# Patient Record
Sex: Female | Born: 1965 | Race: White | Hispanic: No | Marital: Married | Smoking: Never smoker
Health system: Southern US, Community
[De-identification: ages and names within clinical notes are randomized; demographics above are authoritative.]

## PROBLEM LIST (undated history)

## (undated) DIAGNOSIS — J45909 Unspecified asthma, uncomplicated: Secondary | ICD-10-CM

## (undated) DIAGNOSIS — J189 Pneumonia, unspecified organism: Secondary | ICD-10-CM

## (undated) DIAGNOSIS — I82409 Acute embolism and thrombosis of unspecified deep veins of unspecified lower extremity: Secondary | ICD-10-CM

## (undated) DIAGNOSIS — I10 Essential (primary) hypertension: Secondary | ICD-10-CM

## (undated) DIAGNOSIS — E7800 Pure hypercholesterolemia, unspecified: Secondary | ICD-10-CM

## (undated) HISTORY — DX: Essential (primary) hypertension: I10

## (undated) HISTORY — DX: Pneumonia, unspecified organism: J18.9

## (undated) HISTORY — PX: CHOLECYSTECTOMY: SHX55

## (undated) HISTORY — DX: Unspecified asthma, uncomplicated: J45.909

---

## 2019-10-11 ENCOUNTER — Emergency Department (HOSPITAL_COMMUNITY): Payer: No Typology Code available for payment source

## 2019-10-11 ENCOUNTER — Other Ambulatory Visit: Payer: Self-pay

## 2019-10-11 ENCOUNTER — Encounter (HOSPITAL_COMMUNITY): Payer: Self-pay | Admitting: Emergency Medicine

## 2019-10-11 ENCOUNTER — Emergency Department (HOSPITAL_COMMUNITY)
Admission: EM | Admit: 2019-10-11 | Discharge: 2019-10-11 | Disposition: A | Discharge status: Home / Self Care | Payer: No Typology Code available for payment source | Attending: Emergency Medicine | Admitting: Emergency Medicine

## 2019-10-11 DIAGNOSIS — R0602 Shortness of breath: Secondary | ICD-10-CM | POA: Diagnosis not present

## 2019-10-11 HISTORY — DX: Pure hypercholesterolemia, unspecified: E78.00

## 2019-10-11 HISTORY — DX: Acute embolism and thrombosis of unspecified deep veins of unspecified lower extremity: I82.409

## 2019-10-11 LAB — CBC WITH DIFFERENTIAL/PLATELET
Abs Immature Granulocytes: 0.02 10*3/uL (ref 0.00–0.07)
Basophils Absolute: 0.1 10*3/uL (ref 0.0–0.1)
Basophils Relative: 1 %
Eosinophils Absolute: 0.2 10*3/uL (ref 0.0–0.5)
Eosinophils Relative: 2 %
HCT: 42.8 % (ref 36.0–46.0)
Hemoglobin: 13.5 g/dL (ref 12.0–15.0)
Immature Granulocytes: 0 %
Lymphocytes Relative: 40 %
Lymphs Abs: 3.5 10*3/uL (ref 0.7–4.0)
MCH: 28.1 pg (ref 26.0–34.0)
MCHC: 31.5 g/dL (ref 30.0–36.0)
MCV: 89.2 fL (ref 80.0–100.0)
Monocytes Absolute: 0.5 10*3/uL (ref 0.1–1.0)
Monocytes Relative: 6 %
Neutro Abs: 4.5 10*3/uL (ref 1.7–7.7)
Neutrophils Relative %: 51 %
Platelets: 223 10*3/uL (ref 150–400)
RBC: 4.8 MIL/uL (ref 3.87–5.11)
RDW: 15.1 % (ref 11.5–15.5)
WBC: 8.7 10*3/uL (ref 4.0–10.5)
nRBC: 0 % (ref 0.0–0.2)

## 2019-10-11 LAB — BASIC METABOLIC PANEL
Anion gap: 11 (ref 5–15)
BUN: 14 mg/dL (ref 6–20)
CO2: 24 mmol/L (ref 22–32)
Calcium: 9.1 mg/dL (ref 8.9–10.3)
Chloride: 105 mmol/L (ref 98–111)
Creatinine, Ser: 0.73 mg/dL (ref 0.44–1.00)
GFR calc Af Amer: >60 mL/min (ref >60)
GFR calc non Af Amer: >60 mL/min (ref >60)
Glucose, Bld: 102 mg/dL — ABNORMAL HIGH (ref 70–99)
Potassium: 4 mmol/L (ref 3.5–5.1)
Sodium: 140 mmol/L (ref 135–145)

## 2019-10-11 MED ORDER — IOHEXOL 350 MG/ML SOLN
100.0000 mL | Freq: Once | INTRAVENOUS | Status: AC | PRN
  Administered 2019-10-11: 21:00:00 100 mL via INTRAVENOUS

## 2019-10-11 NOTE — ED Triage Notes (Signed)
Pt dx w/ Covid on Dec 22. Dx with DVT x 2 weeks ago being treated with xarelto. Pt sent by pcp today to r/o PE.  Sats in the upper 80's with ambulation for past couple of days.

## 2019-10-11 NOTE — Discharge Instructions (Addendum)
Your CT scan of your chest this evening was negative for evidence of a blood clot.  Please contact your primary provider to arrange a follow-up appointment this week.  Return to the emergency department if you develop any worsening symptoms

## 2019-10-14 NOTE — ED Provider Notes (Signed)
[facility] EMERGENCY DEPARTMENT Provider Note   CSN: [id number] Arrival date & time: 10/11/19  1544     History Chief Complaint  Patient presents with  . Shortness of Breath    Jane Bennett is a 54 y.o. female.  HPI      Jane Bennett is a 54 y.o. female who presents to the Emergency Department at the recommendation of her PCP for evaluation of a possible PE.  Patient states she was diagnosed with Covid on 09/13/2019.  She was diagnosed with a likely DVT 2 weeks ago by her PCP due to pain in her right ankle and lower leg.  She was started on Xarelto which she is taken daily.  She reports that her Covid symptoms are gradually improving, but she continues to have an elevated heart rate and oxygen saturations that dropped into the upper 80s with ambulation.  She has been monitoring her oxygen levels at home with a portable pulse oximeter.  She states that her oxygen saturations decreased briefly with activity, but quickly returned above 90% at rest.  She denies chest pain, shortness of breath at rest, fever, chills, cough, abdominal pain or diarrhea.  Diagnosis of DVT to the right lower extremity was clinical per patient.  She did not have an ultrasound of the extremity.     Past Medical History:  Diagnosis Date  . DVT (deep venous thrombosis) ([place])   . High cholesterol     There are no problems to display for this patient.   Past Surgical History:  Procedure Laterality Date  . CHOLECYSTECTOMY       OB History   No obstetric history on file.     No family history on file.  Social History   Tobacco Use  . Smoking status: Never Smoker  . Smokeless tobacco: Never Used  Substance Use Topics  . Alcohol use: Never  . Drug use: Never    Home Medications Prior to Admission medications   Not on File    Allergies    Bee venom and Sulfa antibiotics  Review of Systems   Review of Systems  Constitutional: Negative for chills, fatigue and fever.  Respiratory:  Positive for shortness of breath (Shortness of breath with exertion only). Negative for cough and wheezing.   Cardiovascular: Negative for chest pain, palpitations and leg swelling.  Gastrointestinal: Negative for abdominal pain, diarrhea, nausea and vomiting.  Genitourinary: Negative for dysuria, flank pain and hematuria.  Musculoskeletal: Negative for arthralgias, back pain, myalgias, neck pain and neck stiffness.  Skin: Negative for rash.  Neurological: Negative for dizziness, weakness, numbness and headaches.  Hematological: Does not bruise/bleed easily.    Physical Exam Updated Vital Signs BP (!) 148/91   Pulse 86   Temp 98.4 F (36.9 C) (Oral)   Resp 18   Ht 5\' 2"  (1.575 m)   Wt 97.5 kg   SpO2 98%   BMI 39.32 kg/m   Physical Exam Vitals and nursing note reviewed.  Constitutional:      Appearance: Normal appearance. She is well-developed. She is not ill-appearing or toxic-appearing.  HENT:     Head: Normocephalic.  Neck:     Meningeal: Kernig's sign absent.  Cardiovascular:     Rate and Rhythm: Normal rate and regular rhythm.     Pulses: Normal pulses.  Pulmonary:     Effort: Pulmonary effort is normal.     Breath sounds: Normal breath sounds. No wheezing.  Chest:     Chest wall: No tenderness.  Abdominal:     Palpations: Abdomen is soft.     Tenderness: There is no abdominal tenderness. There is no guarding or rebound.  Musculoskeletal:        General: Tenderness present. Normal range of motion.     Cervical back: Normal range of motion.     Comments: Patient has focal tenderness to palpation of the lateral right foot.  Right lower leg is nontender to palpation, no erythema, edema, or excessive warmth.  Negative Homans' sign.  Skin:    General: Skin is warm.     Capillary Refill: Capillary refill takes less than 2 seconds.     Findings: No rash.  Neurological:     Mental Status: She is alert and oriented to person, place, and time.     Sensory: No sensory  deficit.     Motor: No weakness.     ED Results / Procedures / Treatments   Labs (all labs ordered are listed, but only abnormal results are displayed) Labs Reviewed  BASIC METABOLIC PANEL - Abnormal; Notable for the following components:      Result Value   Glucose, Bld 102 (*)    All other components within normal limits  CBC WITH DIFFERENTIAL/PLATELET    EKG None  Radiology CT Angio Chest PE W and/or Wo Contrast  Result Date: 10/11/2019 CLINICAL DATA:  Shortness of breath. EXAM: CT ANGIOGRAPHY CHEST WITH CONTRAST TECHNIQUE: Multidetector CT imaging of the chest was performed using the standard protocol during bolus administration of intravenous contrast. Multiplanar CT image reconstructions and MIPs were obtained to evaluate the vascular anatomy. CONTRAST:  OMNIPAQUE IOHEXOL 350 MG/ML SOLN COMPARISON:  None. FINDINGS: Cardiovascular: Satisfactory opacification of the pulmonary arteries to the segmental level. No evidence of pulmonary embolism. Normal heart size. No pericardial effusion. Mediastinum/Nodes: No enlarged mediastinal, hilar, or axillary lymph nodes. Thyroid gland, trachea, and esophagus demonstrate no significant findings. Lungs/Pleura: Lungs are clear. No pleural effusion or pneumothorax. Upper Abdomen: No acute abnormality. Musculoskeletal: No chest wall abnormality. No acute or significant osseous findings. Review of the MIP images confirms the above findings. IMPRESSION: No CT evidence of pulmonary embolism or acute cardiopulmonary disease. Electronically Signed   By: Aram Candela M.D.   On: 10/11/2019 21:28     Procedures Procedures (including critical care time)  Medications Ordered in ED Medications  iohexol (OMNIPAQUE) 350 MG/ML injection 100 mL (100 mLs Intravenous Contrast Given 10/11/19 2116)    ED Course  I have reviewed the triage vital signs and the nursing notes.  Pertinent labs & imaging results that were available during my care of the  patient were reviewed by me and considered in my medical decision making (see chart for details).    MDM Rules/Calculators/A&P                      Patient diagnosed with Covid 19 in December, sent by PCPs office for evaluation of possible PE due to intermittent hypoxia and tachycardia.  Patient has been anticoagulated on Xarelto for 2 weeks.  She is well-appearing.  No hypoxia here.  CT angio of the chest is negative for PE.  Low clinical suspicion for ACS.  Laboratory studies are reassuring.  I feel patient is appropriate for discharge home, she agrees to close follow-up with PCP to discuss need for continued Xarelto use.  Return precautions also discussed.   Final Clinical Impression(s) / ED Diagnoses Final diagnoses:  Shortness of breath    Rx / DC Orders  ED Discharge Orders    None       Pauline Aus, Cordelia Poche 10/14/19 1301    Raeford Razor, MD 10/14/19 1511

## 2019-11-02 ENCOUNTER — Encounter: Payer: Self-pay | Admitting: *Deleted

## 2019-11-02 ENCOUNTER — Ambulatory Visit (INDEPENDENT_AMBULATORY_CARE_PROVIDER_SITE_OTHER): Payer: No Typology Code available for payment source | Admitting: Pulmonary Disease

## 2019-11-02 ENCOUNTER — Encounter: Payer: Self-pay | Admitting: Pulmonary Disease

## 2019-11-02 ENCOUNTER — Other Ambulatory Visit: Payer: Self-pay

## 2019-11-02 VITALS — BP 138/94 | HR 120 | Temp 97.1°F | Ht 61.0 in | Wt 222.0 lb

## 2019-11-02 DIAGNOSIS — Z8616 Personal history of COVID-19: Secondary | ICD-10-CM

## 2019-11-02 DIAGNOSIS — I82409 Acute embolism and thrombosis of unspecified deep veins of unspecified lower extremity: Secondary | ICD-10-CM | POA: Insufficient documentation

## 2019-11-02 DIAGNOSIS — R5381 Other malaise: Secondary | ICD-10-CM | POA: Insufficient documentation

## 2019-11-02 DIAGNOSIS — R062 Wheezing: Secondary | ICD-10-CM

## 2019-11-02 DIAGNOSIS — J410 Simple chronic bronchitis: Secondary | ICD-10-CM | POA: Diagnosis not present

## 2019-11-02 DIAGNOSIS — J4540 Moderate persistent asthma, uncomplicated: Secondary | ICD-10-CM | POA: Insufficient documentation

## 2019-11-02 DIAGNOSIS — I82491 Acute embolism and thrombosis of other specified deep vein of right lower extremity: Secondary | ICD-10-CM | POA: Diagnosis not present

## 2019-11-02 MED ORDER — ADVAIR HFA 230-21 MCG/ACT IN AERO: 2.0000 | INHALATION_SPRAY | Freq: Two times a day (BID) | RESPIRATORY_TRACT | 12 refills | Status: DC

## 2019-11-02 NOTE — Patient Instructions (Addendum)
History of COVID-19 pneumonia Deconditioning --Please send a copy of your COVID-19 test for our office --START Advair 230-21 mcg TWO puffs TWICE a day --Referral to Pulmonary Rehab --Arrange for pulmonary function tests  Presumed right lower extremity DVT on anticoagulation --Order ultrasound   Upper airway wheezing --Referral to ENT  Follow-up after pulmonary function test with NP

## 2019-11-02 NOTE — Addendum Note (Signed)
Addended by: Boone Master E on: 11/02/2019 02:57 PM   Modules accepted: Orders

## 2019-11-02 NOTE — Progress Notes (Signed)
Subjective:   PATIENT ID: Jane Bennett GENDER: female DOB: Sep 14, 1966, MRN: [id number]   HPI  Chief Complaint  Patient presents with  . Consult    wheezy all the time, takes albuterol every 4 hours, no other inhalers    Reason for Visit: New consult for persistent symptoms following covid-19 pneumonia.   Jane Bennett is a 54yo female with PMH pneumonia with bronchitis one year ago presenting for persistent symptoms following covid-19 pneumonia.   She states prior to covid-19 she had recovered completely from her prior pneumonia last year. 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   Since her diagnosis she has constant wheezing, if she walks 100 ft she gets completely short of breath. She has occasional SOB at rest. She has trouble talking as well. Two weeks ago her sats were dropping to the upper 80s on ambulation, but she has not had them checked recently. She wakes up 2 times per night most nights during the week from sputum in her throat and coughing. Cough is mostly dry. She is using her albuterol inhaler every 4 hours and says it helps sometimes. If she is hot her symptoms are much worse. She is unable to lay flat at night, no current LE edema, no chest pain, no palpitations.   Early in January she was clinically diagnosed with DVT. They were unable to do [place] as she was being treated for covid and still symptomatic. She is not sure how long she will be taking it. The swelling in her legs has resolved.   She has not been back to work as she gets so short of breath walking. She was treated with two different antibiotics, azithromycin and then doxycycline, prednisone taper was also tried twice. She thinks the medications helped, her wheezing did go away, but once they were completed she was back to the same symptoms.  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 She was provided with  adequate PPE.   Progress note 12/31 from PCP Alyssa Allwardt PA-C reviewed. She was started on prednisone taper and given one dose of rocephin then azithromycin for suspicion of developing bacterial pneumonia 2/2 to her covid-19.  Progress note from Alyssa Allwardt PA-C reviewed from 10/13/19. She was started on Xarelto 09/28/19 for suspected DVT although no specific note for diagnosis available. Per note she also went to [facility] and had CT chest around this time which was negative for PE. She was started on second prednisone taper at this time and doxycycline. She states she completed both.   ER note from Dr. Raeford Razor 10/11/19 reviewed. She presented to the [facility] ED with concern for PE with increased HR and O2 requirement. CT showed no PE.   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  Never smoker   Environmental exposures: No known exposures   I have personally reviewed patient's past medical/family/social history, allergies, current medications.  Past Medical History:  Diagnosis Date  . DVT (deep venous thrombosis) ([facility])   . High cholesterol   . Pneumonia      Family History  Problem Relation Age of Onset  . Diabetes Mother   . Hypertension Mother   . Hyperlipidemia Mother   . Asthma Mother   . Diabetes Father   . Hypertension Father   . Deep vein thrombosis Father      Social History  Occupational History  . Not on file  Tobacco Use  . Smoking status: Never Smoker  . Smokeless tobacco: Never Used  Substance and Sexual Activity  . Alcohol use: Never  . Drug use: Never  . Sexual activity: Not on file    Allergies  Allergen Reactions  . Bee Venom Hives  . Sulfa Antibiotics Hives     Outpatient Medications Prior to Visit  Medication Sig Dispense Refill  . rivaroxaban (XARELTO) 20 MG TABS tablet Take 20 mg by mouth daily with supper.    Marland Kitchen albuterol (VENTOLIN HFA) 108 (90 Base) MCG/ACT inhaler INHALE 1 TO 2 PUFFS BY MOUTH EVERY  4 TO 6 HOURS    . ondansetron (ZOFRAN-ODT) 8 MG disintegrating tablet Take 8 mg by mouth every 6 (six) hours as needed.     No facility-administered medications prior to visit.    Review of Systems  Constitutional: Positive for malaise/fatigue. Negative for chills, fever and weight loss.  HENT: Positive for sore throat. Negative for congestion and sinus pain.   Respiratory: Positive for cough, shortness of breath and wheezing. Negative for hemoptysis and sputum production.   Cardiovascular: Negative for chest pain, palpitations and leg swelling.  Gastrointestinal: Negative for abdominal pain and nausea.  Neurological: Negative for dizziness, weakness and headaches.   Objective:   Vitals:   11/02/19 0942  BP: (!) 138/94  Pulse: (!) 120  Temp: (!) 97.1 F (36.2 C)  TempSrc: Temporal  SpO2: 97%  Weight: 222 lb (100.7 kg)  Height: 5\' 1"  (1.549 m)   SpO2: 97 % O2 Device: None (Room air)  Physical Exam: General: no acute distress, obese HENT: , AT, OP clear, MMM Eyes: EOMI, no scleral icterus Respiratory: Audible wheezing, bilateral wheezing, no crackles or rales Cardiovascular: RRR, -M/R/G, no JVD Extremities:-Edema,-tenderness Neuro: AAO x4, CNII-XII grossly intact Skin: Intact, no rashes or bruising Psych: Normal mood, normal affect  Data Reviewed:  Imaging: CTA 1/19 2021: Negative for pulmonary embolism. Faint bilateral patchy groundglass opacities without evidence of interstitial thickening, effusion or edema.  PFT: None on file    Ambulatory O2 11/02/19  11/02/19    1036  Resting   Supplemental oxygen during test? No  Resting Heart Rate 105  Resting Sp02 97  Lap 1 (250 feet)   HR 121  02 Sat 95  Lap 2 (250 feet)   HR --  02 Sat --  Lap 3 (250 feet)   HR --  02 Sat --  Tech Comments: Patient walked at an average pace only able to complete one lap. Patient stopped three times while doing the lap to cough and to catch her breath. Patient had  complaints of severe SOB and chest tightness and also wheezed the entire time while walking.    Assessment & Plan:  Discussion: Jane Bennett is a 54yo previously healthy female. She is a never smoker with hx of pneumonia/bronchitis last year status post complete resolution. She presents to Pulmonary clinic for evaluation of persistent dyspnea on exertion and wheezing following diagnosis of COVID-19 pneumonia in 08/2019. She has completed two steroid tapers and been treated with antibiotics twice without resolution of symptoms. Her symptoms limit her activities of daily living. CT chest reviewed as above. She was also diagnosed with DVT clinically, thought to be secondary to covid-19, and is currently on Xarelto.   Of note, patient arrived with case manager involved in worker's comp case. Patient declined having case manager during interview however allowed representative to be present during assessment  and plan.  Chronic Bronchitis  Covid-19 Persistent symptoms Ambulate with O2 sats PFTs end of March Follow-up after PFTs Refer to pulmonary rehab  Refer to ENT with upper airway wheezing Start ICS/LABA  DVT  Perform LE doppler    Health Maintenance Immunization History  Administered Date(s) Administered  . Influenza Whole 07/04/2019   CT Lung Screen - na  Orders Placed This Encounter  Procedures  . Ambulatory referral to ENT    Referral Priority:   Routine    Referral Type:   Consultation    Referral Reason:   Specialty Services Required    Requested Specialty:   Otolaryngology    Number of Visits Requested:   1  . AMB referral to pulmonary rehabilitation    Referral Priority:   Routine    Referral Type:   Consultation    Number of Visits Requested:   1  . Pulmonary function test    Standing Status:   Future    Standing Expiration Date:   11/01/2020    Order Specific Question:   Where should this test be performed?    Answer:   [facility]    Order Specific Question:    Full PFT: includes the following: basic spirometry, spirometry pre & post bronchodilator, diffusion capacity (DLCO), lung volumes    Answer:   Full PFT   Meds ordered this encounter  Medications  . fluticasone-salmeterol (ADVAIR HFA) 230-21 MCG/ACT inhaler    Sig: Inhale 2 puffs into the lungs 2 (two) times daily.    Dispense:  1 Inhaler    Refill:  12    Return for after PFTs with NP.  Marty Heck, MD [facility] 11/02/2019 10:32 AM  Office Number [phone]  Attending Attestation I have taken an interval history, reviewed the chart and examined the patient myself. I have discussed patient's case and agree with the resident's note, impression, and recommendations as outlined.  I have independently spent a total time of 60-minutes related to patient care.  54 year female never smoker with hx of bronchitis/pneumonia last year but usually in general healthy. No hx of childhood asthma/respiratory illness. 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 She wears gloves, gowns, faceshields and X52W per work policy since the pandemic started. 09/12/20 She reports +COVID at work. She was asymptomatic. Two days following she developed multitude of GI and respiratory symptoms and still has persistent DOE and wheezing which limit her ADLs.  She was clinically diagnosed with LLE DVT and started on anticoagulation January 6th. Denies bleeding or wheezing. On my exam, wheezing in upper airways, diminished airway entry with no rhonchi in lung fields. Cardiac exam RRR, no murmur or pedal edema/tenderness.  Assessment/Plan  History of COVID-19 pneumonia Deconditioning --Please send a copy of your COVID-19 test for our office --START Advair 230-21 mcg TWO puffs TWICE a day --Referral to Pulmonary Rehab --Arrange for pulmonary function tests  Presumed right lower extremity DVT on anticoagulation --Order ultrasound   Upper airway wheezing --Referral  to ENT  Rodman Pickle, M.D. [facility] Pulmonary/Critical Care Medicine 11/02/2019 10:56 AM

## 2019-11-04 ENCOUNTER — Other Ambulatory Visit: Payer: Self-pay

## 2019-11-04 ENCOUNTER — Ambulatory Visit (HOSPITAL_COMMUNITY)
Admission: RE | Admit: 2019-11-04 | Discharge: 2019-11-04 | Disposition: A | Discharge status: Home / Self Care | Payer: No Typology Code available for payment source | Source: Ambulatory Visit | Attending: Pulmonary Disease | Admitting: Pulmonary Disease

## 2019-11-04 DIAGNOSIS — I82491 Acute embolism and thrombosis of other specified deep vein of right lower extremity: Secondary | ICD-10-CM | POA: Diagnosis present

## 2019-11-04 NOTE — Progress Notes (Signed)
I called patient regarding negative right lower extremity doppler. This study along with the CTA on 10/11/19 is negative for PE/DVT. I advised patient to discontinue xarelto due to increased risk of bleeding. Patient expressed understanding and agreed to plan.  Please fax this note to patient's PCP office and doppler results.  Mechele Collin, M.D. [facility] Pulmonary/Critical Care Medicine 11/04/2019 4:20 PM

## 2019-11-16 ENCOUNTER — Other Ambulatory Visit: Payer: Self-pay

## 2019-11-16 ENCOUNTER — Encounter (HOSPITAL_COMMUNITY): Payer: Self-pay

## 2019-11-16 ENCOUNTER — Encounter (HOSPITAL_COMMUNITY)
Admission: RE | Admit: 2019-11-16 | Discharge: 2019-11-16 | Disposition: A | Discharge status: Home / Self Care | Payer: No Typology Code available for payment source | Source: Ambulatory Visit | Attending: Pulmonary Disease | Admitting: Pulmonary Disease

## 2019-11-16 VITALS — BP 170/98 | HR 83 | Temp 96.6°F | Ht 62.0 in | Wt 224.5 lb

## 2019-11-16 DIAGNOSIS — R0602 Shortness of breath: Secondary | ICD-10-CM | POA: Diagnosis present

## 2019-11-16 DIAGNOSIS — J410 Simple chronic bronchitis: Secondary | ICD-10-CM | POA: Insufficient documentation

## 2019-11-16 NOTE — Progress Notes (Signed)
Daily Session Note  Patient Details  Name: Jane Bennett MRN: [id number] Date of Birth: Jan 22, 1966 Referring Provider:     PULMONARY REHAB OTHER RESP ORIENTATION from 11/16/2019 in [place]  Referring Provider  Dr. Loanne Drilling      Encounter Date: 11/16/2019  Check In: Session Check In - 11/16/19 0800      Check-In   Supervising physician immediately available to respond to emergencies  See telemetry face sheet for immediately available MD    Location  [facility]    Staff Present  [name], MS, EP,  [facility], Exercise Physiologist;Debra [name], RN, BSN    Virtual Visit  No    Medication changes reported      No    Fall or balance concerns reported     No    Tobacco Cessation  No Change    Warm-up and Cool-down  Performed as group-led instruction    Resistance Training Performed  Yes    VAD Patient?  No    PAD/SET Patient?  No      Pain Assessment   Currently in Pain?  No/denies    Pain Score  0-No pain    Multiple Pain Sites  No       Capillary Blood Glucose: No results found for this or any previous visit (from the past 24 hour(s)).    Social History   Tobacco Use  Smoking Status Never Smoker  Smokeless Tobacco Never Used    Goals Met:  Proper associated with RPD/PD & O2 Sat Independence with exercise equipment Exercise tolerated well Personal goals reviewed Queuing for purse lip breathing No report of cardiac concerns or symptoms Strength training completed today  Goals Unmet:  Not Applicable  Comments: Check out 10:15   Dr. Kate Sable is Medical Director for [place] and Pulmonary Rehab.

## 2019-11-16 NOTE — Progress Notes (Signed)
Pulmonary Individual Treatment Plan  Patient Details  Name: Jane Bennett MRN: [id number] Date of Birth: 1966/07/19 Referring Provider:     PULMONARY REHAB OTHER RESP ORIENTATION from 11/16/2019 in [facility] CARDIAC REHABILITATION  Referring Provider  Dr. Everardo All      Initial Encounter Date:    PULMONARY REHAB OTHER RESP ORIENTATION from 11/16/2019 in [facility] CARDIAC REHABILITATION  Date  11/16/19      Visit Diagnosis: SOB (shortness of breath)  Simple chronic bronchitis (HCC)  Patient's Home Medications on Admission:   Current Outpatient Medications:  .  albuterol (VENTOLIN HFA) 108 (90 Base) MCG/ACT inhaler, INHALE 1 TO 2 PUFFS BY MOUTH EVERY 4 TO 6 HOURS, Disp: , Rfl:  .  fluticasone-salmeterol (ADVAIR HFA) 230-21 MCG/ACT inhaler, Inhale 2 puffs into the lungs 2 (two) times daily., Disp: 1 Inhaler, Rfl: 12 .  ondansetron (ZOFRAN-ODT) 8 MG disintegrating tablet, Take 8 mg by mouth every 6 (six) hours as needed., Disp: , Rfl:  .  rivaroxaban (XARELTO) 20 MG TABS tablet, Take 20 mg by mouth daily with supper., Disp: , Rfl:   Past Medical History: Past Medical History:  Diagnosis Date  . DVT (deep venous thrombosis) (HCC)   . High cholesterol   . Pneumonia     Tobacco Use: Social History   Tobacco Use  Smoking Status Never Smoker  Smokeless Tobacco Never Used    Labs: Recent Review Flowsheet Data    There is no flowsheet data to display.      Capillary Blood Glucose: No results found for: GLUCAP   Pulmonary Assessment Scores: Pulmonary Assessment Scores    Row Name 11/16/19 1117         ADL UCSD   ADL Phase  Entry     SOB Score total  90     Rest  0     Walk  11     Stairs  5     Bath  4     Dress  5     Shop  5       CAT Score   CAT Score  33       mMRC Score   mMRC Score  4       UCSD: Self-administered rating of dyspnea associated with activities of daily living (ADLs) 6-point scale (0 = "not at all" to 5 = "maximal or unable to do  because of breathlessness")  Scoring Scores range from 0 to 120.  Minimally important difference is 5 units  CAT: CAT can identify the health impairment of COPD patients and is better correlated with disease progression.  CAT has a scoring range of zero to 40. The CAT score is classified into four groups of low (less than 10), medium (10 - 20), high (21-30) and very high (31-40) based on the impact level of disease on health status. A CAT score over 10 suggests significant symptoms.  A worsening CAT score could be explained by an exacerbation, poor medication adherence, poor inhaler technique, or progression of COPD or comorbid conditions.  CAT MCID is 2 points  mMRC: mMRC (Modified Medical Research Council) Dyspnea Scale is used to assess the degree of baseline functional disability in patients of respiratory disease due to dyspnea. No minimal important difference is established. A decrease in score of 1 point or greater is considered a positive change.   Pulmonary Function Assessment:   Exercise Target Goals: Exercise Program Goal: Individual exercise prescription set using results from initial 6 min walk  test and THRR while considering  patient's activity barriers and safety.   Exercise Prescription Goal: Initial exercise prescription builds to 30-45 minutes a day of aerobic activity, 2-3 days per week.  Home exercise guidelines will be given to patient during program as part of exercise prescription that the participant will acknowledge.  Activity Barriers & Risk Stratification: Activity Barriers & Cardiac Risk Stratification - 11/16/19 1103      Activity Barriers & Cardiac Risk Stratification   Activity Barriers  Shortness of Breath    Cardiac Risk Stratification  Moderate       6 Minute Walk: 6 Minute Walk    Row Name 11/16/19 1058         6 Minute Walk   Phase  Initial     Distance  700 feet     Walk Time  6 minutes     # of Rest Breaks  1     MPH  1.32     METS   2.01     RPE  14     Perceived Dyspnea   16     VO2 Peak  0.56     Symptoms  Yes (comment)     Comments  Extreme SOB. Had to stop to rest for 2 minutes then was able to finish the test.     Resting HR  83 bpm     Resting BP  170/98     Resting Oxygen Saturation   97 %     Exercise Oxygen Saturation  during 6 min walk  96 %     Max Ex. HR  107 bpm     Max Ex. BP  198/120     2 Minute Post BP  158/98        Oxygen Initial Assessment: Oxygen Initial Assessment - 11/16/19 1116      Home Oxygen   Home Oxygen Device  None    Sleep Oxygen Prescription  None    Home Exercise Oxygen Prescription  None    Home at Rest Exercise Oxygen Prescription  None      Initial 6 min Walk   Oxygen Used  None      Program Oxygen Prescription   Program Oxygen Prescription  None       Oxygen Re-Evaluation:   Oxygen Discharge (Final Oxygen Re-Evaluation):   Initial Exercise Prescription: Initial Exercise Prescription - 11/16/19 1100      Date of Initial Exercise RX and Referring Provider   Date  11/16/19    Referring Provider  Dr. Everardo All    Expected Discharge Date  02/13/20      Arm Ergometer   Level  1    Watts  7    RPM  30    Minutes  17    METs  1.5      T5 Nustep   Level  1    SPM  49    Minutes  22    METs  1.8      Prescription Details   Frequency (times per week)  2    Duration  Progress to 30 minutes of continuous aerobic without signs/symptoms of physical distress      Intensity   THRR 40-80% of Max Heartrate  97-133-150    Ratings of Perceived Exertion  11-13    Perceived Dyspnea  0-4      Progression   Progression  Continue to progress workloads to maintain intensity without signs/symptoms of physical distress.  Resistance Training   Training Prescription  Yes    Weight  1    Reps  10-15       Perform Capillary Blood Glucose checks as needed.  Exercise Prescription Changes:   Exercise Comments:  Exercise Comments    Row Name 11/16/19 1109            Exercise Comments  Today was patients inital assessment/orientation. She did well considering her residual effects of SOB and Wheezing post COVID-19. She is eager and wants to attend so that she can improve and decrease her SOB.          Exercise Goals and Review:  Exercise Goals    Row Name 11/16/19 1108             Exercise Goals   Increase Physical Activity  Yes       Intervention  Provide advice, education, support and counseling about physical activity/exercise needs.;Develop an individualized exercise prescription for aerobic and resistive training based on initial evaluation findings, risk stratification, comorbidities and participant's personal goals.       Expected Outcomes  Short Term: Attend rehab on a regular basis to increase amount of physical activity.;Long Term: Add in home exercise to make exercise part of routine and to increase amount of physical activity.       Increase Strength and Stamina  Yes       Intervention  Provide advice, education, support and counseling about physical activity/exercise needs.;Develop an individualized exercise prescription for aerobic and resistive training based on initial evaluation findings, risk stratification, comorbidities and participant's personal goals.       Expected Outcomes  Short Term: Perform resistance training exercises routinely during rehab and add in resistance training at home;Long Term: Improve cardiorespiratory fitness, muscular endurance and strength as measured by increased METs and functional capacity ( )       Able to understand and use rate of perceived exertion (RPE) scale  Yes       Intervention  Provide education and explanation on how to use RPE scale       Expected Outcomes  Short Term: Able to use RPE daily in rehab to express subjective intensity level;Long Term:  Able to use RPE to guide intensity level when exercising independently       Able to understand and use Dyspnea scale  Yes        Intervention  Provide education and explanation on how to use Dyspnea scale       Expected Outcomes  Short Term: Able to use Dyspnea scale daily in rehab to express subjective sense of shortness of breath during exertion;Long Term: Able to use Dyspnea scale to guide intensity level when exercising independently       Knowledge and understanding of Target Heart Rate Range (THRR)  Yes       Intervention  Provide education and explanation of THRR including how the numbers were predicted and where they are located for reference       Expected Outcomes  Short Term: Able to use daily as guideline for intensity in rehab;Long Term: Able to use THRR to govern intensity when exercising independently       Able to check pulse independently  Yes       Intervention  Provide education and demonstration on how to check pulse in carotid and radial arteries.;Review the importance of being able to check your own pulse for safety during independent exercise       Expected Outcomes  Short Term: Able to explain why pulse checking is important during independent exercise;Long Term: Able to check pulse independently and accurately       Understanding of Exercise Prescription  Yes       Intervention  Provide education, explanation, and written materials on patient's individual exercise prescription       Expected Outcomes  Short Term: Able to explain program exercise prescription;Long Term: Able to explain home exercise prescription to exercise independently          Exercise Goals Re-Evaluation :   Discharge Exercise Prescription (Final Exercise Prescription Changes):   Nutrition:  Target Goals: Understanding of nutrition guidelines, daily intake of sodium 1500mg , cholesterol 200mg , calories 30% from fat and 7% or less from saturated fats, daily to have 5 or more servings of fruits and vegetables.  Biometrics: Pre Biometrics - 11/16/19 1111      Pre Biometrics   Height  5\' 2"  (1.575 m)    Weight  224 lb 8 oz  (101.8 kg)    Waist Circumference  44 inches    Hip Circumference  45.5 inches    Waist to Hip Ratio  0.97 %    BMI (Calculated)  41.05    Triceps Skinfold  26 mm    % Body Fat  48.4 %    Grip Strength  20.8 kg    Flexibility  7.6 in    Single Leg Stand  25 seconds        Nutrition Therapy Plan and Nutrition Goals: Nutrition Therapy & Goals - 11/16/19 1123      Nutrition Therapy   RD appointment deferred  Yes      Personal Nutrition Goals   Personal Goal #2  Patient is eating heart healthy. She eats very little meat. She eats more fruits and vegetables.    Additional Goals?  No      Intervention Plan   Intervention  Nutrition handout(s) given to patient.       Nutrition Assessments: Nutrition Assessments - 11/16/19 1124      MEDFICTS Scores   Pre Score  33       Nutrition Goals Re-Evaluation:   Nutrition Goals Discharge (Final Nutrition Goals Re-Evaluation):   Psychosocial: Target Goals: Acknowledge presence or absence of significant depression and/or stress, maximize coping skills, provide positive support system. Participant is able to verbalize types and ability to use techniques and skills needed for reducing stress and depression.  Initial Review & Psychosocial Screening: Initial Psych Review & Screening - 11/16/19 1120      Initial Review   Current issues with  Current Anxiety/Panic   Due to not being able to breath post [place]?  Yes      Barriers   Psychosocial barriers to participate in program  There are no identifiable barriers or psychosocial needs.      Screening Interventions   Interventions  Encouraged to exercise       Quality of Life Scores: Quality of Life - 11/16/19 1121      Quality of Life   Select  Quality of Life      Quality of Life Scores   Health/Function Pre  20.88 %    Socioeconomic Pre  20.25 %    Psych/Spiritual Pre  20.86 %    Family Pre  20.8 %    GLOBAL Pre  20.72 %       Scores of 19 and below usually  indicate a poorer quality of life in these areas.  A difference of  2-3 points is a clinically meaningful difference.  A difference of 2-3 points in the total score of the Quality of Life Index has been associated with significant improvement in overall quality of life, self-image, physical symptoms, and general health in studies assessing change in quality of life.   PHQ-9: Recent Review Flowsheet Data    Depression screen [facility] 2/9 11/16/2019   Decreased Interest 0   Down, Depressed, Hopeless 0   PHQ - 2 Score 0   Altered sleeping 1   Tired, decreased energy 2   Change in appetite 1   Feeling bad or failure about yourself  0   Trouble concentrating 0   Moving slowly or fidgety/restless 0   Suicidal thoughts 0   PHQ-9 Score 4   Difficult doing work/chores Somewhat difficult     Interpretation of Total Score  Total Score Depression Severity:  1-4 = Minimal depression, 5-9 = Mild depression, 10-14 = Moderate depression, 15-19 = Moderately severe depression, 20-27 = Severe depression   Psychosocial Evaluation and Intervention: Psychosocial Evaluation - 11/16/19 1121      Psychosocial Evaluation & Interventions   Interventions  Encouraged to exercise with the program and follow exercise prescription    Continue Psychosocial Services   No Follow up required       Psychosocial Re-Evaluation:   Psychosocial Discharge (Final Psychosocial Re-Evaluation):    Education: Education Goals: Education classes will be provided on a weekly basis, covering required topics. Participant will state understanding/return demonstration of topics presented.  Learning Barriers/Preferences: Learning Barriers/Preferences - 11/16/19 1004      Learning Barriers/Preferences   Learning Barriers  None    Learning Preferences  Pictoral;Group Instruction;Video;Individual Instruction;Skilled Demonstration       Education Topics: How Lungs Work and Diseases: -  Discuss the anatomy of the lungs and diseases that can affect the lungs, such as COPD.   Exercise: -Discuss the importance of exercise, FITT principles of exercise, normal and abnormal responses to exercise, and how to exercise safely.   Environmental Irritants: -Discuss types of environmental irritants and how to limit exposure to environmental irritants.   Meds/Inhalers and oxygen: - Discuss respiratory medications, definition of an inhaler and oxygen, and the proper way to use an inhaler and oxygen.   Energy Saving Techniques: - Discuss methods to conserve energy and decrease shortness of breath when performing activities of daily living.    Bronchial Hygiene / Breathing Techniques: - Discuss breathing mechanics, pursed-lip breathing technique,  proper posture, effective ways to clear airways, and other functional breathing techniques   Cleaning Equipment: - Provides group verbal and written instruction about the health risks of elevated stress, cause of high stress, and healthy ways to reduce stress.   Nutrition I: Fats: - Discuss the types of cholesterol, what cholesterol does to the body, and how cholesterol levels can be controlled.   Nutrition II: Labels: -Discuss the different components of food labels and how to read food labels.   Respiratory Infections: - Discuss the signs and symptoms of respiratory infections, ways to prevent respiratory infections, and the importance of seeking medical treatment when having a respiratory infection.   Stress I: Signs and Symptoms: - Discuss the causes of stress, how stress may lead to anxiety and depression, and ways to limit stress.   Stress II: Relaxation: -Discuss relaxation techniques to limit stress.   Oxygen for Home/Travel: - Discuss how to prepare for travel when  on oxygen and proper ways to transport and store oxygen to ensure safety.   Knowledge Questionnaire Score: Knowledge Questionnaire Score - 11/16/19  1005      Knowledge Questionnaire Score   Pre Score  15/18       Core Components/Risk Factors/Patient Goals at Admission: Personal Goals and Risk Factors at Admission - 11/16/19 1125      Core Components/Risk Factors/Patient Goals on Admission    Weight Management  Weight Maintenance    Personal Goal Other  Yes    Personal Goal  Breath better, Be able to get back to normal ADL's, Get back to her daily life and to be able to return to work.    Intervention  Attend program 2 x week and to supplement with 3 x week home exercise plan.    Expected Outcomes  Reach expected goals.       Core Components/Risk Factors/Patient Goals Review:    Core Components/Risk Factors/Patient Goals at Discharge (Final Review):    ITP Comments: ITP Comments    Row Name 11/16/19 1027           ITP Comments  Patient is coming to use post COVID-19 with pneumonia which has left her extremely SOB on exertion. Patient is eager to get started.          Comments: Patient arrived for 1st visit/orientation/education at 0800.  Patient was referred to PR by Dr. Everardo AllEllison due to SOB/Wheezing (R06.2) and Chronic Bronchitis (J41.0). During orientation advised patient on arrival and appointment times what to wear, what to do before, during and after exercise. Reviewed attendance and class policy. Talked about inclement weather and class consultation policy. Pt is scheduled to return Pulmonary Rehab on 11/22/2019 at 1:30. Pt was advised to come to class 15 minutes before class starts. Patient was also given instructions on meeting with the dietician and attending the Family Structure classes. Discussed RPE/Dpysnea scales. Discussed initial THR and how to find their radial and/or carotid pulse. Discussed the initial exercise prescription and how this effects their progress. Pt is eager to get started. Patient participated in warm up stretches followed by light weights and resistance bands. Patient was able to complete 6 minute  walk test. Patient had to stop and rest one time during the walk test for 2 minutes. Patient did not c/o pain at all.  Patient was measured for the equipment. Discussed equipment safety with patient. Took patient pre-anthropometric measurements. Patient finished visit at 10:15.

## 2019-11-16 NOTE — Progress Notes (Signed)
Cardiac/Pulmonary Rehab Medication Review by a Pharmacist  Does the patient  feel that his/her medications are working for him/her?  yes  Has the patient been experiencing any side effects to the medications prescribed?  no  Does the patient measure his/her own blood pressure or blood glucose at home?  no   Does the patient have any problems obtaining medications due to transportation or finances?   no  Understanding of regimen: excellent Understanding of indications: excellent Potential of compliance: excellent  Questions asked to Determine Patient Understanding of Medication Regimen:  1. What is the name of the medication?  2. What is the medication used for?  3. When should it be taken?  4. How much should be taken?  5. How will you take it?  6. What side effects should you report?  Understanding Defined as: Excellent: All questions above are correct Good: Questions 1-4 are correct Fair: Questions 1-2 are correct  Poor: 1 or none of the above questions are correct   Pharmacist comments: Jane Bennett presented today for pulmonary rehab.She is recovered  COVID patient from Sep 12, 2020. She had a clinically diagnosed DVT.  The DVT is resolved and treatment is complete with he was treated for and  resolved post repeat US. We reviewed her medications. She is tolerating the [facility] without any problems. Just ready to get Bennett to normal, but she is worried due to the scarring on the xrays.  Thanks for the opportunity to participate in the care of this patient,  Jane Bennett, Jane Bennett, [place] Clinical Pharmacist Pager [phone] 11/16/2019 8:45 AM

## 2019-11-22 ENCOUNTER — Other Ambulatory Visit: Payer: Self-pay

## 2019-11-22 ENCOUNTER — Encounter (HOSPITAL_COMMUNITY)
Admission: RE | Admit: 2019-11-22 | Discharge: 2019-11-22 | Disposition: A | Discharge status: Home / Self Care | Payer: No Typology Code available for payment source | Source: Ambulatory Visit | Attending: Pulmonary Disease | Admitting: Pulmonary Disease

## 2019-11-22 DIAGNOSIS — J410 Simple chronic bronchitis: Secondary | ICD-10-CM | POA: Insufficient documentation

## 2019-11-22 DIAGNOSIS — R0602 Shortness of breath: Secondary | ICD-10-CM | POA: Insufficient documentation

## 2019-11-22 NOTE — Progress Notes (Signed)
Daily Session Note  Patient Details  Name: Jane Bennett MRN: [id number] Date of Birth: Dec 16, 1965 Referring Provider:     PULMONARY REHAB OTHER RESP ORIENTATION from 11/16/2019 in [employer]  Referring Provider  Dr. Loanne Drilling      Encounter Date: 11/22/2019  Check In: Session Check In - 11/22/19 1330      Check-In   Supervising physician immediately available to respond to emergencies  See telemetry face sheet for immediately available MD    Location  [facility]    Staff Present  [name], MS, EP, [facility], Exercise Physiologist;Other    Virtual Visit  No    Medication changes reported      No    Fall or balance concerns reported     No    Tobacco Cessation  No Change    Warm-up and Cool-down  Performed as group-led instruction    Resistance Training Performed  Yes    VAD Patient?  No    PAD/SET Patient?  No      Pain Assessment   Currently in Pain?  No/denies    Pain Score  0-No pain    Multiple Pain Sites  No       Capillary Blood Glucose: No results found for this or any previous visit (from the past 24 hour(s)).    Social History   Tobacco Use  Smoking Status Never Smoker  Smokeless Tobacco Never Used    Goals Met:  Proper associated with RPD/PD & O2 Sat Independence with exercise equipment Improved SOB with ADL's Using PLB without cueing & demonstrates good technique Exercise tolerated well Personal goals reviewed No report of cardiac concerns or symptoms Strength training completed today  Goals Unmet:  Not Applicable  Comments: check out 14:30   Dr. Kate Sable is Medical Director for [facility] and Pulmonary Rehab.

## 2019-11-24 ENCOUNTER — Other Ambulatory Visit: Payer: Self-pay

## 2019-11-24 ENCOUNTER — Encounter (HOSPITAL_COMMUNITY)
Admission: RE | Admit: 2019-11-24 | Discharge: 2019-11-24 | Disposition: A | Discharge status: Home / Self Care | Payer: No Typology Code available for payment source | Source: Ambulatory Visit | Attending: Pulmonary Disease | Admitting: Pulmonary Disease

## 2019-11-24 DIAGNOSIS — R0602 Shortness of breath: Secondary | ICD-10-CM

## 2019-11-24 DIAGNOSIS — J410 Simple chronic bronchitis: Secondary | ICD-10-CM

## 2019-11-24 NOTE — Progress Notes (Signed)
Daily Session Note  Patient Details  Name: Jane Bennett MRN: [id number] Date of Birth: 1966/04/27 Referring Provider:     PULMONARY REHAB OTHER RESP ORIENTATION from 11/16/2019 in [place]  Referring Provider  Dr. Loanne Drilling      Encounter Date: 11/24/2019  Check In: Session Check In - 11/24/19 1330      Check-In   Supervising physician immediately available to respond to emergencies  See telemetry face sheet for immediately available MD    Location  [facility]    Staff Present  [name], MS, EP, [facility], Exercise Physiologist;Amerika Nourse [name], RN, BSN    Virtual Visit  No    Medication changes reported      No    Fall or balance concerns reported     No    Tobacco Cessation  No Change    Warm-up and Cool-down  Performed as group-led instruction    Resistance Training Performed  Yes    VAD Patient?  No    PAD/SET Patient?  No      Pain Assessment   Currently in Pain?  No/denies    Pain Score  0-No pain    Multiple Pain Sites  No       Capillary Blood Glucose: No results found for this or any previous visit (from the past 24 hour(s)).    Social History   Tobacco Use  Smoking Status Never Smoker  Smokeless Tobacco Never Used    Goals Met:  Proper associated with RPD/PD & O2 Sat Independence with exercise equipment Improved SOB with ADL's Using PLB without cueing & demonstrates good technique Exercise tolerated well No report of cardiac concerns or symptoms Strength training completed today  Goals Unmet:  Not Applicable  Comments: Check out 1430.   Dr. Kate Sable is Medical Director for [facility] Cardiac and Pulmonary Rehab.

## 2019-11-24 NOTE — Progress Notes (Signed)
Pulmonary Individual Treatment Plan  Patient Details  Name: Jane Bennett MRN: [id number] Date of Birth: 1966/07/19 Referring Provider:     PULMONARY REHAB OTHER RESP ORIENTATION from 11/16/2019 in [facility] CARDIAC REHABILITATION  Referring Provider  Dr. Everardo All      Initial Encounter Date:    PULMONARY REHAB OTHER RESP ORIENTATION from 11/16/2019 in [facility] CARDIAC REHABILITATION  Date  11/16/19      Visit Diagnosis: SOB (shortness of breath)  Simple chronic bronchitis (HCC)  Patient's Home Medications on Admission:   Current Outpatient Medications:  .  albuterol (VENTOLIN HFA) 108 (90 Base) MCG/ACT inhaler, INHALE 1 TO 2 PUFFS BY MOUTH EVERY 4 TO 6 HOURS, Disp: , Rfl:  .  fluticasone-salmeterol (ADVAIR HFA) 230-21 MCG/ACT inhaler, Inhale 2 puffs into the lungs 2 (two) times daily., Disp: 1 Inhaler, Rfl: 12 .  ondansetron (ZOFRAN-ODT) 8 MG disintegrating tablet, Take 8 mg by mouth every 6 (six) hours as needed., Disp: , Rfl:  .  rivaroxaban (XARELTO) 20 MG TABS tablet, Take 20 mg by mouth daily with supper., Disp: , Rfl:   Past Medical History: Past Medical History:  Diagnosis Date  . DVT (deep venous thrombosis) (HCC)   . High cholesterol   . Pneumonia     Tobacco Use: Social History   Tobacco Use  Smoking Status Never Smoker  Smokeless Tobacco Never Used    Labs: Recent Review Flowsheet Data    There is no flowsheet data to display.      Capillary Blood Glucose: No results found for: GLUCAP   Pulmonary Assessment Scores: Pulmonary Assessment Scores    Row Name 11/16/19 1117         ADL UCSD   ADL Phase  Entry     SOB Score total  90     Rest  0     Walk  11     Stairs  5     Bath  4     Dress  5     Shop  5       CAT Score   CAT Score  33       mMRC Score   mMRC Score  4       UCSD: Self-administered rating of dyspnea associated with activities of daily living (ADLs) 6-point scale (0 = "not at all" to 5 = "maximal or unable to do  because of breathlessness")  Scoring Scores range from 0 to 120.  Minimally important difference is 5 units  CAT: CAT can identify the health impairment of COPD patients and is better correlated with disease progression.  CAT has a scoring range of zero to 40. The CAT score is classified into four groups of low (less than 10), medium (10 - 20), high (21-30) and very high (31-40) based on the impact level of disease on health status. A CAT score over 10 suggests significant symptoms.  A worsening CAT score could be explained by an exacerbation, poor medication adherence, poor inhaler technique, or progression of COPD or comorbid conditions.  CAT MCID is 2 points  mMRC: mMRC (Modified Medical Research Council) Dyspnea Scale is used to assess the degree of baseline functional disability in patients of respiratory disease due to dyspnea. No minimal important difference is established. A decrease in score of 1 point or greater is considered a positive change.   Pulmonary Function Assessment:   Exercise Target Goals: Exercise Program Goal: Individual exercise prescription set using results from initial 6 min walk  test and THRR while considering  patient's activity barriers and safety.   Exercise Prescription Goal: Initial exercise prescription builds to 30-45 minutes a day of aerobic activity, 2-3 days per week.  Home exercise guidelines will be given to patient during program as part of exercise prescription that the participant will acknowledge.  Activity Barriers & Risk Stratification: Activity Barriers & Cardiac Risk Stratification - 11/16/19 1103      Activity Barriers & Cardiac Risk Stratification   Activity Barriers  Shortness of Breath    Cardiac Risk Stratification  Moderate       6 Minute Walk: 6 Minute Walk    Row Name 11/16/19 1058         6 Minute Walk   Phase  Initial     Distance  700 feet     Walk Time  6 minutes     # of Rest Breaks  1     MPH  1.32     METS   2.01     RPE  14     Perceived Dyspnea   16     VO2 Peak  0.56     Symptoms  Yes (comment)     Comments  Extreme SOB. Had to stop to rest for 2 minutes then was able to finish the test.     Resting HR  83 bpm     Resting BP  170/98     Resting Oxygen Saturation   97 %     Exercise Oxygen Saturation  during 6 min walk  96 %     Max Ex. HR  107 bpm     Max Ex. BP  198/120     2 Minute Post BP  158/98        Oxygen Initial Assessment: Oxygen Initial Assessment - 11/16/19 1116      Home Oxygen   Home Oxygen Device  None    Sleep Oxygen Prescription  None    Home Exercise Oxygen Prescription  None    Home at Rest Exercise Oxygen Prescription  None      Initial 6 min Walk   Oxygen Used  None      Program Oxygen Prescription   Program Oxygen Prescription  None       Oxygen Re-Evaluation: Oxygen Re-Evaluation    Row Name 11/24/19 1537             Program Oxygen Prescription   Program Oxygen Prescription  None         Home Oxygen   Home Oxygen Device  None       Sleep Oxygen Prescription  None       Home Exercise Oxygen Prescription  None       Home at Rest Exercise Oxygen Prescription  None       Compliance with Home Oxygen Use  Yes         Goals/Expected Outcomes   Short Term Goals  To learn and exhibit compliance with exercise, home and travel O2 prescription;To learn and understand importance of monitoring SPO2 with pulse oximeter and demonstrate accurate use of the pulse oximeter.;To learn and understand importance of maintaining oxygen saturations>88%;To learn and demonstrate proper pursed lip breathing techniques or other breathing techniques.       Long  Term Goals  Exhibits compliance with exercise, home and travel O2 prescription;Verbalizes importance of monitoring SPO2 with pulse oximeter and return demonstration;Maintenance of O2 saturations>88%;Exhibits proper breathing techniques, such as pursed lip breathing  or other method taught during program  session;Compliance with respiratory medication       Comments  Patient is able to verbalize the importance of monitoring her SPO2 and maintaining her O2 saturation >88%. She is also demonstrate proper pursed lip breathing technique in class and is compliant with her exercise prescription and medications.       Goals/Expected Outcomes  Patient is meeting her expected goals and outcomes. Will continue to monitor.          Oxygen Discharge (Final Oxygen Re-Evaluation): Oxygen Re-Evaluation - 11/24/19 1537      Program Oxygen Prescription   Program Oxygen Prescription  None      Home Oxygen   Home Oxygen Device  None    Sleep Oxygen Prescription  None    Home Exercise Oxygen Prescription  None    Home at Rest Exercise Oxygen Prescription  None    Compliance with Home Oxygen Use  Yes      Goals/Expected Outcomes   Short Term Goals  To learn and exhibit compliance with exercise, home and travel O2 prescription;To learn and understand importance of monitoring SPO2 with pulse oximeter and demonstrate accurate use of the pulse oximeter.;To learn and understand importance of maintaining oxygen saturations>88%;To learn and demonstrate proper pursed lip breathing techniques or other breathing techniques.    Long  Term Goals  Exhibits compliance with exercise, home and travel O2 prescription;Verbalizes importance of monitoring SPO2 with pulse oximeter and return demonstration;Maintenance of O2 saturations>88%;Exhibits proper breathing techniques, such as pursed lip breathing or other method taught during program session;Compliance with respiratory medication    Comments  Patient is able to verbalize the importance of monitoring her SPO2 and maintaining her O2 saturation >88%. She is also demonstrate proper pursed lip breathing technique in class and is compliant with her exercise prescription and medications.    Goals/Expected Outcomes  Patient is meeting her expected goals and outcomes. Will continue to  monitor.       Initial Exercise Prescription: Initial Exercise Prescription - 11/16/19 1100      Date of Initial Exercise RX and Referring Provider   Date  11/16/19    Referring Provider  Dr. Everardo All    Expected Discharge Date  02/13/20      Arm Ergometer   Level  1    Watts  7    RPM  30    Minutes  17    METs  1.5      T5 Nustep   Level  1    SPM  49    Minutes  22    METs  1.8      Prescription Details   Frequency (times per week)  2    Duration  Progress to 30 minutes of continuous aerobic without signs/symptoms of physical distress      Intensity   THRR 40-80% of Max Heartrate  97-133-150    Ratings of Perceived Exertion  11-13    Perceived Dyspnea  0-4      Progression   Progression  Continue to progress workloads to maintain intensity without signs/symptoms of physical distress.      Resistance Training   Training Prescription  Yes    Weight  1    Reps  10-15       Perform Capillary Blood Glucose checks as needed.  Exercise Prescription Changes:   Exercise Comments:  Exercise Comments    Row Name 11/16/19 1109 11/24/19 1503  Exercise Comments  Today was patients inital assessment/orientation. She did well considering her residual effects of SOB and Wheezing post COVID-19. She is eager and wants to attend so that she can improve and decrease her SOB.  Will continue to progress as tolerated on exercise machines and weights.         Exercise Goals and Review:  Exercise Goals    Row Name 11/16/19 1108             Exercise Goals   Increase Physical Activity  Yes       Intervention  Provide advice, education, support and counseling about physical activity/exercise needs.;Develop an individualized exercise prescription for aerobic and resistive training based on initial evaluation findings, risk stratification, comorbidities and participant's personal goals.       Expected Outcomes  Short Term: Attend rehab on a regular basis to increase  amount of physical activity.;Long Term: Add in home exercise to make exercise part of routine and to increase amount of physical activity.       Increase Strength and Stamina  Yes       Intervention  Provide advice, education, support and counseling about physical activity/exercise needs.;Develop an individualized exercise prescription for aerobic and resistive training based on initial evaluation findings, risk stratification, comorbidities and participant's personal goals.       Expected Outcomes  Short Term: Perform resistance training exercises routinely during rehab and add in resistance training at home;Long Term: Improve cardiorespiratory fitness, muscular endurance and strength as measured by increased METs and functional capacity (6MWT)       Able to understand and use rate of perceived exertion (RPE) scale  Yes       Intervention  Provide education and explanation on how to use RPE scale       Expected Outcomes  Short Term: Able to use RPE daily in rehab to express subjective intensity level;Long Term:  Able to use RPE to guide intensity level when exercising independently       Able to understand and use Dyspnea scale  Yes       Intervention  Provide education and explanation on how to use Dyspnea scale       Expected Outcomes  Short Term: Able to use Dyspnea scale daily in rehab to express subjective sense of shortness of breath during exertion;Long Term: Able to use Dyspnea scale to guide intensity level when exercising independently       Knowledge and understanding of Target Heart Rate Range (THRR)  Yes       Intervention  Provide education and explanation of THRR including how the numbers were predicted and where they are located for reference       Expected Outcomes  Short Term: Able to use daily as guideline for intensity in rehab;Long Term: Able to use THRR to govern intensity when exercising independently       Able to check pulse independently  Yes       Intervention  Provide  education and demonstration on how to check pulse in carotid and radial arteries.;Review the importance of being able to check your own pulse for safety during independent exercise       Expected Outcomes  Short Term: Able to explain why pulse checking is important during independent exercise;Long Term: Able to check pulse independently and accurately       Understanding of Exercise Prescription  Yes       Intervention  Provide education, explanation, and written materials on patient's individual  exercise prescription       Expected Outcomes  Short Term: Able to explain program exercise prescription;Long Term: Able to explain home exercise prescription to exercise independently          Exercise Goals Re-Evaluation : Exercise Goals Re-Evaluation    Row Name 11/24/19 1501             Exercise Goal Re-Evaluation   Exercise Goals Review  Increase Physical Activity;Increase Strength and Stamina       Comments  Patient has just started the program. This is her 3rd visit. We will continue to monitor her progress.       Expected Outcomes  Reach her expected goals of breathing better and getting back to normal ADL's. she also wants to get back to work.          Discharge Exercise Prescription (Final Exercise Prescription Changes):   Nutrition:  Target Goals: Understanding of nutrition guidelines, daily intake of sodium 1500mg , cholesterol 200mg , calories 30% from fat and 7% or less from saturated fats, daily to have 5 or more servings of fruits and vegetables.  Biometrics: Pre Biometrics - 11/16/19 1111      Pre Biometrics   Height  5\' 2"  (1.575 m)    Weight  101.8 kg    Waist Circumference  44 inches    Hip Circumference  45.5 inches    Waist to Hip Ratio  0.97 %    BMI (Calculated)  41.05    Triceps Skinfold  26 mm    % Body Fat  48.4 %    Grip Strength  20.8 kg    Flexibility  7.6 in    Single Leg Stand  25 seconds        Nutrition Therapy Plan and Nutrition  Goals: Nutrition Therapy & Goals - 11/24/19 1543      Personal Nutrition Goals   Comments  We continue to work with patient and RD to schedule RD classes. Her medificts diet assessment score was 33. She continues to say she is trying to eat healthy. Will continue to monitor for progress.      Intervention Plan   Intervention  Nutrition handout(s) given to patient.       Nutrition Assessments: Nutrition Assessments - 11/16/19 1124      MEDFICTS Scores   Pre Score  33       Nutrition Goals Re-Evaluation:   Nutrition Goals Discharge (Final Nutrition Goals Re-Evaluation):   Psychosocial: Target Goals: Acknowledge presence or absence of significant depression and/or stress, maximize coping skills, provide positive support system. Participant is able to verbalize types and ability to use techniques and skills needed for reducing stress and depression.  Initial Review & Psychosocial Screening: Initial Psych Review & Screening - 11/16/19 1120      Initial Review   Current issues with  Current Anxiety/Panic   Due to not being able to breath post COVID-19     Family Dynamics   Good Support System?  Yes      Barriers   Psychosocial barriers to participate in program  There are no identifiable barriers or psychosocial needs.      Screening Interventions   Interventions  Encouraged to exercise       Quality of Life Scores: Quality of Life - 11/16/19 1121      Quality of Life   Select  Quality of Life      Quality of Life Scores   Health/Function Pre  20.88 %  Socioeconomic Pre  20.25 %    Psych/Spiritual Pre  20.86 %    Family Pre  20.8 %    GLOBAL Pre  20.72 %      Scores of 19 and below usually indicate a poorer quality of life in these areas.  A difference of  2-3 points is a clinically meaningful difference.  A difference of 2-3 points in the total score of the Quality of Life Index has been associated with significant improvement in overall quality of life,  self-image, physical symptoms, and general health in studies assessing change in quality of life.   PHQ-9: Recent Review Flowsheet Data    Depression screen [facility] 2/9 11/16/2019   Decreased Interest 0   Down, Depressed, Hopeless 0   PHQ - 2 Score 0   Altered sleeping 1   Tired, decreased energy 2   Change in appetite 1   Feeling bad or failure about yourself  0   Trouble concentrating 0   Moving slowly or fidgety/restless 0   Suicidal thoughts 0   PHQ-9 Score 4   Difficult doing work/chores Somewhat difficult     Interpretation of Total Score  Total Score Depression Severity:  1-4 = Minimal depression, 5-9 = Mild depression, 10-14 = Moderate depression, 15-19 = Moderately severe depression, 20-27 = Severe depression   Psychosocial Evaluation and Intervention: Psychosocial Evaluation - 11/16/19 1121      Psychosocial Evaluation & Interventions   Interventions  Encouraged to exercise with the program and follow exercise prescription    Continue Psychosocial Services   No Follow up required       Psychosocial Re-Evaluation: Psychosocial Re-Evaluation    Row Name 11/24/19 1541             Psychosocial Re-Evaluation   Current issues with  Current Anxiety/Panic       Comments  Patient's initial QOL score was 20.72 and her PHQ-9 score was 4. She does report anxiety related to her SOB. She is currently not being treated for her anxiety. Will continue to monitor.       Expected Outcomes  Patient will have no additional psychosocial issues identified at discharge.       Interventions  Relaxation education;Encouraged to attend Pulmonary Rehabilitation for the exercise;Stress management education       Continue Psychosocial Services   Follow up required by staff          Psychosocial Discharge (Final Psychosocial Re-Evaluation): Psychosocial Re-Evaluation - 11/24/19 1541      Psychosocial Re-Evaluation   Current issues with  Current Anxiety/Panic    Comments  Patient's initial  QOL score was 20.72 and her PHQ-9 score was 4. She does report anxiety related to her SOB. She is currently not being treated for her anxiety. Will continue to monitor.    Expected Outcomes  Patient will have no additional psychosocial issues identified at discharge.    Interventions  Relaxation education;Encouraged to attend Pulmonary Rehabilitation for the exercise;Stress management education    Continue Psychosocial Services   Follow up required by staff        Education: Education Goals: Education classes will be provided on a weekly basis, covering required topics. Participant will state understanding/return demonstration of topics presented.  Learning Barriers/Preferences: Learning Barriers/Preferences - 11/16/19 1004      Learning Barriers/Preferences   Learning Barriers  None    Learning Preferences  Pictoral;Group Instruction;Video;Individual Instruction;Skilled Demonstration       Education Topics: How Lungs Work and Diseases: -  Discuss the anatomy of the lungs and diseases that can affect the lungs, such as COPD.   Exercise: -Discuss the importance of exercise, FITT principles of exercise, normal and abnormal responses to exercise, and how to exercise safely.   Environmental Irritants: -Discuss types of environmental irritants and how to limit exposure to environmental irritants.   Meds/Inhalers and oxygen: - Discuss respiratory medications, definition of an inhaler and oxygen, and the proper way to use an inhaler and oxygen.   Energy Saving Techniques: - Discuss methods to conserve energy and decrease shortness of breath when performing activities of daily living.    Bronchial Hygiene / Breathing Techniques: - Discuss breathing mechanics, pursed-lip breathing technique,  proper posture, effective ways to clear airways, and other functional breathing techniques   Cleaning Equipment: - Provides group verbal and written instruction about the health risks of  elevated stress, cause of high stress, and healthy ways to reduce stress.   Nutrition I: Fats: - Discuss the types of cholesterol, what cholesterol does to the body, and how cholesterol levels can be controlled.   Nutrition II: Labels: -Discuss the different components of food labels and how to read food labels.   Respiratory Infections: - Discuss the signs and symptoms of respiratory infections, ways to prevent respiratory infections, and the importance of seeking medical treatment when having a respiratory infection.   PULMONARY REHAB OTHER RESPIRATORY from 11/24/2019 in [facility]  Date  11/24/19  Educator  Laural Benes. Khyan Oats  Instruction Review Code  2- Demonstrated Understanding      Stress I: Signs and Symptoms: - Discuss the causes of stress, how stress may lead to anxiety and depression, and ways to limit stress.   Stress II: Relaxation: -Discuss relaxation techniques to limit stress.   Oxygen for Home/Travel: - Discuss how to prepare for travel when on oxygen and proper ways to transport and store oxygen to ensure safety.   Knowledge Questionnaire Score: Knowledge Questionnaire Score - 11/16/19 1005      Knowledge Questionnaire Score   Pre Score  15/18       Core Components/Risk Factors/Patient Goals at Admission: Personal Goals and Risk Factors at Admission - 11/16/19 1125      Core Components/Risk Factors/Patient Goals on Admission    Weight Management  Weight Maintenance    Personal Goal Other  Yes    Personal Goal  Breath better, Be able to get back to normal ADL's, Get back to her daily life and to be able to return to work.    Intervention  Attend program 2 x week and to supplement with 3 x week home exercise plan.    Expected Outcomes  Reach expected goals.       Core Components/Risk Factors/Patient Goals Review:  Goals and Risk Factor Review    Row Name 11/24/19 1539             Core Components/Risk Factors/Patient Goals Review    Personal Goals Review  Weight Management/Obesity Get back to ADL's; and get back to her daily life. Breathe better.       Review  Patient is new to the program. She has completed 3 sessions. She does say however that she is doing more at home since she has been attending the program because she is not as afraid to do more. Will continue to monitor for progress.       Expected Outcomes  Patient will continue to attend sessions and complete the program meeting her personal goals.  Core Components/Risk Factors/Patient Goals at Discharge (Final Review):  Goals and Risk Factor Review - 11/24/19 1539      Core Components/Risk Factors/Patient Goals Review   Personal Goals Review  Weight Management/Obesity   Get back to ADL's; and get back to her daily life. Breathe better.   Review  Patient is new to the program. She has completed 3 sessions. She does say however that she is doing more at home since she has been attending the program because she is not as afraid to do more. Will continue to monitor for progress.    Expected Outcomes  Patient will continue to attend sessions and complete the program meeting her personal goals.       ITP Comments: ITP Comments    Row Name 11/16/19 1027           ITP Comments  Patient is coming to use post COVID-19 with pneumonia which has left her extremely SOB on exertion. Patient is eager to get started.          Comments: ITP REVIEW Pt is making expected progress toward pulmonary rehab goals after completing 3 sessions. Recommend continued exercise, life style modification, education, and utilization of breathing techniques to increase stamina and strength and decrease shortness of breath with exertion.

## 2019-11-29 ENCOUNTER — Encounter (HOSPITAL_COMMUNITY)
Admission: RE | Admit: 2019-11-29 | Discharge: 2019-11-29 | Disposition: A | Discharge status: Home / Self Care | Payer: No Typology Code available for payment source | Source: Ambulatory Visit | Attending: Pulmonary Disease | Admitting: Pulmonary Disease

## 2019-11-29 ENCOUNTER — Other Ambulatory Visit: Payer: Self-pay

## 2019-11-29 DIAGNOSIS — R0602 Shortness of breath: Secondary | ICD-10-CM | POA: Diagnosis not present

## 2019-11-29 NOTE — Progress Notes (Signed)
Daily Session Note  Patient Details  Name: MARKAN CAZAREZ MRN: [id number] Date of Birth: Apr 07, 1966 Referring Provider:     PULMONARY REHAB OTHER RESP ORIENTATION from 11/16/2019 in [place]  Referring Provider  Dr. Loanne Drilling      Encounter Date: 11/29/2019  Check In: Session Check In - 11/29/19 1330      Check-In   Supervising physician immediately available to respond to emergencies  See telemetry face sheet for immediately available MD    Location  [facility]    Staff Present  [name], MS, EP, [facility], Exercise Physiologist;Other    Virtual Visit  No    Medication changes reported      No    Fall or balance concerns reported     No    Tobacco Cessation  No Change    Warm-up and Cool-down  Performed as group-led instruction    Resistance Training Performed  Yes    VAD Patient?  No    PAD/SET Patient?  No      Pain Assessment   Currently in Pain?  No/denies    Pain Score  0-No pain    Multiple Pain Sites  No       Capillary Blood Glucose: No results found for this or any previous visit (from the past 24 hour(s)).    Social History   Tobacco Use  Smoking Status Never Smoker  Smokeless Tobacco Never Used    Goals Met:  Proper associated with RPD/PD & O2 Sat Independence with exercise equipment Improved SOB with ADL's Using PLB without cueing & demonstrates good technique Exercise tolerated well Personal goals reviewed No report of cardiac concerns or symptoms Strength training completed today  Goals Unmet:  Not Applicable  Comments: check out 14:30   Dr. Kate Sable is Medical Director for [place] and Pulmonary Rehab.

## 2019-12-01 ENCOUNTER — Telehealth: Payer: Self-pay | Admitting: Pulmonary Disease

## 2019-12-01 ENCOUNTER — Encounter (HOSPITAL_COMMUNITY)
Admission: RE | Admit: 2019-12-01 | Discharge: 2019-12-01 | Disposition: A | Discharge status: Home / Self Care | Payer: No Typology Code available for payment source | Source: Ambulatory Visit | Attending: Pulmonary Disease | Admitting: Pulmonary Disease

## 2019-12-01 ENCOUNTER — Other Ambulatory Visit: Payer: Self-pay

## 2019-12-01 DIAGNOSIS — R0602 Shortness of breath: Secondary | ICD-10-CM | POA: Diagnosis not present

## 2019-12-01 DIAGNOSIS — J410 Simple chronic bronchitis: Secondary | ICD-10-CM

## 2019-12-01 NOTE — Progress Notes (Signed)
Daily Session Note  Patient Details  Name: Jane Bennett MRN: [id number] Date of Birth: Nov 12, 1965 Referring Provider:     PULMONARY REHAB OTHER RESP ORIENTATION from 11/16/2019 in [place]  Referring Provider  Dr. Loanne Drilling      Encounter Date: 12/01/2019  Check In: Session Check In - 12/01/19 1402      Check-In   Supervising physician immediately available to respond to emergencies  See telemetry face sheet for immediately available MD    Location  [facility]    Staff Present  [name], MS, EP, [facility], Exercise Physiologist;Debra [name], RN, BSN    Virtual Visit  No    Medication changes reported      No    Fall or balance concerns reported     No    Tobacco Cessation  No Change    Warm-up and Cool-down  Performed as group-led instruction    Resistance Training Performed  Yes    VAD Patient?  No    PAD/SET Patient?  No      Pain Assessment   Currently in Pain?  No/denies    Pain Score  0-No pain    Multiple Pain Sites  No       Capillary Blood Glucose: No results found for this or any previous visit (from the past 24 hour(s)).    Social History   Tobacco Use  Smoking Status Never Smoker  Smokeless Tobacco Never Used    Goals Met:  Proper associated with RPD/PD & O2 Sat Independence with exercise equipment Using PLB without cueing & demonstrates good technique Exercise tolerated well Personal goals reviewed No report of cardiac concerns or symptoms Strength training completed today  Goals Unmet:  Not Applicable  Comments: Check out: 1430    Dr. Kate Sable is Medical Director for [place] and Pulmonary Rehab.

## 2019-12-02 NOTE — Telephone Encounter (Signed)
Will route to Lauren.  

## 2019-12-02 NOTE — Telephone Encounter (Signed)
Dr. Everardo All out of office until 3/29, will let her know they are in office for signature

## 2019-12-06 ENCOUNTER — Encounter (HOSPITAL_COMMUNITY)
Admission: RE | Admit: 2019-12-06 | Discharge: 2019-12-06 | Disposition: A | Discharge status: Home / Self Care | Payer: No Typology Code available for payment source | Source: Ambulatory Visit | Attending: Pulmonary Disease | Admitting: Pulmonary Disease

## 2019-12-06 ENCOUNTER — Other Ambulatory Visit: Payer: Self-pay

## 2019-12-06 DIAGNOSIS — R0602 Shortness of breath: Secondary | ICD-10-CM | POA: Diagnosis not present

## 2019-12-06 NOTE — Telephone Encounter (Signed)
Forms signed and sent back to [facility] 

## 2019-12-06 NOTE — Progress Notes (Signed)
Daily Session Note  Patient Details  Name: Jane Bennett MRN: [id number] Date of Birth: 1966/06/23 Referring Provider:     PULMONARY REHAB OTHER RESP ORIENTATION from 11/16/2019 in [place]  Referring Provider  Dr. Loanne Drilling      Encounter Date: 12/06/2019  Check In: Session Check In - 12/06/19 1329      Check-In   Supervising physician immediately available to respond to emergencies  See telemetry face sheet for immediately available MD    Location  [facility]    Staff Present  [name], MS, EP, [facility], Exercise Physiologist;Other    Virtual Visit  No    Medication changes reported      No    Fall or balance concerns reported     No    Warm-up and Cool-down  Performed as group-led instruction    Resistance Training Performed  Yes    VAD Patient?  No    PAD/SET Patient?  No      Pain Assessment   Currently in Pain?  No/denies    Pain Score  0-No pain    Multiple Pain Sites  No       Capillary Blood Glucose: No results found for this or any previous visit (from the past 24 hour(s)).    Social History   Tobacco Use  Smoking Status Never Smoker  Smokeless Tobacco Never Used    Goals Met:  Proper associated with RPD/PD & O2 Sat Independence with exercise equipment Improved SOB with ADL's Using PLB without cueing & demonstrates good technique Exercise tolerated well Personal goals reviewed No report of cardiac concerns or symptoms Strength training completed today  Goals Unmet:  Not Applicable  Comments: check out 14:30   Dr. Kate Sable is Medical Director for [place] and Pulmonary Rehab.

## 2019-12-08 ENCOUNTER — Other Ambulatory Visit: Payer: Self-pay

## 2019-12-08 ENCOUNTER — Encounter (HOSPITAL_COMMUNITY)
Admission: RE | Admit: 2019-12-08 | Discharge: 2019-12-08 | Disposition: A | Discharge status: Home / Self Care | Payer: No Typology Code available for payment source | Source: Ambulatory Visit | Attending: Pulmonary Disease | Admitting: Pulmonary Disease

## 2019-12-08 DIAGNOSIS — R0602 Shortness of breath: Secondary | ICD-10-CM | POA: Diagnosis not present

## 2019-12-08 DIAGNOSIS — J410 Simple chronic bronchitis: Secondary | ICD-10-CM

## 2019-12-08 NOTE — Progress Notes (Signed)
Daily Session Note  Patient Details  Name: Jane Bennett MRN: [id number] Date of Birth: 11-10-1965 Referring Provider:     PULMONARY REHAB OTHER RESP ORIENTATION from 11/16/2019 in [place]  Referring Provider  Dr. Loanne Drilling      Encounter Date: 12/08/2019  Check In: Session Check In - 12/08/19 1330      Check-In   Supervising physician immediately available to respond to emergencies  See telemetry face sheet for immediately available MD    Location  [facility]    Staff Present  [name], MS, EP, [facility], Exercise Physiologist;Fern Asmar [name], RN, BSN    Virtual Visit  No    Medication changes reported      No    Fall or balance concerns reported     No    Tobacco Cessation  No Change    Warm-up and Cool-down  Performed as group-led instruction    Resistance Training Performed  Yes    VAD Patient?  No    PAD/SET Patient?  No      Pain Assessment   Currently in Pain?  No/denies    Pain Score  0-No pain    Multiple Pain Sites  No       Capillary Blood Glucose: No results found for this or any previous visit (from the past 24 hour(s)).    Social History   Tobacco Use  Smoking Status Never Smoker  Smokeless Tobacco Never Used    Goals Met:  Proper associated with RPD/PD & O2 Sat Independence with exercise equipment Improved SOB with ADL's Using PLB without cueing & demonstrates good technique Exercise tolerated well No report of cardiac concerns or symptoms Strength training completed today  Goals Unmet:  Not Applicable  Comments: Check out 1430.   Dr. Kate Sable is Medical Director for [facility] and Pulmonary Rehab.

## 2019-12-13 ENCOUNTER — Other Ambulatory Visit: Payer: Self-pay

## 2019-12-13 ENCOUNTER — Encounter (HOSPITAL_COMMUNITY)
Admission: RE | Admit: 2019-12-13 | Discharge: 2019-12-13 | Disposition: A | Discharge status: Home / Self Care | Payer: No Typology Code available for payment source | Source: Ambulatory Visit | Attending: Pulmonary Disease | Admitting: Pulmonary Disease

## 2019-12-13 DIAGNOSIS — R0602 Shortness of breath: Secondary | ICD-10-CM | POA: Diagnosis not present

## 2019-12-13 NOTE — Progress Notes (Signed)
Daily Session Note  Patient Details  Name: Jane Bennett MRN: [id number] Date of Birth: [birth year] Referring Provider:     PULMONARY REHAB OTHER RESP ORIENTATION from 11/16/2019 in [place]  Referring Provider  Dr. Loanne Drilling      Encounter Date: 12/13/2019  Check In: Session Check In - 12/13/19 1045      Check-In   Supervising physician immediately available to respond to emergencies  See telemetry face sheet for immediately available MD    Location  [facility]    Staff Present  [name], MS, EP, [facility], Exercise Physiologist;Other    Virtual Visit  No    Medication changes reported      No    Fall or balance concerns reported     No    Warm-up and Cool-down  Performed as group-led instruction    Resistance Training Performed  Yes    VAD Patient?  No    PAD/SET Patient?  No      Pain Assessment   Currently in Pain?  No/denies    Pain Score  0-No pain    Multiple Pain Sites  No       Capillary Blood Glucose: No results found for this or any previous visit (from the past 24 hour(s)).    Social History   Tobacco Use  Smoking Status Never Smoker  Smokeless Tobacco Never Used    Goals Met:  Proper associated with RPD/PD & O2 Sat Independence with exercise equipment Improved SOB with ADL's Using PLB without cueing & demonstrates good technique Exercise tolerated well Personal goals reviewed No report of cardiac concerns or symptoms Strength training completed today  Goals Unmet:  Not Applicable  Comments: check out 11:45   Dr. Kate Sable is Medical Director for [place] and Pulmonary Rehab.

## 2019-12-15 ENCOUNTER — Other Ambulatory Visit: Payer: Self-pay

## 2019-12-15 ENCOUNTER — Encounter (HOSPITAL_COMMUNITY)
Admission: RE | Admit: 2019-12-15 | Discharge: 2019-12-15 | Disposition: A | Discharge status: Home / Self Care | Payer: No Typology Code available for payment source | Source: Ambulatory Visit | Attending: Pulmonary Disease | Admitting: Pulmonary Disease

## 2019-12-15 DIAGNOSIS — R0602 Shortness of breath: Secondary | ICD-10-CM | POA: Diagnosis not present

## 2019-12-15 DIAGNOSIS — J410 Simple chronic bronchitis: Secondary | ICD-10-CM

## 2019-12-15 NOTE — Progress Notes (Addendum)
Pulmonary Individual Treatment Plan  Patient Details  Name: Jane Bennett MRN: [id number] Date of Birth: 1966/07/19 Referring Provider:     PULMONARY REHAB OTHER RESP ORIENTATION from 11/16/2019 in [facility] CARDIAC REHABILITATION  Referring Provider  Dr. Everardo All      Initial Encounter Date:    PULMONARY REHAB OTHER RESP ORIENTATION from 11/16/2019 in [facility] CARDIAC REHABILITATION  Date  11/16/19      Visit Diagnosis: SOB (shortness of breath)  Simple chronic bronchitis (HCC)  Patient's Home Medications on Admission:   Current Outpatient Medications:  .  albuterol (VENTOLIN HFA) 108 (90 Base) MCG/ACT inhaler, INHALE 1 TO 2 PUFFS BY MOUTH EVERY 4 TO 6 HOURS, Disp: , Rfl:  .  fluticasone-salmeterol (ADVAIR HFA) 230-21 MCG/ACT inhaler, Inhale 2 puffs into the lungs 2 (two) times daily., Disp: 1 Inhaler, Rfl: 12 .  ondansetron (ZOFRAN-ODT) 8 MG disintegrating tablet, Take 8 mg by mouth every 6 (six) hours as needed., Disp: , Rfl:  .  rivaroxaban (XARELTO) 20 MG TABS tablet, Take 20 mg by mouth daily with supper., Disp: , Rfl:   Past Medical History: Past Medical History:  Diagnosis Date  . DVT (deep venous thrombosis) (HCC)   . High cholesterol   . Pneumonia     Tobacco Use: Social History   Tobacco Use  Smoking Status Never Smoker  Smokeless Tobacco Never Used    Labs: Recent Review Flowsheet Data    There is no flowsheet data to display.      Capillary Blood Glucose: No results found for: GLUCAP   Pulmonary Assessment Scores: Pulmonary Assessment Scores    Row Name 11/16/19 1117         ADL UCSD   ADL Phase  Entry     SOB Score total  90     Rest  0     Walk  11     Stairs  5     Bath  4     Dress  5     Shop  5       CAT Score   CAT Score  33       mMRC Score   mMRC Score  4       UCSD: Self-administered rating of dyspnea associated with activities of daily living (ADLs) 6-point scale (0 = "not at all" to 5 = "maximal or unable to do  because of breathlessness")  Scoring Scores range from 0 to 120.  Minimally important difference is 5 units  CAT: CAT can identify the health impairment of COPD patients and is better correlated with disease progression.  CAT has a scoring range of zero to 40. The CAT score is classified into four groups of low (less than 10), medium (10 - 20), high (21-30) and very high (31-40) based on the impact level of disease on health status. A CAT score over 10 suggests significant symptoms.  A worsening CAT score could be explained by an exacerbation, poor medication adherence, poor inhaler technique, or progression of COPD or comorbid conditions.  CAT MCID is 2 points  mMRC: mMRC (Modified Medical Research Council) Dyspnea Scale is used to assess the degree of baseline functional disability in patients of respiratory disease due to dyspnea. No minimal important difference is established. A decrease in score of 1 point or greater is considered a positive change.   Pulmonary Function Assessment:   Exercise Target Goals: Exercise Program Goal: Individual exercise prescription set using results from initial 6 min walk  test and THRR while considering  patient's activity barriers and safety.   Exercise Prescription Goal: Initial exercise prescription builds to 30-45 minutes a day of aerobic activity, 2-3 days per week.  Home exercise guidelines will be given to patient during program as part of exercise prescription that the participant will acknowledge.  Activity Barriers & Risk Stratification: Activity Barriers & Cardiac Risk Stratification - 11/16/19 1103      Activity Barriers & Cardiac Risk Stratification   Activity Barriers  Shortness of Breath    Cardiac Risk Stratification  Moderate       6 Minute Walk: 6 Minute Walk    Row Name 11/16/19 1058         6 Minute Walk   Phase  Initial     Distance  700 feet     Walk Time  6 minutes     # of Rest Breaks  1     MPH  1.32     METS   2.01     RPE  14     Perceived Dyspnea   16     VO2 Peak  0.56     Symptoms  Yes (comment)     Comments  Extreme SOB. Had to stop to rest for 2 minutes then was able to finish the test.     Resting HR  83 bpm     Resting BP  170/98     Resting Oxygen Saturation   97 %     Exercise Oxygen Saturation  during 6 min walk  96 %     Max Ex. HR  107 bpm     Max Ex. BP  198/120     2 Minute Post BP  158/98        Oxygen Initial Assessment: Oxygen Initial Assessment - 11/16/19 1116      Home Oxygen   Home Oxygen Device  None    Sleep Oxygen Prescription  None    Home Exercise Oxygen Prescription  None    Home at Rest Exercise Oxygen Prescription  None      Initial 6 min Walk   Oxygen Used  None      Program Oxygen Prescription   Program Oxygen Prescription  None       Oxygen Re-Evaluation: Oxygen Re-Evaluation    Row Name 11/24/19 1537 12/15/19 1525           Program Oxygen Prescription   Program Oxygen Prescription  None  None        Home Oxygen   Home Oxygen Device  None  None      Sleep Oxygen Prescription  None  None      Home Exercise Oxygen Prescription  None  None      Home at Rest Exercise Oxygen Prescription  None  None      Compliance with Home Oxygen Use  Yes  Yes        Goals/Expected Outcomes   Short Term Goals  To learn and exhibit compliance with exercise, home and travel O2 prescription;To learn and understand importance of monitoring SPO2 with pulse oximeter and demonstrate accurate use of the pulse oximeter.;To learn and understand importance of maintaining oxygen saturations>88%;To learn and demonstrate proper pursed lip breathing techniques or other breathing techniques.  To learn and exhibit compliance with exercise, home and travel O2 prescription;To learn and understand importance of monitoring SPO2 with pulse oximeter and demonstrate accurate use of the pulse oximeter.;To learn and understand importance  of maintaining oxygen saturations>88%;To learn  and demonstrate proper pursed lip breathing techniques or other breathing techniques.      Long  Term Goals  Exhibits compliance with exercise, home and travel O2 prescription;Verbalizes importance of monitoring SPO2 with pulse oximeter and return demonstration;Maintenance of O2 saturations>88%;Exhibits proper breathing techniques, such as pursed lip breathing or other method taught during program session;Compliance with respiratory medication  Exhibits compliance with exercise, home and travel O2 prescription;Verbalizes importance of monitoring SPO2 with pulse oximeter and return demonstration;Maintenance of O2 saturations>88%;Exhibits proper breathing techniques, such as pursed lip breathing or other method taught during program session;Compliance with respiratory medication      Comments  Patient is able to verbalize the importance of monitoring her SPO2 and maintaining her O2 saturation >88%. She is also demonstrate proper pursed lip breathing technique in class and is compliant with her exercise prescription and medications.  Patient is able to verbalize the importance of monitoring her SPO2 and maintaining her O2 saturation >88% and to demonstrate proper usage of pulse oximeter. She is also able to demonstrate proper pursed lip breathing technique in class and is compliant with her exercise prescription and medications.      Goals/Expected Outcomes  Patient is meeting her expected goals and outcomes. Will continue to monitor.  Patient is meeting her expected goals and outcomes. Will continue to monitor.         Oxygen Discharge (Final Oxygen Re-Evaluation): Oxygen Re-Evaluation - 12/15/19 1525      Program Oxygen Prescription   Program Oxygen Prescription  None      Home Oxygen   Home Oxygen Device  None    Sleep Oxygen Prescription  None    Home Exercise Oxygen Prescription  None    Home at Rest Exercise Oxygen Prescription  None    Compliance with Home Oxygen Use  Yes       Goals/Expected Outcomes   Short Term Goals  To learn and exhibit compliance with exercise, home and travel O2 prescription;To learn and understand importance of monitoring SPO2 with pulse oximeter and demonstrate accurate use of the pulse oximeter.;To learn and understand importance of maintaining oxygen saturations>88%;To learn and demonstrate proper pursed lip breathing techniques or other breathing techniques.    Long  Term Goals  Exhibits compliance with exercise, home and travel O2 prescription;Verbalizes importance of monitoring SPO2 with pulse oximeter and return demonstration;Maintenance of O2 saturations>88%;Exhibits proper breathing techniques, such as pursed lip breathing or other method taught during program session;Compliance with respiratory medication    Comments  Patient is able to verbalize the importance of monitoring her SPO2 and maintaining her O2 saturation >88% and to demonstrate proper usage of pulse oximeter. She is also able to demonstrate proper pursed lip breathing technique in class and is compliant with her exercise prescription and medications.    Goals/Expected Outcomes  Patient is meeting her expected goals and outcomes. Will continue to monitor.       Initial Exercise Prescription: Initial Exercise Prescription - 11/16/19 1100      Date of Initial Exercise RX and Referring Provider   Date  11/16/19    Referring Provider  Dr. Everardo All    Expected Discharge Date  02/13/20      Arm Ergometer   Level  1    Watts  7    RPM  30    Minutes  17    METs  1.5      T5 Nustep   Level  1  SPM  49    Minutes  22    METs  1.8      Prescription Details   Frequency (times per week)  2    Duration  Progress to 30 minutes of continuous aerobic without signs/symptoms of physical distress      Intensity   THRR 40-80% of Max Heartrate  97-133-150    Ratings of Perceived Exertion  11-13    Perceived Dyspnea  0-4      Progression   Progression  Continue to progress  workloads to maintain intensity without signs/symptoms of physical distress.      Resistance Training   Training Prescription  Yes    Weight  1    Reps  10-15       Perform Capillary Blood Glucose checks as needed.  Exercise Prescription Changes:  Exercise Prescription Changes    Row Name 12/16/19 1400             Response to Exercise   Blood Pressure (Admit)  168/98       Blood Pressure (Exercise)  160/100       Blood Pressure (Exit)  150/92       Heart Rate (Admit)  104 bpm       Heart Rate (Exercise)  102 bpm       Heart Rate (Exit)  96 bpm       Oxygen Saturation (Admit)  96 %       Oxygen Saturation (Exercise)  96 %       Oxygen Saturation (Exit)  96 %       Rating of Perceived Exertion (Exercise)  12       Perceived Dyspnea (Exercise)  12       Symptoms  extreme SOB       Duration  Continue with 30 min of aerobic exercise without signs/symptoms of physical distress.       Intensity  THRR unchanged         Progression   Progression  Continue to progress workloads to maintain intensity without signs/symptoms of physical distress.       Average METs  1.8         Resistance Training   Training Prescription  Yes       Weight  2       Reps  10-15       Time  10 Minutes         Arm Ergometer   Level  1.2       Watts  13       RPM  51       Minutes  17       METs  1.8         T5 Nustep   Level  1       SPM  88       Minutes  22       METs  1.8         Home Exercise Plan   Plans to continue exercise at  Home (comment)       Frequency  Add 3 additional days to program exercise sessions.       Initial Home Exercises Provided  11/16/19          Exercise Comments:  Exercise Comments    Row Name 11/16/19 1109 11/24/19 1503 12/16/19 1437       Exercise Comments  Today was patients inital assessment/orientation. She did well considering her residual  effects of SOB and Wheezing post COVID-19. She is eager and wants to attend so that she can improve and  decrease her SOB.  Will continue to progress as tolerated on exercise machines and weights.  Patient has completed 9 sessions. She is still dealing with the residuals of having COVID-19. She has a very difficult time breathing. Inspite of this she still comes consistently and tries very hard on each machine. Will continue to progress her workloads and weights as tolerated.        Exercise Goals and Review:  Exercise Goals    Row Name 11/16/19 1108             Exercise Goals   Increase Physical Activity  Yes       Intervention  Provide advice, education, support and counseling about physical activity/exercise needs.;Develop an individualized exercise prescription for aerobic and resistive training based on initial evaluation findings, risk stratification, comorbidities and participant's personal goals.       Expected Outcomes  Short Term: Attend rehab on a regular basis to increase amount of physical activity.;Long Term: Add in home exercise to make exercise part of routine and to increase amount of physical activity.       Increase Strength and Stamina  Yes       Intervention  Provide advice, education, support and counseling about physical activity/exercise needs.;Develop an individualized exercise prescription for aerobic and resistive training based on initial evaluation findings, risk stratification, comorbidities and participant's personal goals.       Expected Outcomes  Short Term: Perform resistance training exercises routinely during rehab and add in resistance training at home;Long Term: Improve cardiorespiratory fitness, muscular endurance and strength as measured by increased METs and functional capacity (6MWT)       Able to understand and use rate of perceived exertion (RPE) scale  Yes       Intervention  Provide education and explanation on how to use RPE scale       Expected Outcomes  Short Term: Able to use RPE daily in rehab to express subjective intensity level;Long Term:  Able  to use RPE to guide intensity level when exercising independently       Able to understand and use Dyspnea scale  Yes       Intervention  Provide education and explanation on how to use Dyspnea scale       Expected Outcomes  Short Term: Able to use Dyspnea scale daily in rehab to express subjective sense of shortness of breath during exertion;Long Term: Able to use Dyspnea scale to guide intensity level when exercising independently       Knowledge and understanding of Target Heart Rate Range (THRR)  Yes       Intervention  Provide education and explanation of THRR including how the numbers were predicted and where they are located for reference       Expected Outcomes  Short Term: Able to use daily as guideline for intensity in rehab;Long Term: Able to use THRR to govern intensity when exercising independently       Able to check pulse independently  Yes       Intervention  Provide education and demonstration on how to check pulse in carotid and radial arteries.;Review the importance of being able to check your own pulse for safety during independent exercise       Expected Outcomes  Short Term: Able to explain why pulse checking is important during independent exercise;Long Term: Able to check pulse  independently and accurately       Understanding of Exercise Prescription  Yes       Intervention  Provide education, explanation, and written materials on patient's individual exercise prescription       Expected Outcomes  Short Term: Able to explain program exercise prescription;Long Term: Able to explain home exercise prescription to exercise independently          Exercise Goals Re-Evaluation : Exercise Goals Re-Evaluation    Row Name 11/24/19 1501 12/16/19 1436           Exercise Goal Re-Evaluation   Exercise Goals Review  Increase Physical Activity;Increase Strength and Stamina  Increase Physical Activity;Increase Strength and Stamina      Comments  Patient has just started the program.  This is her 3rd visit. We will continue to monitor her progress.  Patient goals are to breathe better and to get back to normal ADL's.      Expected Outcomes  Reach her expected goals of breathing better and getting back to normal ADL's. she also wants to get back to work.  Reach her personal goals         Discharge Exercise Prescription (Final Exercise Prescription Changes): Exercise Prescription Changes - 12/16/19 1400      Response to Exercise   Blood Pressure (Admit)  168/98    Blood Pressure (Exercise)  160/100    Blood Pressure (Exit)  150/92    Heart Rate (Admit)  104 bpm    Heart Rate (Exercise)  102 bpm    Heart Rate (Exit)  96 bpm    Oxygen Saturation (Admit)  96 %    Oxygen Saturation (Exercise)  96 %    Oxygen Saturation (Exit)  96 %    Rating of Perceived Exertion (Exercise)  12    Perceived Dyspnea (Exercise)  12    Symptoms  extreme SOB    Duration  Continue with 30 min of aerobic exercise without signs/symptoms of physical distress.    Intensity  THRR unchanged      Progression   Progression  Continue to progress workloads to maintain intensity without signs/symptoms of physical distress.    Average METs  1.8      Resistance Training   Training Prescription  Yes    Weight  2    Reps  10-15    Time  10 Minutes      Arm Ergometer   Level  1.2    Watts  13    RPM  51    Minutes  17    METs  1.8      T5 Nustep   Level  1    SPM  88    Minutes  22    METs  1.8      Home Exercise Plan   Plans to continue exercise at  Home (comment)    Frequency  Add 3 additional days to program exercise sessions.    Initial Home Exercises Provided  11/16/19       Nutrition:  Target Goals: Understanding of nutrition guidelines, daily intake of sodium 1500mg , cholesterol 200mg , calories 30% from fat and 7% or less from saturated fats, daily to have 5 or more servings of fruits and vegetables.  Biometrics: Pre Biometrics - 11/16/19 1111      Pre Biometrics    Height  5\' 2"  (1.575 m)    Weight  101.8 kg    Waist Circumference  44 inches    Hip Circumference  45.5 inches    Waist to Hip Ratio  0.97 %    BMI (Calculated)  41.05    Triceps Skinfold  26 mm    % Body Fat  48.4 %    Grip Strength  20.8 kg    Flexibility  7.6 in    Single Leg Stand  25 seconds        Nutrition Therapy Plan and Nutrition Goals: Nutrition Therapy & Goals - 12/15/19 1525      Personal Nutrition Goals   Comments  We continue to work with patient and RD to schedule RD classes. Her medificts diet assessment score was 33. She continues to say she is trying to eat healthy. Will continue to monitor for progress.      Intervention Plan   Intervention  Nutrition handout(s) given to patient.       Nutrition Assessments: Nutrition Assessments - 11/16/19 1124      MEDFICTS Scores   Pre Score  33       Nutrition Goals Re-Evaluation:   Nutrition Goals Discharge (Final Nutrition Goals Re-Evaluation):   Psychosocial: Target Goals: Acknowledge presence or absence of significant depression and/or stress, maximize coping skills, provide positive support system. Participant is able to verbalize types and ability to use techniques and skills needed for reducing stress and depression.  Initial Review & Psychosocial Screening: Initial Psych Review & Screening - 11/16/19 1120      Initial Review   Current issues with  Current Anxiety/Panic   Due to not being able to breath post COVID-19     Family Dynamics   Good Support System?  Yes      Barriers   Psychosocial barriers to participate in program  There are no identifiable barriers or psychosocial needs.      Screening Interventions   Interventions  Encouraged to exercise       Quality of Life Scores: Quality of Life - 11/16/19 1121      Quality of Life   Select  Quality of Life      Quality of Life Scores   Health/Function Pre  20.88 %    Socioeconomic Pre  20.25 %    Psych/Spiritual Pre  20.86 %     Family Pre  20.8 %    GLOBAL Pre  20.72 %      Scores of 19 and below usually indicate a poorer quality of life in these areas.  A difference of  2-3 points is a clinically meaningful difference.  A difference of 2-3 points in the total score of the Quality of Life Index has been associated with significant improvement in overall quality of life, self-image, physical symptoms, and general health in studies assessing change in quality of life.   PHQ-9: Recent Review Flowsheet Data    Depression screen [facility] 2/9 11/16/2019   Decreased Interest 0   Down, Depressed, Hopeless 0   PHQ - 2 Score 0   Altered sleeping 1   Tired, decreased energy 2   Change in appetite 1   Feeling bad or failure about yourself  0   Trouble concentrating 0   Moving slowly or fidgety/restless 0   Suicidal thoughts 0   PHQ-9 Score 4   Difficult doing work/chores Somewhat difficult     Interpretation of Total Score  Total Score Depression Severity:  1-4 = Minimal depression, 5-9 = Mild depression, 10-14 = Moderate depression, 15-19 = Moderately severe depression, 20-27 = Severe depression   Psychosocial Evaluation and Intervention:  Psychosocial Evaluation - 11/16/19 1121      Psychosocial Evaluation & Interventions   Interventions  Encouraged to exercise with the program and follow exercise prescription    Continue Psychosocial Services   No Follow up required       Psychosocial Re-Evaluation: Psychosocial Re-Evaluation    Row Name 11/24/19 1541 12/15/19 1529           Psychosocial Re-Evaluation   Current issues with  Current Anxiety/Panic  Current Anxiety/Panic      Comments  Patient's initial QOL score was 20.72 and her PHQ-9 score was 4. She does report anxiety related to her SOB. She is currently not being treated for her anxiety. Will continue to monitor.  Patient's initial QOL score was 20.72 and her PHQ-9 score was 4. She does report anxiety related to her SOB. She is currently not being treated  for her anxiety. Will continue to monitor.      Expected Outcomes  Patient will have no additional psychosocial issues identified at discharge.  Patient will have no additional psychosocial issues identified at discharge.      Interventions  Relaxation education;Encouraged to attend Pulmonary Rehabilitation for the exercise;Stress management education  Relaxation education;Encouraged to attend Pulmonary Rehabilitation for the exercise;Stress management education      Continue Psychosocial Services   Follow up required by staff  Follow up required by staff         Psychosocial Discharge (Final Psychosocial Re-Evaluation): Psychosocial Re-Evaluation - 12/15/19 1529      Psychosocial Re-Evaluation   Current issues with  Current Anxiety/Panic    Comments  Patient's initial QOL score was 20.72 and her PHQ-9 score was 4. She does report anxiety related to her SOB. She is currently not being treated for her anxiety. Will continue to monitor.    Expected Outcomes  Patient will have no additional psychosocial issues identified at discharge.    Interventions  Relaxation education;Encouraged to attend Pulmonary Rehabilitation for the exercise;Stress management education    Continue Psychosocial Services   Follow up required by staff        Education: Education Goals: Education classes will be provided on a weekly basis, covering required topics. Participant will state understanding/return demonstration of topics presented.  Learning Barriers/Preferences: Learning Barriers/Preferences - 11/16/19 1004      Learning Barriers/Preferences   Learning Barriers  None    Learning Preferences  Pictoral;Group Instruction;Video;Individual Instruction;Skilled Demonstration       Education Topics: How Lungs Work and Diseases: - Discuss the anatomy of the lungs and diseases that can affect the lungs, such as COPD.   Exercise: -Discuss the importance of exercise, FITT principles of exercise, normal and  abnormal responses to exercise, and how to exercise safely.   Environmental Irritants: -Discuss types of environmental irritants and how to limit exposure to environmental irritants.   Meds/Inhalers and oxygen: - Discuss respiratory medications, definition of an inhaler and oxygen, and the proper way to use an inhaler and oxygen.   Energy Saving Techniques: - Discuss methods to conserve energy and decrease shortness of breath when performing activities of daily living.    Bronchial Hygiene / Breathing Techniques: - Discuss breathing mechanics, pursed-lip breathing technique,  proper posture, effective ways to clear airways, and other functional breathing techniques   Cleaning Equipment: - Provides group verbal and written instruction about the health risks of elevated stress, cause of high stress, and healthy ways to reduce stress.   Nutrition I: Fats: - Discuss the types of cholesterol, what  cholesterol does to the body, and how cholesterol levels can be controlled.   Nutrition II: Labels: -Discuss the different components of food labels and how to read food labels.   Respiratory Infections: - Discuss the signs and symptoms of respiratory infections, ways to prevent respiratory infections, and the importance of seeking medical treatment when having a respiratory infection.   PULMONARY REHAB OTHER RESPIRATORY from 12/15/2019 in [place] [facility]  Date  11/24/19  Educator  Timoteo Expose  Instruction Review Code  2- Demonstrated Understanding      Stress I: Signs and Symptoms: - Discuss the causes of stress, how stress may lead to anxiety and depression, and ways to limit stress.   PULMONARY REHAB OTHER RESPIRATORY from 12/15/2019 in [place] [facility]  Date  12/01/19  Educator  DC  Instruction Review Code  2- Demonstrated Understanding      Stress II: Relaxation: -Discuss relaxation techniques to limit stress.   PULMONARY REHAB OTHER  RESPIRATORY from 12/15/2019 in [place] [facility]  Date  12/08/19  Educator  Timoteo Expose  Instruction Review Code  2- Demonstrated Understanding      Oxygen for Home/Travel: - Discuss how to prepare for travel when on oxygen and proper ways to transport and store oxygen to ensure safety.   PULMONARY REHAB OTHER RESPIRATORY from 12/15/2019 in [place] [facility]  Date  12/15/19  Educator  Timoteo Expose  Instruction Review Code  2- Demonstrated Understanding      Knowledge Questionnaire Score: Knowledge Questionnaire Score - 11/16/19 1005      Knowledge Questionnaire Score   Pre Score  15/18       Core Components/Risk Factors/Patient Goals at Admission: Personal Goals and Risk Factors at Admission - 11/16/19 1125      Core Components/Risk Factors/Patient Goals on Admission    Weight Management  Weight Maintenance    Personal Goal Other  Yes    Personal Goal  Breath better, Be able to get back to normal ADL's, Get back to her daily life and to be able to return to work.    Intervention  Attend program 2 x week and to supplement with 3 x week home exercise plan.    Expected Outcomes  Reach expected goals.       Core Components/Risk Factors/Patient Goals Review:  Goals and Risk Factor Review    Row Name 11/24/19 1539 12/15/19 1526           Core Components/Risk Factors/Patient Goals Review   Personal Goals Review  Weight Management/Obesity Get back to ADL's; and get back to her daily life. Breathe better.  Weight Management/Obesity Get bacl to ADL's; Get back to her daily life; breathe better.      Review  Patient is new to the program. She has completed 3 sessions. She does say however that she is doing more at home since she has been attending the program because she is not as afraid to do more. Will continue to monitor for progress.  Patient has completed 9 sessions gaining 3 llbs since she started the program. She is doing well in the program with  some progression. She says she has good days and bad days with her breathing. She reports recently being exposed to yard mowing which has exacerbated her SOB and wheezing. She had to use her MDI during the session today. She still however feels like she is making progress in the program having more "good" days and attempting to do more at  home. Will continue to monitor for progress.      Expected Outcomes  Patient will continue to attend sessions and complete the program meeting her personal goals.  Patient will continue to attend sessions and complete the program meeting her personal goals.         Core Components/Risk Factors/Patient Goals at Discharge (Final Review):  Goals and Risk Factor Review - 12/15/19 1526      Core Components/Risk Factors/Patient Goals Review   Personal Goals Review  Weight Management/Obesity   Get bacl to ADL's; Get back to her daily life; breathe better.   Review  Patient has completed 9 sessions gaining 3 llbs since she started the program. She is doing well in the program with some progression. She says she has good days and bad days with her breathing. She reports recently being exposed to yard mowing which has exacerbated her SOB and wheezing. She had to use her MDI during the session today. She still however feels like she is making progress in the program having more "good" days and attempting to do more at home. Will continue to monitor for progress.    Expected Outcomes  Patient will continue to attend sessions and complete the program meeting her personal goals.       ITP Comments: ITP Comments    Row Name 11/16/19 1027           ITP Comments  Patient is coming to use post COVID-19 with pneumonia which has left her extremely SOB on exertion. Patient is eager to get started.          Comments: ITP REVIEW Pt is making expected progress toward pulmonary rehab goals after completing 9 sessions. Recommend continued exercise, life style modification,  education, and utilization of breathing techniques to increase stamina and strength and decrease shortness of breath with exertion.

## 2019-12-15 NOTE — Progress Notes (Signed)
Daily Session Note  Patient Details  Name: Jane Bennett MRN: [id number] Date of Birth: 08-12-1966 Referring Provider:     PULMONARY REHAB OTHER RESP ORIENTATION from 11/16/2019 in [place]  Referring Provider  Dr. Loanne Drilling      Encounter Date: 12/15/2019  Check In: Session Check In - 12/15/19 1045      Check-In   Supervising physician immediately available to respond to emergencies  See telemetry face sheet for immediately available MD    Location  [facility]    Staff Present  [name], MS, EP, [facility], Exercise Physiologist;Cherryl Babin [name], RN, BSN    Virtual Visit  No    Medication changes reported      No    Fall or balance concerns reported     No    Tobacco Cessation  No Change    Warm-up and Cool-down  Performed as group-led instruction    Resistance Training Performed  Yes    VAD Patient?  No    PAD/SET Patient?  No      Pain Assessment   Currently in Pain?  No/denies    Pain Score  0-No pain    Multiple Pain Sites  No       Capillary Blood Glucose: No results found for this or any previous visit (from the past 24 hour(s)).    Social History   Tobacco Use  Smoking Status Never Smoker  Smokeless Tobacco Never Used    Goals Met:  Proper associated with RPD/PD & O2 Sat Independence with exercise equipment Improved SOB with ADL's Using PLB without cueing & demonstrates good technique Exercise tolerated well No report of cardiac concerns or symptoms Strength training completed today  Goals Unmet:  Not Applicable  Comments: Check out 1430.   Dr. Kate Sable is Medical Director for [facility] Cardiac and Pulmonary Rehab.

## 2019-12-20 ENCOUNTER — Other Ambulatory Visit: Payer: Self-pay

## 2019-12-20 ENCOUNTER — Encounter (HOSPITAL_COMMUNITY)
Admission: RE | Admit: 2019-12-20 | Discharge: 2019-12-20 | Disposition: A | Discharge status: Home / Self Care | Payer: No Typology Code available for payment source | Source: Ambulatory Visit | Attending: Pulmonary Disease | Admitting: Pulmonary Disease

## 2019-12-20 DIAGNOSIS — J410 Simple chronic bronchitis: Secondary | ICD-10-CM

## 2019-12-20 DIAGNOSIS — R0602 Shortness of breath: Secondary | ICD-10-CM | POA: Diagnosis not present

## 2019-12-20 NOTE — Progress Notes (Signed)
Daily Session Note  Patient Details  Name: Jane Bennett MRN: [id number] Date of Birth: 1965/11/19 Referring Provider:     PULMONARY REHAB OTHER RESP ORIENTATION from 11/16/2019 in [place]  Referring Provider  Dr. Loanne Drilling      Encounter Date: 12/20/2019  Check In: Session Check In - 12/20/19 1045      Check-In   Supervising physician immediately available to respond to emergencies  See telemetry face sheet for immediately available MD    Location  [facility]    Staff Present  [name], MS, EP, [facility], Exercise Physiologist;Other    Virtual Visit  No    Medication changes reported      No    Fall or balance concerns reported     No    Tobacco Cessation  No Change    Warm-up and Cool-down  Performed as group-led instruction    Resistance Training Performed  Yes    VAD Patient?  No    PAD/SET Patient?  No      Pain Assessment   Currently in Pain?  No/denies    Pain Score  0-No pain    Multiple Pain Sites  No       Capillary Blood Glucose: No results found for this or any previous visit (from the past 24 hour(s)).    Social History   Tobacco Use  Smoking Status Never Smoker  Smokeless Tobacco Never Used    Goals Met:  Proper associated with RPD/PD & O2 Sat Independence with exercise equipment Improved SOB with ADL's Using PLB without cueing & demonstrates good technique Exercise tolerated well Personal goals reviewed No report of cardiac concerns or symptoms Strength training completed today  Goals Unmet:  Not Applicable  Comments: check out 11:45   Dr. Kate Sable is Medical Director for [place] and Pulmonary Rehab.

## 2019-12-22 ENCOUNTER — Encounter (HOSPITAL_COMMUNITY)
Admission: RE | Admit: 2019-12-22 | Discharge: 2019-12-22 | Disposition: A | Discharge status: Home / Self Care | Payer: No Typology Code available for payment source | Source: Ambulatory Visit | Attending: Pulmonary Disease | Admitting: Pulmonary Disease

## 2019-12-22 ENCOUNTER — Other Ambulatory Visit: Payer: Self-pay

## 2019-12-22 DIAGNOSIS — J410 Simple chronic bronchitis: Secondary | ICD-10-CM | POA: Insufficient documentation

## 2019-12-22 DIAGNOSIS — R0602 Shortness of breath: Secondary | ICD-10-CM | POA: Diagnosis not present

## 2019-12-22 NOTE — Progress Notes (Signed)
Daily Session Note  Patient Details  Name: Jane Bennett MRN: [id number] Date of Birth: 06-19-1966 Referring Provider:     PULMONARY REHAB OTHER RESP ORIENTATION from 11/16/2019 in [place]  Referring Provider  Dr. Loanne Drilling      Encounter Date: 12/22/2019  Check In: Session Check In - 12/22/19 1045      Check-In   Supervising physician immediately available to respond to emergencies  See telemetry face sheet for immediately available MD    Location  [facility]    Staff Present  [name], MS, EP, [facility], Exercise Physiologist;Chieko Neises [name], RN, BSN    Virtual Visit  No    Medication changes reported      No    Fall or balance concerns reported     No    Tobacco Cessation  No Change    Warm-up and Cool-down  Performed as group-led instruction    Resistance Training Performed  Yes    VAD Patient?  No    PAD/SET Patient?  No      Pain Assessment   Currently in Pain?  No/denies    Pain Score  0-No pain    Multiple Pain Sites  No       Capillary Blood Glucose: No results found for this or any previous visit (from the past 24 hour(s)).    Social History   Tobacco Use  Smoking Status Never Smoker  Smokeless Tobacco Never Used    Goals Met:  Proper associated with RPD/PD & O2 Sat Independence with exercise equipment Improved SOB with ADL's Using PLB without cueing & demonstrates good technique Exercise tolerated well No report of cardiac concerns or symptoms Strength training completed today  Goals Unmet:  Not Applicable  Comments: Check out 1145.   Dr. Kate Sable is Medical Director for [facility] Cardiac and Pulmonary Rehab.

## 2019-12-27 ENCOUNTER — Encounter (HOSPITAL_COMMUNITY)
Admission: RE | Admit: 2019-12-27 | Discharge: 2019-12-27 | Disposition: A | Discharge status: Home / Self Care | Payer: No Typology Code available for payment source | Source: Ambulatory Visit | Attending: Pulmonary Disease | Admitting: Pulmonary Disease

## 2019-12-27 ENCOUNTER — Other Ambulatory Visit: Payer: Self-pay

## 2019-12-27 DIAGNOSIS — R0602 Shortness of breath: Secondary | ICD-10-CM | POA: Diagnosis not present

## 2019-12-27 NOTE — Progress Notes (Signed)
Daily Session Note  Patient Details  Name: Jane Bennett MRN: [id number] Date of Birth: 08-04-1966 Referring Provider:     PULMONARY REHAB OTHER RESP ORIENTATION from 11/16/2019 in [place]  Referring Provider  Dr. Loanne Drilling      Encounter Date: 12/27/2019  Check In: Session Check In - 12/27/19 1045      Check-In   Supervising physician immediately available to respond to emergencies  See telemetry face sheet for immediately available MD    Location  [facility]    Staff Present  [name], MS, EP, [facility], Exercise Physiologist;Other    Virtual Visit  No    Medication changes reported      No    Fall or balance concerns reported     No    Tobacco Cessation  No Change    Warm-up and Cool-down  Performed as group-led instruction    Resistance Training Performed  Yes    VAD Patient?  No    PAD/SET Patient?  No      Pain Assessment   Currently in Pain?  No/denies    Pain Score  0-No pain    Multiple Pain Sites  No       Capillary Blood Glucose: No results found for this or any previous visit (from the past 24 hour(s)).    Social History   Tobacco Use  Smoking Status Never Smoker  Smokeless Tobacco Never Used    Goals Met:  Proper associated with RPD/PD & O2 Sat Independence with exercise equipment Improved SOB with ADL's Using PLB without cueing & demonstrates good technique Exercise tolerated well Personal goals reviewed No report of cardiac concerns or symptoms Strength training completed today  Goals Unmet:  Not Applicable  Comments: check out 11:45   Dr. Kate Sable is Medical Director for [place] and Pulmonary Rehab.

## 2019-12-29 ENCOUNTER — Other Ambulatory Visit: Payer: Self-pay

## 2019-12-29 ENCOUNTER — Encounter (HOSPITAL_COMMUNITY)
Admission: RE | Admit: 2019-12-29 | Discharge: 2019-12-29 | Disposition: A | Discharge status: Home / Self Care | Payer: No Typology Code available for payment source | Source: Ambulatory Visit | Attending: Pulmonary Disease | Admitting: Pulmonary Disease

## 2019-12-29 DIAGNOSIS — R0602 Shortness of breath: Secondary | ICD-10-CM

## 2019-12-29 DIAGNOSIS — J410 Simple chronic bronchitis: Secondary | ICD-10-CM

## 2019-12-29 NOTE — Progress Notes (Signed)
Daily Session Note  Patient Details  Name: Jane Bennett MRN: [id number] Date of Birth: 1966/01/10 Referring Provider:     PULMONARY REHAB OTHER RESP ORIENTATION from 11/16/2019 in [place]  Referring Provider  Dr. Loanne Drilling      Encounter Date: 12/29/2019  Check In: Session Check In - 12/29/19 1045      Check-In   Supervising physician immediately available to respond to emergencies  See telemetry face sheet for immediately available MD    Location  [facility]    Staff Present  [name], MS, EP, [facility], Exercise Physiologist;Other;[name], RN, BSN    Virtual Visit  No    Medication changes reported      No    Fall or balance concerns reported     No    Tobacco Cessation  No Change    Warm-up and Cool-down  Performed as group-led instruction    Resistance Training Performed  Yes    VAD Patient?  No    PAD/SET Patient?  No      Pain Assessment   Currently in Pain?  No/denies    Pain Score  0-No pain    Multiple Pain Sites  No       Capillary Blood Glucose: No results found for this or any previous visit (from the past 24 hour(s)).    Social History   Tobacco Use  Smoking Status Never Smoker  Smokeless Tobacco Never Used    Goals Met:  Proper associated with RPD/PD & O2 Sat Independence with exercise equipment Improved SOB with ADL's Using PLB without cueing & demonstrates good technique Exercise tolerated well No report of cardiac concerns or symptoms Strength training completed today  Goals Unmet:  Not Applicable  Comments: Check out 1145.   Dr. Kate Sable is Medical Director for [facility] Cardiac and Pulmonary Rehab.

## 2020-01-03 ENCOUNTER — Other Ambulatory Visit (HOSPITAL_COMMUNITY): Payer: No Typology Code available for payment source

## 2020-01-03 ENCOUNTER — Encounter (HOSPITAL_COMMUNITY)
Admission: RE | Admit: 2020-01-03 | Discharge: 2020-01-03 | Disposition: A | Discharge status: Home / Self Care | Payer: No Typology Code available for payment source | Source: Ambulatory Visit | Attending: Pulmonary Disease | Admitting: Pulmonary Disease

## 2020-01-03 ENCOUNTER — Other Ambulatory Visit (HOSPITAL_COMMUNITY)
Admission: RE | Admit: 2020-01-03 | Discharge: 2020-01-03 | Disposition: A | Discharge status: Home / Self Care | Payer: HRSA Program | Source: Ambulatory Visit | Attending: Primary Care | Admitting: Primary Care

## 2020-01-03 ENCOUNTER — Other Ambulatory Visit: Payer: Self-pay

## 2020-01-03 DIAGNOSIS — Z20822 Contact with and (suspected) exposure to covid-19: Secondary | ICD-10-CM | POA: Insufficient documentation

## 2020-01-03 DIAGNOSIS — Z01812 Encounter for preprocedural laboratory examination: Secondary | ICD-10-CM | POA: Diagnosis present

## 2020-01-03 DIAGNOSIS — R0602 Shortness of breath: Secondary | ICD-10-CM | POA: Diagnosis not present

## 2020-01-03 NOTE — Progress Notes (Signed)
Daily Session Note  Patient Details  Name: Jane Bennett MRN: [id number] Date of Birth: 1966/04/20 Referring Provider:     PULMONARY REHAB OTHER RESP ORIENTATION from 11/16/2019 in [place]  Referring Provider  Dr. Loanne Drilling      Encounter Date: 01/03/2020  Check In: Session Check In - 01/03/20 1045      Check-In   Supervising physician immediately available to respond to emergencies  See telemetry face sheet for immediately available MD    Location  [facility]    Staff Present  [name], MS, EP, [facility], Exercise Physiologist;Other    Virtual Visit  No    Medication changes reported      No    Fall or balance concerns reported     No    Tobacco Cessation  No Change    Warm-up and Cool-down  Performed as group-led instruction    Resistance Training Performed  Yes    VAD Patient?  No    PAD/SET Patient?  No      Pain Assessment   Currently in Pain?  No/denies    Pain Score  0-No pain    Multiple Pain Sites  No       Capillary Blood Glucose: No results found for this or any previous visit (from the past 24 hour(s)).    Social History   Tobacco Use  Smoking Status Never Smoker  Smokeless Tobacco Never Used    Goals Met:  Proper associated with RPD/PD & O2 Sat Independence with exercise equipment Improved SOB with ADL's Using PLB without cueing & demonstrates good technique Exercise tolerated well Personal goals reviewed No report of cardiac concerns or symptoms Strength training completed today  Goals Unmet:  Not Applicable  Comments: check out 11:45   Dr. Kate Sable is Medical Director for [place] and Pulmonary Rehab.

## 2020-01-04 LAB — SARS CORONAVIRUS 2 (TAT 6-24 HRS): SARS Coronavirus 2: NEGATIVE

## 2020-01-05 ENCOUNTER — Encounter (HOSPITAL_COMMUNITY): Payer: No Typology Code available for payment source

## 2020-01-06 ENCOUNTER — Other Ambulatory Visit: Payer: Self-pay

## 2020-01-06 ENCOUNTER — Ambulatory Visit (INDEPENDENT_AMBULATORY_CARE_PROVIDER_SITE_OTHER): Payer: No Typology Code available for payment source | Admitting: Primary Care

## 2020-01-06 ENCOUNTER — Ambulatory Visit (INDEPENDENT_AMBULATORY_CARE_PROVIDER_SITE_OTHER): Payer: No Typology Code available for payment source | Admitting: Pulmonary Disease

## 2020-01-06 ENCOUNTER — Encounter: Payer: Self-pay | Admitting: Primary Care

## 2020-01-06 VITALS — BP 130/80 | HR 96 | Temp 98.1°F | Ht 62.0 in | Wt 228.0 lb

## 2020-01-06 DIAGNOSIS — R062 Wheezing: Secondary | ICD-10-CM | POA: Diagnosis not present

## 2020-01-06 DIAGNOSIS — J302 Other seasonal allergic rhinitis: Secondary | ICD-10-CM

## 2020-01-06 DIAGNOSIS — J383 Other diseases of vocal cords: Secondary | ICD-10-CM

## 2020-01-06 DIAGNOSIS — J410 Simple chronic bronchitis: Secondary | ICD-10-CM

## 2020-01-06 DIAGNOSIS — J4541 Moderate persistent asthma with (acute) exacerbation: Secondary | ICD-10-CM

## 2020-01-06 LAB — PULMONARY FUNCTION TEST
DL/VA % pred: 133 %
DL/VA: 5.79 ml/min/mmHg/L
DLCO cor % pred: 104 %
DLCO cor: 20.2 ml/min/mmHg
DLCO unc % pred: 104 %
DLCO unc: 20.2 ml/min/mmHg
FEF 25-75 Post: 1.33 L/sec
FEF 25-75 Pre: 0.43 L/sec
FEF2575-%Change-Post: 207 %
FEF2575-%Pred-Post: 52 %
FEF2575-%Pred-Pre: 17 %
FEV1-%Change-Post: 25 %
FEV1-%Pred-Post: 41 %
FEV1-%Pred-Pre: 33 %
FEV1-Post: 1.06 L
FEV1-Pre: 0.84 L
FEV1FVC-%Change-Post: 19 %
FEV1FVC-%Pred-Pre: 88 %
FEV6-%Change-Post: 6 %
FEV6-%Pred-Post: 40 %
FEV6-%Pred-Pre: 37 %
FEV6-Post: 1.27 L
FEV6-Pre: 1.19 L
FEV6FVC-%Change-Post: 1 %
FEV6FVC-%Pred-Post: 103 %
FEV6FVC-%Pred-Pre: 101 %
FVC-%Change-Post: 5 %
FVC-%Pred-Post: 39 %
FVC-%Pred-Pre: 37 %
FVC-Post: 1.27 L
FVC-Pre: 1.2 L
Post FEV1/FVC ratio: 84 %
Post FEV6/FVC ratio: 100 %
Pre FEV1/FVC ratio: 70 %
Pre FEV6/FVC Ratio: 99 %
RV % pred: 91 %
RV: 1.6 L
TLC % pred: 83 %
TLC: 3.97 L

## 2020-01-06 LAB — BASIC METABOLIC PANEL
BUN: 13 mg/dL (ref 6–23)
CO2: 30 mEq/L (ref 19–32)
Calcium: 9.6 mg/dL (ref 8.4–10.5)
Chloride: 101 mEq/L (ref 96–112)
Creatinine, Ser: 0.85 mg/dL (ref 0.40–1.20)
GFR: 69.76 mL/min (ref >60.00)
Glucose, Bld: 96 mg/dL (ref 70–99)
Potassium: 4 mEq/L (ref 3.5–5.1)
Sodium: 139 mEq/L (ref 135–145)

## 2020-01-06 MED ORDER — SPIRIVA RESPIMAT 1.25 MCG/ACT IN AERS: 2.0000 | INHALATION_SPRAY | Freq: Every day | RESPIRATORY_TRACT | 0 refills | Status: DC

## 2020-01-06 MED ORDER — CETIRIZINE HCL 10 MG PO CAPS: 1.0000 | ORAL_CAPSULE | Freq: Every day | ORAL | 1 refills | Status: DC

## 2020-01-06 MED ORDER — PREDNISONE 5 MG (21) PO TBPK: ORAL_TABLET | ORAL | 0 refills | Status: AC

## 2020-01-06 MED ORDER — FLUTICASONE PROPIONATE 50 MCG/ACT NA SUSP: 1.0000 | Freq: Every day | NASAL | 2 refills | Status: DC

## 2020-01-06 NOTE — Progress Notes (Signed)
PFT done today. 

## 2020-01-06 NOTE — Addendum Note (Signed)
Addended by: Demetrio Lapping E on: 01/06/2020 11:21 AM   Modules accepted: Orders

## 2020-01-06 NOTE — Progress Notes (Signed)
@Patient  ID: Jane Bennett, female    DOB: [birth year], 54 y.o.   MRN: [id number]  Chief Complaint  Patient presents with  . Follow-up    f/u PFT/ Wheezing     Referring provider: [facility], [facility]  HPI: Patient is a 54 yo female, never smoker. PMH significant for hx of covid 19 pneumonia. Patient of Dr. Loanne Drilling and last seen on 11/02/2019 for wheezing. These symptoms were persistent after the patient had covid-19 pneumonia from 09/13/2019.  Patient reports associated SOB with exertion. Possible DVT in January of 2021, started her on Xarelto prophylactically. Patient was started on Advair 230 mcg 2 puffs BID. Referred to pulmonary rehab and ENT for upper airway wheezing. Ordered for PFTs and DVT ultrasound.  01/06/2020 Patient here for follow up office visit and PFT. She has seen some minor improvement but is still having persistent audible upper airway wheezing and shortness of breath with any exertion. Heat exacerbates her dyspnea. She will wake up at least once a night with shortness of breath having to use rescue inhaler. She sleeps with 3 pillows. Patient was using albuterol 6 times a day and now only uses albuterol 2-3 times a day since starting advair.   She does complain of some allergy symptoms today such as watery eyes, post-nasal drip. No sneezing or congestion. Interested in possibly trying an allergy medication.  Denies any GERD symptoms. No heart burn, belching, acid indigestion.   She saw ENT on 11/30/2019 at [facility] with Dr. Redmond Baseman. 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 He referred her to speech pathology. Patient states case worker has not set up appointment with speech pathology and it's been over a month.   Pulmonary rehab started in November 16, 2019. Doing exercises to strenthen chest muscles to help with deep breathing. Still gets sob with exertion and hasn't been able  to do treadmill exercises yet.   At the end of the visit, workers comp Tourist information centre manager brought in at end of visit which was approved by patient to be filled in on HPI.  PFT results 01/06/2020:  Results indicate moderate obstruction with reversibility indicative of moderate asthma. Pre-FVC 1.20 (37%) Pre-FEV1 0.84 (33%); post-FEV1 1.06 (41%) +25 Pre-Fev1/FVC 70 (88%); post-Fev1/FVC 84 (105%) +19 TLC 3.97 (83%) DLCO 20.20 (104%)  Imaging: 11/04/2019 (DVT ultrasound right lower extremity): No evidence of acute or chronic DVT within the right lower extremity. 10/20/2019: CXR lungs are normal; no pleural effusion.  Allergies  Allergen Reactions  . Other Hives  . Sulfa Antibiotics Hives  . Bee Venom Hives    Immunization History  Administered Date(s) Administered  . Influenza Whole 07/04/2019    Past Medical History:  Diagnosis Date  . DVT (deep venous thrombosis) ([place])   . High cholesterol   . Pneumonia     Tobacco History: Social History   Tobacco Use  Smoking Status Never Smoker  Smokeless Tobacco Never Used   Counseling given: Not Answered   Outpatient Medications Prior to Visit  Medication Sig Dispense Refill  . albuterol (VENTOLIN HFA) 108 (90 Base) MCG/ACT inhaler INHALE 1 TO 2 PUFFS BY MOUTH EVERY 4 TO 6 HOURS    . fluticasone-salmeterol (ADVAIR HFA) 230-21 MCG/ACT inhaler Inhale 2 puffs into the lungs 2 (two) times daily. 1 Inhaler 12  . losartan-hydrochlorothiazide (HYZAAR) 50-12.5 MG tablet Take 1 tablet by mouth daily.    . ondansetron (ZOFRAN-ODT) 8 MG disintegrating tablet Take  8 mg by mouth every 6 (six) hours as needed.    . rivaroxaban (XARELTO) 20 MG TABS tablet Take 20 mg by mouth daily with supper.     No facility-administered medications prior to visit.      Review of Systems  Review of Systems  Constitutional: Negative for chills, fatigue, fever and unexpected weight change.  HENT: Positive for postnasal drip. Negative for congestion and sneezing.    Eyes: Positive for itching.  Respiratory: Positive for cough, chest tightness, shortness of breath and wheezing. Negative for apnea.        Audible wheezing.  Cardiovascular: Negative for chest pain, palpitations and leg swelling.  Allergic/Immunologic: Negative for environmental allergies.     Physical Exam  BP 130/80 (BP Location: Right Arm, Patient Position: Sitting, Cuff Size: Large)   Pulse 96   Temp 98.1 F (36.7 C) (Temporal)   Ht 5\' 2"  (1.575 m)   Wt 228 lb (103.4 kg)   SpO2 97% Comment: room air  BMI 41.70 kg/m  Physical Exam Constitutional:      Appearance: Normal appearance.  HENT:     Head: Normocephalic.     Mouth/Throat:     Mouth: Mucous membranes are moist.     Comments: mallampatie score of 3 Cardiovascular:     Rate and Rhythm: Normal rate and regular rhythm.  Pulmonary:     Effort: Pulmonary effort is normal.     Breath sounds: Wheezing present. No rhonchi or rales.     Comments: Dimished BS bilaterally Upper airway wheeze Skin:    General: Skin is warm.  Neurological:     Mental Status: She is alert.  Psychiatric:        Mood and Affect: Mood normal.        Behavior: Behavior normal.      Lab Results:  CBC    Component Value Date/Time   WBC 8.7 10/11/2019 2010   RBC 4.80 10/11/2019 2010   HGB 13.5 10/11/2019 2010   HCT 42.8 10/11/2019 2010   PLT 223 10/11/2019 2010   MCV 89.2 10/11/2019 2010   MCH 28.1 10/11/2019 2010   MCHC 31.5 10/11/2019 2010   RDW 15.1 10/11/2019 2010   LYMPHSABS 3.5 10/11/2019 2010   MONOABS 0.5 10/11/2019 2010   EOSABS 0.2 10/11/2019 2010   BASOSABS 0.1 10/11/2019 2010    BMET    Component Value Date/Time   NA 139 01/06/2020 1122   K 4.0 01/06/2020 1122   CL 101 01/06/2020 1122   CO2 30 01/06/2020 1122   GLUCOSE 96 01/06/2020 1122   BUN 13 01/06/2020 1122   CREATININE 0.85 01/06/2020 1122   CALCIUM 9.6 01/06/2020 1122   GFRNONAA >60 10/11/2019 2010   GFRAA >60 10/11/2019 2010    BNP No  results found for: BNP  ProBNP No results found for: PROBNP  Imaging: No results found.   Assessment & Plan:   Moderate persistent asthma - PFTs today showed severe obstructive asthma\ FEV1 1.06 (41%), ratio 84 - Continue Advair  230 mcg 2 puffs BID. Will give patient a Spacer to use with hfa (use with inhalers to increase aerosol deposition) - Started Spiriva Respimat 2 puffs daily. Samples given - RX Prednisone 20 mg x 5 day, 10 mg x 5 day, then 5 mg x 5 days - Follow up with speech pathology per ENT - Continue albuterol 2 puffs every 4 hours as needed for wheezing - Continue pulmonary rehab - Follow up in 2 weeks with [facility]  [facility] [place] (Televisit is okay)  Seasonal allergies - Cetirizine 10 mg once daily - Flonase 1 puff each nare daily - Checking CBC with diff and IgE  Vocal cord dysfunction - Seen by Dr. 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 Jenne Pane assessment and feel strongly that this will help with her rehabilitation. She has moderate to severe restrictive lung disease d/t previous covid-19 pneumonia. She had no know breathing difficulties prior to this.      Glenford Bayley, NP 01/06/2020

## 2020-01-06 NOTE — Patient Instructions (Addendum)
Orders: Spacer (use with inhalers) Spiriva Respimat 2 puffs daily. Ceitirizine once daily for allergies Flonase 1 puff each nare daily Prednisone 20 mg 5 day, 10 mg 5 day, then 5 mg days  Follow up with vocal cord therapy according to ENT Continue albuterol 2 puffs every 4 hours as needed Continue pulmonary rehab  Follow up in 2 weeks with Buelah Manis (Televisit is okay)   Asthma, Adult  Asthma is a long-term (chronic) condition in which the airways get tight and narrow. The airways are the breathing passages that lead from the nose and mouth down into the lungs. A person with asthma will have times when symptoms get worse. These are called asthma attacks. They can cause coughing, whistling sounds when you breathe (wheezing), shortness of breath, and chest pain. They can make it hard to breathe. There is no cure for asthma, but medicines and lifestyle changes can help control it. There are many things that can bring on an asthma attack or make asthma symptoms worse (triggers). Common triggers include:  Mold.  Dust.  Cigarette smoke.  Cockroaches.  Things that can cause allergy symptoms (allergens). These include animal skin flakes (dander) and pollen from trees or grass.  Things that pollute the air. These may include household cleaners, wood smoke, smog, or chemical odors.  Cold air, weather changes, and wind.  Crying or laughing hard.  Stress.  Certain medicines or drugs.  Certain foods such as dried fruit, potato chips, and grape juice.  Infections, such as a cold or the flu.  Certain medical conditions or diseases.  Exercise or tiring activities. Asthma may be treated with medicines and by staying away from the things that cause asthma attacks. Types of medicines may include:  Controller medicines. These help prevent asthma symptoms. They are usually taken every day.  Fast-acting reliever or rescue medicines. These quickly relieve asthma symptoms. They are used as  needed and provide short-term relief.  Allergy medicines if your attacks are brought on by allergens.  Medicines to help control the body's defense (immune) system. Follow these instructions at home: Avoiding triggers in your home  Change your heating and air conditioning filter often.  Limit your use of fireplaces and wood stoves.  Get rid of pests (such as roaches and mice) and their droppings.  Throw away plants if you see mold on them.  Clean your floors. Dust regularly. Use cleaning products that do not smell.  Have someone vacuum when you are not home. Use a vacuum cleaner with a HEPA filter if possible.  Replace carpet with wood, tile, or vinyl flooring. Carpet can trap animal skin flakes and dust.  Use allergy-proof pillows, mattress covers, and box spring covers.  Wash bed sheets and blankets every week in hot water. Dry them in a dryer.  Keep your bedroom free of any triggers.  Avoid pets and keep windows closed when things that cause allergy symptoms are in the air.  Use blankets that are made of polyester or cotton.  Clean bathrooms and kitchens with bleach. If possible, have someone repaint the walls in these rooms with mold-resistant paint. Keep out of the rooms that are being cleaned and painted.  Wash your hands often with soap and water. If soap and water are not available, use hand sanitizer.  Do not allow anyone to smoke in your home. General instructions  Take over-the-counter and prescription medicines only as told by your doctor. ? Talk with your doctor if you have questions about how or  when to take your medicines. ? Make note if you need to use your medicines more often than usual.  Do not use any products that contain nicotine or tobacco, such as cigarettes and e-cigarettes. If you need help quitting, ask your doctor.  Stay away from secondhand smoke.  Avoid doing things outdoors when allergen counts are high and when air quality is low.  Wear  a ski mask when doing outdoor activities in the winter. The mask should cover your nose and mouth. Exercise indoors on cold days if you can.  Warm up before you exercise. Take time to cool down after exercise.  Use a peak flow meter as told by your doctor. A peak flow meter is a tool that measures how well the lungs are working.  Keep track of the peak flow meter's readings. Write them down.  Follow your asthma action plan. This is a written plan for taking care of your asthma and treating your attacks.  Make sure you get all the shots (vaccines) that your doctor recommends. Ask your doctor about a flu shot and a pneumonia shot.  Keep all follow-up visits as told by your doctor. This is important. Contact a doctor if:  You have wheezing, shortness of breath, or a cough even while taking medicine to prevent attacks.  The mucus you cough up (sputum) is thicker than usual.  The mucus you cough up changes from clear or white to yellow, green, gray, or bloody.  You have problems from the medicine you are taking, such as: ? A rash. ? Itching. ? Swelling. ? Trouble breathing.  You need reliever medicines more than 2-3 times a week.  Your peak flow reading is still at 50-79% of your personal best after following the action plan for 1 hour.  You have a fever. Get help right away if:  You seem to be worse and are not responding to medicine during an asthma attack.  You are short of breath even at rest.  You get short of breath when doing very little activity.  You have trouble eating, drinking, or talking.  You have chest pain or tightness.  You have a fast heartbeat.  Your lips or fingernails start to turn blue.  You are light-headed or dizzy, or you faint.  Your peak flow is less than 50% of your personal best.  You feel too tired to breathe normally. Summary  Asthma is a long-term (chronic) condition in which the airways get tight and narrow. An asthma attack can make  it hard to breathe.  Asthma cannot be cured, but medicines and lifestyle changes can help control it.  Make sure you understand how to avoid triggers and how and when to use your medicines. This information is not intended to replace advice given to you by your health care provider. Make sure you discuss any questions you have with your health care provider. Document Revised: 11/11/2018 Document Reviewed: 10/13/2016 Elsevier Patient Education  2020 [employer].

## 2020-01-06 NOTE — Assessment & Plan Note (Addendum)
-   Cetirizine 10 mg once daily - Flonase 1 puff each nare daily - Checking CBC with diff and IgE

## 2020-01-06 NOTE — Assessment & Plan Note (Signed)
-   Seen by Dr. 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 Jenne Pane assessment and feel strongly that this will help with her rehabilitation. She has moderate to severe restrictive lung disease d/t previous covid-19 pneumonia. She had no know breathing difficulties prior to this.

## 2020-01-06 NOTE — Assessment & Plan Note (Addendum)
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 Will give patient a Spacer to use with hfa (use with inhalers to increase aerosol deposition) - Started Spiriva Respimat 2 puffs daily. Samples given - RX Prednisone 20 mg x 5 day, 10 mg x 5 day, then 5 mg x 5 days - Follow up with speech pathology per ENT - Continue albuterol 2 puffs every 4 hours as needed for wheezing - Continue pulmonary rehab - Follow up in 2 weeks with Jane Bennett (Televisit is okay)

## 2020-01-09 LAB — IGE: IgE (Immunoglobulin E), Serum: 18 kU/L (ref <=114)

## 2020-01-09 MED ORDER — ALBUTEROL SULFATE HFA 108 (90 BASE) MCG/ACT IN AERS: INHALATION_SPRAY | RESPIRATORY_TRACT | 3 refills | Status: DC

## 2020-01-10 ENCOUNTER — Encounter (HOSPITAL_COMMUNITY)
Admission: RE | Admit: 2020-01-10 | Discharge: 2020-01-10 | Disposition: A | Discharge status: Home / Self Care | Payer: No Typology Code available for payment source | Source: Ambulatory Visit | Attending: Pulmonary Disease | Admitting: Pulmonary Disease

## 2020-01-10 ENCOUNTER — Other Ambulatory Visit: Payer: Self-pay

## 2020-01-10 DIAGNOSIS — R0602 Shortness of breath: Secondary | ICD-10-CM | POA: Diagnosis not present

## 2020-01-10 NOTE — Progress Notes (Signed)
Daily Session Note  Patient Details  Name: Jane Bennett MRN: [id number] Date of Birth: Nov 24, 1965 Referring Provider:     PULMONARY REHAB OTHER RESP ORIENTATION from 11/16/2019 in [place]  Referring Provider  Dr. Loanne Drilling      Encounter Date: 01/10/2020  Check In: Session Check In - 01/10/20 1045      Check-In   Supervising physician immediately available to respond to emergencies  See telemetry face sheet for immediately available MD    Location  [facility]    Staff Present  [name], MS, EP, [facility], Exercise Physiologist;Other    Virtual Visit  No    Medication changes reported      Yes    Comments  Medications added Spiriva, Zyrtec HCZ Flonase Nasel Spray, Prednisone    Fall or balance concerns reported     No    Tobacco Cessation  No Change    Warm-up and Cool-down  Performed as group-led instruction    Resistance Training Performed  Yes    VAD Patient?  No    PAD/SET Patient?  No      Pain Assessment   Currently in Pain?  No/denies    Pain Score  0-No pain    Multiple Pain Sites  No       Capillary Blood Glucose: No results found for this or any previous visit (from the past 24 hour(s)).    Social History   Tobacco Use  Smoking Status Never Smoker  Smokeless Tobacco Never Used    Goals Met:  Proper associated with RPD/PD & O2 Sat Independence with exercise equipment Improved SOB with ADL's Using PLB without cueing & demonstrates good technique Exercise tolerated well Personal goals reviewed No report of cardiac concerns or symptoms Strength training completed today  Goals Unmet:  Not Applicable  Comments: check out 11:45   Dr. Kate Sable is Medical Director for [place] and Pulmonary Rehab.

## 2020-01-12 ENCOUNTER — Encounter (HOSPITAL_COMMUNITY)
Admission: RE | Admit: 2020-01-12 | Discharge: 2020-01-12 | Disposition: A | Discharge status: Home / Self Care | Payer: No Typology Code available for payment source | Source: Ambulatory Visit | Attending: Pulmonary Disease | Admitting: Pulmonary Disease

## 2020-01-12 ENCOUNTER — Other Ambulatory Visit: Payer: Self-pay

## 2020-01-12 DIAGNOSIS — R0602 Shortness of breath: Secondary | ICD-10-CM

## 2020-01-12 DIAGNOSIS — J410 Simple chronic bronchitis: Secondary | ICD-10-CM

## 2020-01-13 NOTE — Progress Notes (Signed)
Pulmonary Individual Treatment Plan  Patient Details  Name: Jane Bennett MRN: [id number] Date of Birth: [birth year] Referring Provider:     PULMONARY REHAB OTHER RESP ORIENTATION from 11/16/2019 in [facility]  Referring Provider  Dr. Loanne Drilling      Initial Encounter Date:    [facility] from 11/16/2019 in [facility]  Date  11/16/19      Visit Diagnosis: SOB (shortness of breath)  Simple chronic bronchitis ([place])  Patient's Home Medications on Admission:   Current Outpatient Medications:  .  albuterol (VENTOLIN HFA) 108 (90 Base) MCG/ACT inhaler, INHALE 1 TO 2 PUFFS BY MOUTH EVERY 4 TO 6 HOURS, Disp: 18 g, Rfl: 3 .  Cetirizine HCl 10 MG CAPS, Take 1 capsule (10 mg total) by mouth daily., Disp: 30 capsule, Rfl: 1 .  fluticasone (FLONASE) 50 MCG/ACT nasal spray, Place 1 spray into both nostrils daily., Disp: 16 g, Rfl: 2 .  fluticasone-salmeterol (ADVAIR HFA) 230-21 MCG/ACT inhaler, Inhale 2 puffs into the lungs 2 (two) times daily., Disp: 1 Inhaler, Rfl: 12 .  losartan-hydrochlorothiazide (HYZAAR) 50-12.5 MG tablet, Take 1 tablet by mouth daily., Disp: , Rfl:  .  ondansetron (ZOFRAN-ODT) 8 MG disintegrating tablet, Take 8 mg by mouth every 6 (six) hours as needed., Disp: , Rfl:  .  predniSONE (STERAPRED UNI-PAK 21 TAB) 5 MG (21) TBPK tablet, Take 4 tablets (20 mg total) by mouth daily for 5 days, THEN 2 tablets (10 mg total) daily for 5 days, THEN 1 tablet (5 mg total) daily for 5 days., Disp: 35 tablet, Rfl: 0 .  rivaroxaban (XARELTO) 20 MG TABS tablet, Take 20 mg by mouth daily with supper., Disp: , Rfl:  .  Tiotropium Bromide Monohydrate (SPIRIVA RESPIMAT) 1.25 MCG/ACT AERS, Inhale 2 puffs into the lungs daily., Disp: 8 g, Rfl: 0  Past Medical History: Past Medical History:  Diagnosis Date  . DVT (deep venous thrombosis) ([place])   . High cholesterol   . Pneumonia     Tobacco Use: Social History   Tobacco  Use  Smoking Status Never Smoker  Smokeless Tobacco Never Used    Labs: Recent Review Flowsheet Data    There is no flowsheet data to display.      Capillary Blood Glucose: No results found for: GLUCAP   Pulmonary Assessment Scores: Pulmonary Assessment Scores    Row Name 11/16/19 1117         ADL UCSD   ADL Phase  Entry     SOB Score total  90     Rest  0     Walk  11     Stairs  5     Bath  4     Dress  5     Shop  5       CAT Score   CAT Score  33       mMRC Score   mMRC Score  4       UCSD: Self-administered rating of dyspnea associated with activities of daily living (ADLs) 6-point scale (0 = "not at all" to 5 = "maximal or unable to do because of breathlessness")  Scoring Scores range from 0 to 120.  Minimally important difference is 5 units  CAT: CAT can identify the health impairment of COPD patients and is better correlated with disease progression.  CAT has a scoring range of zero to 40. The CAT score is classified into four groups of low (less than  10), medium (10 - 20), high (21-30) and very high (31-40) based on the impact level of disease on health status. A CAT score over 10 suggests significant symptoms.  A worsening CAT score could be explained by an exacerbation, poor medication adherence, poor inhaler technique, or progression of COPD or comorbid conditions.  CAT MCID is 2 points  mMRC: mMRC (Modified Medical Research Council) Dyspnea Scale is used to assess the degree of baseline functional disability in patients of respiratory disease due to dyspnea. No minimal important difference is established. A decrease in score of 1 point or greater is considered a positive change.   Pulmonary Function Assessment:   Exercise Target Goals: Exercise Program Goal: Individual exercise prescription set using results from initial 6 min walk test and THRR while considering  patient's activity barriers and safety.   Exercise Prescription Goal: Initial  exercise prescription builds to 30-45 minutes a day of aerobic activity, 2-3 days per week.  Home exercise guidelines will be given to patient during program as part of exercise prescription that the participant will acknowledge.  Activity Barriers & Risk Stratification: Activity Barriers & Cardiac Risk Stratification - 11/16/19 1103      Activity Barriers & Cardiac Risk Stratification   Activity Barriers  Shortness of Breath    Cardiac Risk Stratification  Moderate       6 Minute Walk: 6 Minute Walk    Row Name 11/16/19 1058         6 Minute Walk   Phase  Initial     Distance  700 feet     Walk Time  6 minutes     # of Rest Breaks  1     MPH  1.32     METS  2.01     RPE  14     Perceived Dyspnea   16     VO2 Peak  0.56     Symptoms  Yes (comment)     Comments  Extreme SOB. Had to stop to rest for 2 minutes then was able to finish the test.     Resting HR  83 bpm     Resting BP  170/98     Resting Oxygen Saturation   97 %     Exercise Oxygen Saturation  during 6 min walk  96 %     Max Ex. HR  107 bpm     Max Ex. BP  198/120     2 Minute Post BP  158/98        Oxygen Initial Assessment: Oxygen Initial Assessment - 11/16/19 1116      Home Oxygen   Home Oxygen Device  None    Sleep Oxygen Prescription  None    Home Exercise Oxygen Prescription  None    Home at Rest Exercise Oxygen Prescription  None      Initial 6 min Walk   Oxygen Used  None      Program Oxygen Prescription   Program Oxygen Prescription  None       Oxygen Re-Evaluation: Oxygen Re-Evaluation    Row Name 11/24/19 1537 12/15/19 1525 01/13/20 1413         Program Oxygen Prescription   Program Oxygen Prescription  None  None  None       Home Oxygen   Home Oxygen Device  None  None  None     Sleep Oxygen Prescription  None  None  None     Home Exercise Oxygen Prescription  None  None  None     Home at Rest Exercise Oxygen Prescription  None  None  None     Compliance with Home Oxygen Use   Yes  Yes  Yes       Goals/Expected Outcomes   Short Term Goals  To learn and exhibit compliance with exercise, home and travel O2 prescription;To learn and understand importance of monitoring SPO2 with pulse oximeter and demonstrate accurate use of the pulse oximeter.;To learn and understand importance of maintaining oxygen saturations>88%;To learn and demonstrate proper pursed lip breathing techniques or other breathing techniques.  To learn and exhibit compliance with exercise, home and travel O2 prescription;To learn and understand importance of monitoring SPO2 with pulse oximeter and demonstrate accurate use of the pulse oximeter.;To learn and understand importance of maintaining oxygen saturations>88%;To learn and demonstrate proper pursed lip breathing techniques or other breathing techniques.  To learn and exhibit compliance with exercise, home and travel O2 prescription;To learn and understand importance of monitoring SPO2 with pulse oximeter and demonstrate accurate use of the pulse oximeter.;To learn and understand importance of maintaining oxygen saturations>88%;To learn and demonstrate proper pursed lip breathing techniques or other breathing techniques.     Long  Term Goals  Exhibits compliance with exercise, home and travel O2 prescription;Verbalizes importance of monitoring SPO2 with pulse oximeter and return demonstration;Maintenance of O2 saturations>88%;Exhibits proper breathing techniques, such as pursed lip breathing or other method taught during program session;Compliance with respiratory medication  Exhibits compliance with exercise, home and travel O2 prescription;Verbalizes importance of monitoring SPO2 with pulse oximeter and return demonstration;Maintenance of O2 saturations>88%;Exhibits proper breathing techniques, such as pursed lip breathing or other method taught during program session;Compliance with respiratory medication  Exhibits compliance with exercise, home and travel  O2 prescription;Verbalizes importance of monitoring SPO2 with pulse oximeter and return demonstration;Maintenance of O2 saturations>88%;Exhibits proper breathing techniques, such as pursed lip breathing or other method taught during program session;Compliance with respiratory medication     Comments  Patient is able to verbalize the importance of monitoring her SPO2 and maintaining her O2 saturation >88%. She is also demonstrate proper pursed lip breathing technique in class and is compliant with her exercise prescription and medications.  Patient is able to verbalize the importance of monitoring her SPO2 and maintaining her O2 saturation >88% and to demonstrate proper usage of pulse oximeter. She is also able to demonstrate proper pursed lip breathing technique in class and is compliant with her exercise prescription and medications.  Patient is able to verbalize the importance of monitoring her SPO2 and maintaining her O2 saturation >88% and to demonstrate proper usage of pulse oximeter. She is also able to demonstrate proper pursed lip breathing technique in class and is compliant with her exercise prescription and medications.     Goals/Expected Outcomes  Patient is meeting her expected goals and outcomes. Will continue to monitor.  Patient is meeting her expected goals and outcomes. Will continue to monitor.  Patient is meeting her expected goals and outcomes. Will continue to monitor.        Oxygen Discharge (Final Oxygen Re-Evaluation): Oxygen Re-Evaluation - 01/13/20 1413      Program Oxygen Prescription   Program Oxygen Prescription  None      Home Oxygen   Home Oxygen Device  None    Sleep Oxygen Prescription  None    Home Exercise Oxygen Prescription  None    Home at Rest Exercise Oxygen Prescription  None    Compliance with Home Oxygen  Use  Yes      Goals/Expected Outcomes   Short Term Goals  To learn and exhibit compliance with exercise, home and travel O2 prescription;To learn and  understand importance of monitoring SPO2 with pulse oximeter and demonstrate accurate use of the pulse oximeter.;To learn and understand importance of maintaining oxygen saturations>88%;To learn and demonstrate proper pursed lip breathing techniques or other breathing techniques.    Long  Term Goals  Exhibits compliance with exercise, home and travel O2 prescription;Verbalizes importance of monitoring SPO2 with pulse oximeter and return demonstration;Maintenance of O2 saturations>88%;Exhibits proper breathing techniques, such as pursed lip breathing or other method taught during program session;Compliance with respiratory medication    Comments  Patient is able to verbalize the importance of monitoring her SPO2 and maintaining her O2 saturation >88% and to demonstrate proper usage of pulse oximeter. She is also able to demonstrate proper pursed lip breathing technique in class and is compliant with her exercise prescription and medications.    Goals/Expected Outcomes  Patient is meeting her expected goals and outcomes. Will continue to monitor.       Initial Exercise Prescription: Initial Exercise Prescription - 11/16/19 1100      Date of Initial Exercise RX and Referring Provider   Date  11/16/19    Referring Provider  Dr. Everardo All    Expected Discharge Date  02/13/20      Arm Ergometer   Level  1    Watts  7    RPM  30    Minutes  17    METs  1.5      T5 Nustep   Level  1    SPM  49    Minutes  22    METs  1.8      Prescription Details   Frequency (times per week)  2    Duration  Progress to 30 minutes of continuous aerobic without signs/symptoms of physical distress      Intensity   THRR 40-80% of Max Heartrate  97-133-150    Ratings of Perceived Exertion  11-13    Perceived Dyspnea  0-4      Progression   Progression  Continue to progress workloads to maintain intensity without signs/symptoms of physical distress.      Resistance Training   Training Prescription  Yes     Weight  1    Reps  10-15       Perform Capillary Blood Glucose checks as needed.  Exercise Prescription Changes:  Exercise Prescription Changes    Row Name 12/16/19 1400 01/12/20 1800           Response to Exercise   Blood Pressure (Admit)  168/98  140/74      Blood Pressure (Exercise)  160/100  158/92      Blood Pressure (Exit)  150/92  120/86      Heart Rate (Admit)  104 bpm  88 bpm      Heart Rate (Exercise)  102 bpm  102 bpm      Heart Rate (Exit)  96 bpm  99 bpm      Oxygen Saturation (Admit)  96 %  98 %      Oxygen Saturation (Exercise)  96 %  93 %      Oxygen Saturation (Exit)  96 %  94 %      Rating of Perceived Exertion (Exercise)  12  12      Perceived Dyspnea (Exercise)  12  12  Symptoms  extreme SOB  extreme SOB      Duration  Continue with 30 min of aerobic exercise without signs/symptoms of physical distress.  Continue with 30 min of aerobic exercise without signs/symptoms of physical distress.      Intensity  THRR unchanged  THRR unchanged        Progression   Progression  Continue to progress workloads to maintain intensity without signs/symptoms of physical distress.  Continue to progress workloads to maintain intensity without signs/symptoms of physical distress.      Average METs  1.8  1.95        Resistance Training   Training Prescription  Yes  Yes      Weight  2  3      Reps  10-15  10-15      Time  10 Minutes  10 Minutes        Arm Ergometer   Level  1.2  1.7      Watts  13  14      RPM  51  55      Minutes  17  17      METs  1.8  1.9        T5 Nustep   Level  1  2      SPM  88  100      Minutes  22  22      METs  1.8  2        Home Exercise Plan   Plans to continue exercise at  Home (comment)  Home (comment)      Frequency  Add 3 additional days to program exercise sessions.  Add 3 additional days to program exercise sessions.      Initial Home Exercises Provided  11/16/19  11/16/19         Exercise Comments:  Exercise  Comments    Row Name 11/16/19 1109 11/24/19 1503 12/16/19 1437 01/12/20 1810     Exercise Comments  Today was patients inital assessment/orientation. She did well considering her residual effects of SOB and Wheezing post COVID-19. She is eager and wants to attend so that she can improve and decrease her SOB.  Will continue to progress as tolerated on exercise machines and weights.  Patient has completed 9 sessions. She is still dealing with the residuals of having COVID-19. She has a very difficult time breathing. Inspite of this she still comes consistently and tries very hard on each machine. Will continue to progress her workloads and weights as tolerated.  Gracen has been consistent with her attendance. She tries very hard with her exercise and progressions in spite of her difficulty breathing. She says that coming to [facility] is making her stronger. We will continue to progress as tolerated.       Exercise Goals and Review:  Exercise Goals    Row Name 11/16/19 1108             Exercise Goals   Increase Physical Activity  Yes       Intervention  Provide advice, education, support and counseling about physical activity/exercise needs.;Develop an individualized exercise prescription for aerobic and resistive training based on initial evaluation findings, risk stratification, comorbidities and participant's personal goals.       Expected Outcomes  Short Term: Attend rehab on a regular basis to increase amount of physical activity.;Long Term: Add in home exercise to make exercise part of routine and to increase amount of physical activity.  Increase Strength and Stamina  Yes       Intervention  Provide advice, education, support and counseling about physical activity/exercise needs.;Develop an individualized exercise prescription for aerobic and resistive training based on initial evaluation findings, risk stratification, comorbidities and participant's personal goals.       Expected Outcomes   Short Term: Perform resistance training exercises routinely during rehab and add in resistance training at home;Long Term: Improve cardiorespiratory fitness, muscular endurance and strength as measured by increased METs and functional capacity ( )       Able to understand and use rate of perceived exertion (RPE) scale  Yes       Intervention  Provide education and explanation on how to use RPE scale       Expected Outcomes  Short Term: Able to use RPE daily in rehab to express subjective intensity level;Long Term:  Able to use RPE to guide intensity level when exercising independently       Able to understand and use Dyspnea scale  Yes       Intervention  Provide education and explanation on how to use Dyspnea scale       Expected Outcomes  Short Term: Able to use Dyspnea scale daily in rehab to express subjective sense of shortness of breath during exertion;Long Term: Able to use Dyspnea scale to guide intensity level when exercising independently       Knowledge and understanding of Target Heart Rate Range (THRR)  Yes       Intervention  Provide education and explanation of THRR including how the numbers were predicted and where they are located for reference       Expected Outcomes  Short Term: Able to use daily as guideline for intensity in rehab;Long Term: Able to use THRR to govern intensity when exercising independently       Able to check pulse independently  Yes       Intervention  Provide education and demonstration on how to check pulse in carotid and radial arteries.;Review the importance of being able to check your own pulse for safety during independent exercise       Expected Outcomes  Short Term: Able to explain why pulse checking is important during independent exercise;Long Term: Able to check pulse independently and accurately       Understanding of Exercise Prescription  Yes       Intervention  Provide education, explanation, and written materials on patient's individual exercise  prescription       Expected Outcomes  Short Term: Able to explain program exercise prescription;Long Term: Able to explain home exercise prescription to exercise independently          Exercise Goals Re-Evaluation : Exercise Goals Re-Evaluation    Row Name 11/24/19 1501 12/16/19 1436 01/12/20 1809         Exercise Goal Re-Evaluation   Exercise Goals Review  Increase Physical Activity;Increase Strength and Stamina  Increase Physical Activity;Increase Strength and Stamina  Increase Physical Activity;Increase Strength and Stamina     Comments  Patient has just started the program. This is her 3rd visit. We will continue to monitor her progress.  Patient goals are to breathe better and to get back to normal ADL's.  Her goals are to breathe better, get back to her daily life which includes going back to work.     Expected Outcomes  Reach her expected goals of breathing better and getting back to normal ADL's. she also wants to get back to work.  Reach her personal goals  Reach her goals        Discharge Exercise Prescription (Final Exercise Prescription Changes): Exercise Prescription Changes - 01/12/20 1800      Response to Exercise   Blood Pressure (Admit)  140/74    Blood Pressure (Exercise)  158/92    Blood Pressure (Exit)  120/86    Heart Rate (Admit)  88 bpm    Heart Rate (Exercise)  102 bpm    Heart Rate (Exit)  99 bpm    Oxygen Saturation (Admit)  98 %    Oxygen Saturation (Exercise)  93 %    Oxygen Saturation (Exit)  94 %    Rating of Perceived Exertion (Exercise)  12    Perceived Dyspnea (Exercise)  12    Symptoms  extreme SOB    Duration  Continue with 30 min of aerobic exercise without signs/symptoms of physical distress.    Intensity  THRR unchanged      Progression   Progression  Continue to progress workloads to maintain intensity without signs/symptoms of physical distress.    Average METs  1.95      Resistance Training   Training Prescription  Yes    Weight  3     Reps  10-15    Time  10 Minutes      Arm Ergometer   Level  1.7    Watts  14    RPM  55    Minutes  17    METs  1.9      T5 Nustep   Level  2    SPM  100    Minutes  22    METs  2      Home Exercise Plan   Plans to continue exercise at  Home (comment)    Frequency  Add 3 additional days to program exercise sessions.    Initial Home Exercises Provided  11/16/19       Nutrition:  Target Goals: Understanding of nutrition guidelines, daily intake of sodium 1500mg , cholesterol 200mg , calories 30% from fat and 7% or less from saturated fats, daily to have 5 or more servings of fruits and vegetables.  Biometrics: Pre Biometrics - 11/16/19 1111      Pre Biometrics   Height  5\' 2"  (1.575 m)    Weight  101.8 kg    Waist Circumference  44 inches    Hip Circumference  45.5 inches    Waist to Hip Ratio  0.97 %    BMI (Calculated)  41.05    Triceps Skinfold  26 mm    % Body Fat  48.4 %    Grip Strength  20.8 kg    Flexibility  7.6 in    Single Leg Stand  25 seconds        Nutrition Therapy Plan and Nutrition Goals: Nutrition Therapy & Goals - 01/13/20 1413      Personal Nutrition Goals   Comments  We continue to work with patient and RD to schedule RD classes. Her medificts diet assessment score was 33. She continues to say she is trying to eat healthy. Will continue to monitor for progress.      Intervention Plan   Intervention  Nutrition handout(s) given to patient.       Nutrition Assessments: Nutrition Assessments - 11/16/19 1124      MEDFICTS Scores   Pre Score  33       Nutrition Goals Re-Evaluation:   Nutrition Goals Discharge (Final  Nutrition Goals Re-Evaluation):   Psychosocial: Target Goals: Acknowledge presence or absence of significant depression and/or stress, maximize coping skills, provide positive support system. Participant is able to verbalize types and ability to use techniques and skills needed for reducing stress and  depression.  Initial Review & Psychosocial Screening: Initial Psych Review & Screening - 11/16/19 1120      Initial Review   Current issues with  Current Anxiety/Panic   Due to not being able to breath post COVID-19     Family Dynamics   Good Support System?  Yes      Barriers   Psychosocial barriers to participate in program  There are no identifiable barriers or psychosocial needs.      Screening Interventions   Interventions  Encouraged to exercise       Quality of Life Scores: Quality of Life - 11/16/19 1121      Quality of Life   Select  Quality of Life      Quality of Life Scores   Health/Function Pre  20.88 %    Socioeconomic Pre  20.25 %    Psych/Spiritual Pre  20.86 %    Family Pre  20.8 %    GLOBAL Pre  20.72 %      Scores of 19 and below usually indicate a poorer quality of life in these areas.  A difference of  2-3 points is a clinically meaningful difference.  A difference of 2-3 points in the total score of the Quality of Life Index has been associated with significant improvement in overall quality of life, self-image, physical symptoms, and general health in studies assessing change in quality of life.   PHQ-9: Recent Review Flowsheet Data    Depression screen [facility] 2/9 11/16/2019   Decreased Interest 0   Down, Depressed, Hopeless 0   PHQ - 2 Score 0   Altered sleeping 1   Tired, decreased energy 2   Change in appetite 1   Feeling bad or failure about yourself  0   Trouble concentrating 0   Moving slowly or fidgety/restless 0   Suicidal thoughts 0   PHQ-9 Score 4   Difficult doing work/chores Somewhat difficult     Interpretation of Total Score  Total Score Depression Severity:  1-4 = Minimal depression, 5-9 = Mild depression, 10-14 = Moderate depression, 15-19 = Moderately severe depression, 20-27 = Severe depression   Psychosocial Evaluation and Intervention: Psychosocial Evaluation - 11/16/19 1121      Psychosocial Evaluation &  Interventions   Interventions  Encouraged to exercise with the program and follow exercise prescription    Continue Psychosocial Services   No Follow up required       Psychosocial Re-Evaluation: Psychosocial Re-Evaluation    Row Name 11/24/19 1541 12/15/19 1529 01/13/20 1416         Psychosocial Re-Evaluation   Current issues with  Current Anxiety/Panic  Current Anxiety/Panic  Current Anxiety/Panic     Comments  Patient's initial QOL score was 20.72 and her PHQ-9 score was 4. She does report anxiety related to her SOB. She is currently not being treated for her anxiety. Will continue to monitor.  Patient's initial QOL score was 20.72 and her PHQ-9 score was 4. She does report anxiety related to her SOB. She is currently not being treated for her anxiety. Will continue to monitor.  Patient's initial QOL score was 20.72 and her PHQ-9 score was 4. She does report anxiety related to her SOB. She is currently not  being treated for her anxiety. Will continue to monitor.     Expected Outcomes  Patient will have no additional psychosocial issues identified at discharge.  Patient will have no additional psychosocial issues identified at discharge.  Patient will have no additional psychosocial issues identified at discharge.     Interventions  Relaxation education;Encouraged to attend Pulmonary Rehabilitation for the exercise;Stress management education  Relaxation education;Encouraged to attend Pulmonary Rehabilitation for the exercise;Stress management education  Relaxation education;Encouraged to attend Pulmonary Rehabilitation for the exercise;Stress management education     Continue Psychosocial Services   Follow up required by staff  Follow up required by staff  No Follow up required        Psychosocial Discharge (Final Psychosocial Re-Evaluation): Psychosocial Re-Evaluation - 01/13/20 1416      Psychosocial Re-Evaluation   Current issues with  Current Anxiety/Panic    Comments  Patient's  initial QOL score was 20.72 and her PHQ-9 score was 4. She does report anxiety related to her SOB. She is currently not being treated for her anxiety. Will continue to monitor.    Expected Outcomes  Patient will have no additional psychosocial issues identified at discharge.    Interventions  Relaxation education;Encouraged to attend Pulmonary Rehabilitation for the exercise;Stress management education    Continue Psychosocial Services   No Follow up required        Education: Education Goals: Education classes will be provided on a weekly basis, covering required topics. Participant will state understanding/return demonstration of topics presented.  Learning Barriers/Preferences: Learning Barriers/Preferences - 11/16/19 1004      Learning Barriers/Preferences   Learning Barriers  None    Learning Preferences  Pictoral;Group Instruction;Video;Individual Instruction;Skilled Demonstration       Education Topics: How Lungs Work and Diseases: - Discuss the anatomy of the lungs and diseases that can affect the lungs, such as COPD.   PULMONARY REHAB OTHER RESPIRATORY from 01/12/2020 in [facility]  Date  12/29/19  Educator  D. Coad  Instruction Review Code  2- Demonstrated Understanding      Exercise: -Discuss the importance of exercise, FITT principles of exercise, normal and abnormal responses to exercise, and how to exercise safely.   Environmental Irritants: -Discuss types of environmental irritants and how to limit exposure to environmental irritants.   Meds/Inhalers and oxygen: - Discuss respiratory medications, definition of an inhaler and oxygen, and the proper way to use an inhaler and oxygen.   PULMONARY REHAB OTHER RESPIRATORY from 01/12/2020 in [facility]  Date  01/12/20  Educator  DC      Energy Saving Techniques: - Discuss methods to conserve energy and decrease shortness of breath when performing activities of daily  living.    Bronchial Hygiene / Breathing Techniques: - Discuss breathing mechanics, pursed-lip breathing technique,  proper posture, effective ways to clear airways, and other functional breathing techniques   Cleaning Equipment: - Provides group verbal and written instruction about the health risks of elevated stress, cause of high stress, and healthy ways to reduce stress.   Nutrition I: Fats: - Discuss the types of cholesterol, what cholesterol does to the body, and how cholesterol levels can be controlled.   Nutrition II: Labels: -Discuss the different components of food labels and how to read food labels.   Respiratory Infections: - Discuss the signs and symptoms of respiratory infections, ways to prevent respiratory infections, and the importance of seeking medical treatment when having a respiratory infection.   PULMONARY REHAB OTHER RESPIRATORY from  01/12/2020 in [facility]  Date  11/24/19  Educator  DLaural Benes  Instruction Review Code  2- Demonstrated Understanding      Stress I: Signs and Symptoms: - Discuss the causes of stress, how stress may lead to anxiety and depression, and ways to limit stress.   PULMONARY REHAB OTHER RESPIRATORY from 01/12/2020 in [facility]  Date  12/01/19  Educator  DC  Instruction Review Code  2- Demonstrated Understanding      Stress II: Relaxation: -Discuss relaxation techniques to limit stress.   PULMONARY REHAB OTHER RESPIRATORY from 01/12/2020 in [facility]  Date  12/08/19  Educator  Timoteo Expose  Instruction Review Code  2- Demonstrated Understanding      Oxygen for Home/Travel: - Discuss how to prepare for travel when on oxygen and proper ways to transport and store oxygen to ensure safety.   PULMONARY REHAB OTHER RESPIRATORY from 01/12/2020 in [facility]  Date  12/15/19  Educator  Timoteo Expose  Instruction Review Code  2- Demonstrated  Understanding      Knowledge Questionnaire Score: Knowledge Questionnaire Score - 11/16/19 1005      Knowledge Questionnaire Score   Pre Score  15/18       Core Components/Risk Factors/Patient Goals at Admission: Personal Goals and Risk Factors at Admission - 11/16/19 1125      Core Components/Risk Factors/Patient Goals on Admission    Weight Management  Weight Maintenance    Personal Goal Other  Yes    Personal Goal  Breath better, Be able to get back to normal ADL's, Get back to her daily life and to be able to return to work.    Intervention  Attend program 2 x week and to supplement with 3 x week home exercise plan.    Expected Outcomes  Reach expected goals.       Core Components/Risk Factors/Patient Goals Review:  Goals and Risk Factor Review    Row Name 11/24/19 1539 12/15/19 1526 01/13/20 1413         Core Components/Risk Factors/Patient Goals Review   Personal Goals Review  Weight Management/Obesity Get back to ADL's; and get back to her daily life. Breathe better.  Weight Management/Obesity Get bacl to ADL's; Get back to her daily life; breathe better.  Weight Management/Obesity Get back to ADL's; Get back to her daily life; breathe better.     Review  Patient is new to the program. She has completed 3 sessions. She does say however that she is doing more at home since she has been attending the program because she is not as afraid to do more. Will continue to monitor for progress.  Patient has completed 9 sessions gaining 3 llbs since she started the program. She is doing well in the program with some progression. She says she has good days and bad days with her breathing. She reports recently being exposed to yard mowing which has exacerbated her SOB and wheezing. She had to use her MDI during the session today. She still however feels like she is making progress in the program having more "good" days and attempting to do more at home. Will continue to monitor for  progress.  Patient has completed 16 sessions gaining 4 lbs since last 30 day review. She continues to do well in the program with progression. She reports doing more at home on the days she feels stronger. She feels like the program has given her more strength.  Her SOB has improved. She is able to walk from her car in the parking lot into the building to the gym now without having to stop and rest of getting extremely SOB. Will continue to monitor for progress.     Expected Outcomes  Patient will continue to attend sessions and complete the program meeting her personal goals.  Patient will continue to attend sessions and complete the program meeting her personal goals.  Patient will continue to attend sessions and complete the program meeting both personal and program goals.        Core Components/Risk Factors/Patient Goals at Discharge (Final Review):  Goals and Risk Factor Review - 01/13/20 1413      Core Components/Risk Factors/Patient Goals Review   Personal Goals Review  Weight Management/Obesity   Get back to ADL's; Get back to her daily life; breathe better.   Review  Patient has completed 16 sessions gaining 4 lbs since last 30 day review. She continues to do well in the program with progression. She reports doing more at home on the days she feels stronger. She feels like the program has given her more strength. Her SOB has improved. She is able to walk from her car in the parking lot into the building to the gym now without having to stop and rest of getting extremely SOB. Will continue to monitor for progress.    Expected Outcomes  Patient will continue to attend sessions and complete the program meeting both personal and program goals.       ITP Comments: ITP Comments    Row Name 11/16/19 1027           ITP Comments  Patient is coming to use post COVID-19 with pneumonia which has left her extremely SOB on exertion. Patient is eager to get started.          Comments: ITP  REVIEW Pt is making expected progress toward pulmonary rehab goals after completing 16 sessions. Recommend continued exercise, life style modification, education, and utilization of breathing techniques to increase stamina and strength and decrease shortness of breath with exertion.

## 2020-01-17 ENCOUNTER — Other Ambulatory Visit: Payer: Self-pay

## 2020-01-17 ENCOUNTER — Encounter (HOSPITAL_COMMUNITY)
Admission: RE | Admit: 2020-01-17 | Discharge: 2020-01-17 | Disposition: A | Discharge status: Home / Self Care | Payer: No Typology Code available for payment source | Source: Ambulatory Visit | Attending: Pulmonary Disease | Admitting: Pulmonary Disease

## 2020-01-17 DIAGNOSIS — R0602 Shortness of breath: Secondary | ICD-10-CM | POA: Diagnosis not present

## 2020-01-17 NOTE — Progress Notes (Signed)
Daily Session Note  Patient Details  Name: Jane Bennett MRN: [id number] Date of Birth: 1966/03/18 Referring Provider:     PULMONARY REHAB OTHER RESP ORIENTATION from 11/16/2019 in [place]  Referring Provider  Dr. Loanne Drilling      Encounter Date: 01/17/2020  Check In: Session Check In - 01/17/20 1045      Check-In   Supervising physician immediately available to respond to emergencies  See telemetry face sheet for immediately available MD    Location  [facility]    Staff Present  [name], MS, EP, [facility], Exercise Physiologist;Other    Virtual Visit  No    Medication changes reported      No    Fall or balance concerns reported     No    Tobacco Cessation  No Change    Warm-up and Cool-down  Performed as group-led instruction    Resistance Training Performed  Yes    VAD Patient?  No    PAD/SET Patient?  No      Pain Assessment   Currently in Pain?  No/denies    Pain Score  0-No pain    Multiple Pain Sites  No       Capillary Blood Glucose: No results found for this or any previous visit (from the past 24 hour(s)).    Social History   Tobacco Use  Smoking Status Never Smoker  Smokeless Tobacco Never Used    Goals Met:  Proper associated with RPD/PD & O2 Sat Independence with exercise equipment Improved SOB with ADL's Using PLB without cueing & demonstrates good technique Exercise tolerated well Personal goals reviewed No report of cardiac concerns or symptoms Strength training completed today  Goals Unmet:  Not Applicable  Comments: check out 11:45   Dr. Kate Sable is Medical Director for [facility] and Pulmonary Rehab.

## 2020-01-19 ENCOUNTER — Encounter (HOSPITAL_COMMUNITY)
Admission: RE | Admit: 2020-01-19 | Discharge: 2020-01-19 | Disposition: A | Discharge status: Home / Self Care | Payer: No Typology Code available for payment source | Source: Ambulatory Visit | Attending: Pulmonary Disease | Admitting: Pulmonary Disease

## 2020-01-19 ENCOUNTER — Other Ambulatory Visit: Payer: Self-pay

## 2020-01-19 DIAGNOSIS — R0602 Shortness of breath: Secondary | ICD-10-CM | POA: Diagnosis not present

## 2020-01-20 ENCOUNTER — Ambulatory Visit (INDEPENDENT_AMBULATORY_CARE_PROVIDER_SITE_OTHER): Payer: No Typology Code available for payment source | Admitting: Primary Care

## 2020-01-20 DIAGNOSIS — J383 Other diseases of vocal cords: Secondary | ICD-10-CM | POA: Diagnosis not present

## 2020-01-20 DIAGNOSIS — Z8616 Personal history of COVID-19: Secondary | ICD-10-CM

## 2020-01-20 DIAGNOSIS — J4542 Moderate persistent asthma with status asthmaticus: Secondary | ICD-10-CM | POA: Diagnosis not present

## 2020-01-20 MED ORDER — FAMOTIDINE 20 MG PO TABS: 20.0000 mg | ORAL_TABLET | Freq: Every day | ORAL | 1 refills | Status: DC

## 2020-01-20 NOTE — Progress Notes (Signed)
Virtual Visit via Telephone Note  I connected with Jane Bennett on 01/20/20 at  9:30 AM EDT by telephone and verified that I am speaking with the correct person using two identifiers.  Location: Patient: Home Provider: Office   I discussed the limitations, risks, security and privacy concerns of performing an evaluation and management service by telephone and the availability of in person appointments. I also discussed with the patient that there may be a patient responsible charge related to this service. The patient expressed understanding and agreed to proceed.   History of Present Illness:  Patient is a 54 yo female, never smoker. PMH significant for hx of covid 19 pneumonia. Patient of Dr. Everardo All and last seen on 11/02/2019 for wheezing. These symptoms were persistent after the patient had covid-19 pneumonia from 09/13/2019.  Patient reports associated SOB with exertion. Possible DVT in January of 2021, started her on Xarelto prophylactically. Patient was started on Advair 230 mcg 2 puffs BID. Referred to pulmonary rehab and ENT for upper airway wheezing. Ordered for PFTs and DVT ultrasound.  01/06/2020 Patient here for follow up office visit and PFT. She has seen some minor improvement but is still having persistent audible upper airway wheezing and shortness of breath with any exertion. Heat exacerbates her dyspnea. She will wake up at least once a night with shortness of breath having to use rescue inhaler. She sleeps with 3 pillows. Patient was using albuterol 6 times a day and now only uses albuterol 2-3 times a day since starting advair.   She does complain of some allergy symptoms today such as watery eyes, post-nasal drip. No sneezing or congestion. Interested in possibly trying an allergy medication.  Denies any GERD symptoms. No heart burn, belching, acid indigestion.   She saw ENT on 11/30/2019 at [facility] with Dr. Jenne Pane. 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 He referred her to speech pathology. Patient states case worker has not set up appointment with speech pathology and it's been over a month.   Pulmonary rehab started in November 16, 2019. Doing exercises to strenthen chest muscles to help with deep breathing. Still gets sob with exertion and hasn't been able to do treadmill exercises yet.   At the end of the visit, workers comp Sports coach brought in at end of visit which was approved by patient to be filled in on HPI.  01/20/2020 Patient contacted today for 2 week follow-up. She has noticed some improvement in wheezing with prednisone course and trial Spiriva. Heat continues to make her symptoms worse. Reports that phelgm gets caught in the back of her throat. States that she wakes up at night with cough. Noticed some associated belching/GERD symptoms when asked. She is still going to pulmonary rehab. She has not heard from speech pathology.   Observations/Objective:  - Moderate upper airway wheeze/stridor, no change from last visit  - Able to speak in 5-7 word sentences before experiencing dyspnea    PFT results 01/06/2020:  Results indicate moderate obstruction with reversibility indicative of moderate asthma. Pre-FVC 1.20 (37%) Pre-FEV1 0.84 (33%); post-FEV1 1.06 (41%) +25 Pre-Fev1/FVC 70 (88%); post-Fev1/FVC 84 (105%) +19 TLC 3.97 (83%) DLCO 20.20 (104%)  Imaging: 11/04/2019 (DVT ultrasound right lower extremity): No evidence of acute or chronic DVT within the right lower extremity. 10/20/2019: CXR lungs are normal; no pleural effusion.  Assessment and Plan:  Vocal cord dysfunction Moderate to severe upper airway wheeze/stridor  Add famotidine 20mg  at betime d/t reflux/cough Recommend Robitussin 5-10ml (100-200mg ) every 4 hours for cough  Follow-up with ENT, it was recommended she attend speech pathology- she has not been contacted to set this up     Asthma Obstructive asthma FEV1 1.06 (41%), ratio 84 Continue Advair hfa 230-54mcg two puffs twice daily Continue Spiriva 1.25mg  two puffs once daily    Follow Up Instructions:  4 weeks follow-up    I discussed the assessment and treatment plan with the patient. The patient was provided an opportunity to ask questions and all were answered. The patient agreed with the plan and demonstrated an understanding of the instructions.   The patient was advised to call back or seek an in-person evaluation if the symptoms worsen or if the condition fails to improve as anticipated.  I provided 18 minutes of non-face-to-face time during this encounter.   Martyn Ehrich, NP

## 2020-01-20 NOTE — Patient Instructions (Addendum)
Recommendations: Continue Advair two puffs twice daily  Continue Spiriva two puffs once daily  Take Robitussin 5-10ml every 4 hours for cough   Rx: Famotidine 20mg  at bedtime   Follow-up: 4 weeks with Dr. or APP    Food Choices for Gastroesophageal Reflux Disease, Adult When you have gastroesophageal reflux disease (GERD), the foods you eat and your eating habits are very important. Choosing the right foods can help ease your discomfort. Think about working with a nutrition specialist (dietitian) to help you make good choices. What are tips for following this plan?  Meals  Choose healthy foods that are low in fat, such as fruits, vegetables, whole grains, low-fat dairy products, and lean meat, fish, and poultry.  Eat small meals often instead of 3 large meals a day. Eat your meals slowly, and in a place where you are relaxed. Avoid bending over or lying down until 2-3 hours after eating.  Avoid eating meals 2-3 hours before bed.  Avoid drinking a lot of liquid with meals.  Cook foods using methods other than frying. Bake, grill, or broil food instead.  Avoid or limit: ? Chocolate. ? Peppermint or spearmint. ? Alcohol. ? Pepper. ? Black and decaffeinated coffee. ? Black and decaffeinated tea. ? Bubbly (carbonated) soft drinks. ? Caffeinated energy drinks and soft drinks.  Limit high-fat foods such as: ? Fatty meat or fried foods. ? Whole milk, cream, butter, or ice cream. ? Nuts and nut butters. ? Pastries, donuts, and sweets made with butter or shortening.  Avoid foods that cause symptoms. These foods may be different for everyone. Common foods that cause symptoms include: ? Tomatoes. ? Oranges, lemons, and limes. ? Peppers. ? Spicy food. ? Onions and garlic. ? Vinegar. Lifestyle  Maintain a healthy weight. Ask your doctor what weight is healthy for you. If you need to lose weight, work with your doctor to do so safely.  Exercise for at least 30 minutes  for 5 or more days each week, or as told by your doctor.  Wear loose-fitting clothes.  Do not smoke. If you need help quitting, ask your doctor.  Sleep with the head of your bed higher than your feet. Use a wedge under the mattress or blocks under the bed frame to raise the head of the bed. Summary  When you have gastroesophageal reflux disease (GERD), food and lifestyle choices are very important in easing your symptoms.  Eat small meals often instead of 3 large meals a day. Eat your meals slowly, and in a place where you are relaxed.  Limit high-fat foods such as fatty meat or fried foods.  Avoid bending over or lying down until 2-3 hours after eating.  Avoid peppermint and spearmint, caffeine, alcohol, and chocolate. This information is not intended to replace advice given to you by your health care provider. Make sure you discuss any questions you have with your health care provider. Document Revised: 12/30/2018 Document Reviewed: 10/14/2016 Elsevier Patient Education  2020 10/16/2016.

## 2020-01-22 ENCOUNTER — Other Ambulatory Visit: Payer: Self-pay

## 2020-01-23 MED ORDER — SPIRIVA RESPIMAT 1.25 MCG/ACT IN AERS: 2.0000 | INHALATION_SPRAY | Freq: Every day | RESPIRATORY_TRACT | 5 refills | Status: DC

## 2020-01-24 ENCOUNTER — Telehealth: Payer: Self-pay | Admitting: Primary Care

## 2020-01-24 ENCOUNTER — Other Ambulatory Visit: Payer: Self-pay

## 2020-01-24 ENCOUNTER — Encounter (HOSPITAL_COMMUNITY)
Admission: RE | Admit: 2020-01-24 | Discharge: 2020-01-24 | Disposition: A | Discharge status: Home / Self Care | Payer: No Typology Code available for payment source | Source: Ambulatory Visit | Attending: Pulmonary Disease | Admitting: Pulmonary Disease

## 2020-01-24 DIAGNOSIS — J410 Simple chronic bronchitis: Secondary | ICD-10-CM | POA: Diagnosis present

## 2020-01-24 DIAGNOSIS — R0602 Shortness of breath: Secondary | ICD-10-CM | POA: Insufficient documentation

## 2020-01-24 NOTE — Progress Notes (Signed)
Daily Session Note  Patient Details  Name: Jane Bennett MRN: [id number] Date of Birth: Dec 01, 1965 Referring Provider:     PULMONARY REHAB OTHER RESP ORIENTATION from 11/16/2019 in [place]  Referring Provider  Dr. Loanne Drilling      Encounter Date: 01/24/2020  Check In: Session Check In - 01/24/20 1045      Check-In   Supervising physician immediately available to respond to emergencies  See telemetry face sheet for immediately available MD    Location  [facility]    Staff Present  Jane Dar, MS, EP, Jane Bennett, Exercise Physiologist;Other;Jane Jarosz [name], RN, BSN    Virtual Visit  No    Medication changes reported      No    Fall or balance concerns reported     No    Tobacco Cessation  No Change    Warm-up and Cool-down  Performed as group-led instruction    Resistance Training Performed  Yes    VAD Patient?  No    PAD/SET Patient?  No      Pain Assessment   Currently in Pain?  No/denies    Pain Score  0-No pain    Multiple Pain Sites  No       Capillary Blood Glucose: No results found for this or any previous visit (from the past 24 hour(s)).    Social History   Tobacco Use  Smoking Status Never Smoker  Smokeless Tobacco Never Used    Goals Met:  Proper associated with RPD/PD & O2 Sat Independence with exercise equipment Improved SOB with ADL's Using PLB without cueing & demonstrates good technique Exercise tolerated well No report of cardiac concerns or symptoms Strength training completed today  Goals Unmet:  Not Applicable  Comments: Check out 1145.   Dr. Kate Sable is Medical Director for [facility] Cardiac and Pulmonary Rehab.

## 2020-01-24 NOTE — Telephone Encounter (Signed)
Pt's OV notes have been faxed to provided fax number by Aurther Loft. Nothing further needed.

## 2020-01-26 ENCOUNTER — Other Ambulatory Visit: Payer: Self-pay

## 2020-01-26 ENCOUNTER — Encounter (HOSPITAL_COMMUNITY)
Admission: RE | Admit: 2020-01-26 | Discharge: 2020-01-26 | Disposition: A | Discharge status: Home / Self Care | Payer: No Typology Code available for payment source | Source: Ambulatory Visit | Attending: Pulmonary Disease | Admitting: Pulmonary Disease

## 2020-01-26 DIAGNOSIS — R0602 Shortness of breath: Secondary | ICD-10-CM | POA: Diagnosis not present

## 2020-01-26 NOTE — Progress Notes (Signed)
Daily Session Note  Patient Details  Name: Jane Bennett MRN: [id number] Date of Birth: 01/04/1966 Referring Provider:     PULMONARY REHAB OTHER RESP ORIENTATION from 11/16/2019 in [place]  Referring Provider  Dr. Loanne Drilling      Encounter Date: 01/26/2020  Check In: Session Check In - 01/26/20 1045      Check-In   Supervising physician immediately available to respond to emergencies  See telemetry face sheet for immediately available MD    Location  [facility]    Staff Present  [name], MS, EP, [facility], Exercise Physiologist;Other    Virtual Visit  No    Medication changes reported      No    Fall or balance concerns reported     No    Tobacco Cessation  No Change    Warm-up and Cool-down  Performed as group-led instruction    Resistance Training Performed  Yes    VAD Patient?  No    PAD/SET Patient?  No      Pain Assessment   Currently in Pain?  No/denies    Pain Score  0-No pain    Multiple Pain Sites  No       Capillary Blood Glucose: No results found for this or any previous visit (from the past 24 hour(s)).    Social History   Tobacco Use  Smoking Status Never Smoker  Smokeless Tobacco Never Used    Goals Met:  Proper associated with RPD/PD & O2 Sat Independence with exercise equipment Improved SOB with ADL's Using PLB without cueing & demonstrates good technique Exercise tolerated well Personal goals reviewed No report of cardiac concerns or symptoms Strength training completed today  Goals Unmet:  Not Applicable  Comments: check out 11:45   Dr. Kate Sable is Medical Director for [facility] and Pulmonary Rehab.

## 2020-01-31 ENCOUNTER — Encounter (HOSPITAL_COMMUNITY)
Admission: RE | Admit: 2020-01-31 | Discharge: 2020-01-31 | Disposition: A | Discharge status: Home / Self Care | Payer: No Typology Code available for payment source | Source: Ambulatory Visit | Attending: Pulmonary Disease | Admitting: Pulmonary Disease

## 2020-01-31 ENCOUNTER — Other Ambulatory Visit: Payer: Self-pay

## 2020-01-31 DIAGNOSIS — R0602 Shortness of breath: Secondary | ICD-10-CM | POA: Diagnosis not present

## 2020-01-31 NOTE — Progress Notes (Signed)
Daily Session Note  Patient Details  Name: Jane Bennett MRN: [id number] Date of Birth: 1966/08/02 Referring Provider:     PULMONARY REHAB OTHER RESP ORIENTATION from 11/16/2019 in [place]  Referring Provider  Dr. Loanne Drilling      Encounter Date: 01/31/2020  Check In: Session Check In - 01/31/20 1045      Check-In   Supervising physician immediately available to respond to emergencies  See telemetry face sheet for immediately available MD    Location  [facility]    Staff Present  [name], MS, EP, [facility], Exercise Physiologist;Other    Virtual Visit  No    Medication changes reported      No    Fall or balance concerns reported     No    Tobacco Cessation  No Change    Warm-up and Cool-down  Performed as group-led instruction    Resistance Training Performed  Yes    VAD Patient?  No    PAD/SET Patient?  No      Pain Assessment   Currently in Pain?  No/denies    Pain Score  0-No pain    Multiple Pain Sites  No       Capillary Blood Glucose: No results found for this or any previous visit (from the past 24 hour(s)).    Social History   Tobacco Use  Smoking Status Never Smoker  Smokeless Tobacco Never Used    Goals Met:  Proper associated with RPD/PD & O2 Sat Independence with exercise equipment Improved SOB with ADL's Using PLB without cueing & demonstrates good technique Exercise tolerated well Personal goals reviewed No report of cardiac concerns or symptoms Strength training completed today  Goals Unmet:  Not Applicable  Comments: check out 11:45   Dr. Kate Sable is Medical Director for [place] and Pulmonary Rehab.

## 2020-02-02 ENCOUNTER — Encounter (HOSPITAL_COMMUNITY)
Admission: RE | Admit: 2020-02-02 | Discharge: 2020-02-02 | Disposition: A | Discharge status: Home / Self Care | Payer: No Typology Code available for payment source | Source: Ambulatory Visit | Attending: Pulmonary Disease | Admitting: Pulmonary Disease

## 2020-02-02 ENCOUNTER — Other Ambulatory Visit: Payer: Self-pay

## 2020-02-02 DIAGNOSIS — J410 Simple chronic bronchitis: Secondary | ICD-10-CM

## 2020-02-02 DIAGNOSIS — R0602 Shortness of breath: Secondary | ICD-10-CM | POA: Diagnosis not present

## 2020-02-02 NOTE — Progress Notes (Signed)
Daily Session Note  Patient Details  Name: Jane Bennett MRN: [id number] Date of Birth: 1965-10-29 Referring Provider:     PULMONARY REHAB OTHER RESP ORIENTATION from 11/16/2019 in [place]  Referring Provider  Dr. Loanne Drilling      Encounter Date: 02/02/2020  Check In: Session Check In - 02/02/20 1126      Check-In   Supervising physician immediately available to respond to emergencies  See telemetry face sheet for immediately available MD    Location  [facility]    Staff Present  [name], MS, EP, [facility], Exercise Physiologist;Other    Virtual Visit  No    Medication changes reported      No    Fall or balance concerns reported     No    Tobacco Cessation  No Change    Warm-up and Cool-down  Performed as group-led instruction    Resistance Training Performed  Yes    VAD Patient?  No    PAD/SET Patient?  No      Pain Assessment   Currently in Pain?  No/denies    Pain Score  0-No pain    Multiple Pain Sites  No       Capillary Blood Glucose: No results found for this or any previous visit (from the past 24 hour(s)).    Social History   Tobacco Use  Smoking Status Never Smoker  Smokeless Tobacco Never Used    Goals Met:  Proper associated with RPD/PD & O2 Sat Independence with exercise equipment Improved SOB with ADL's Using PLB without cueing & demonstrates good technique Exercise tolerated well Personal goals reviewed No report of cardiac concerns or symptoms Strength training completed today  Goals Unmet:  Not Applicable  Comments: Check out: 1145   Dr. Kate Sable is Medical Director for [facility].

## 2020-02-03 NOTE — Progress Notes (Signed)
Pulmonary Individual Treatment Plan  Patient Details  Name: Jane Bennett MRN: [id number] Date of Birth: [birth year] Referring Provider:     PULMONARY REHAB OTHER RESP ORIENTATION from 11/16/2019 in [facility] CARDIAC REH[facility]ILITATION  Referring Provider  Dr. Everardo All      Initial Encounter Date:    PULMONARY REHAB OTHER RESP ORIENTATION from 11/16/2019 in [facility]ARDIAC REHABILITATION  Date  11/16/19      Visit Diagnosis: SOB (shortness of breath)  Simple chronic bronchitis (HCC)  Patient's Home Medications on Admission:   Current Outpatient Medications:  .  albuterol (VENTOLIN HFA) 108 (90 Base) MCG/ACT inhaler, INHALE 1 TO 2 PUFFS BY MOUTH EVERY 4 TO 6 HOURS, Disp: 18 g, Rfl: 3 .  Cetirizine HCl 10 MG CAPS, Take 1 capsule (10 mg total) by mouth daily., Disp: 30 capsule, Rfl: 1 .  famotidine (PEPCID) 20 MG tablet, Take 1 tablet (20 mg total) by mouth at bedtime., Disp: 30 tablet, Rfl: 1 .  fluticasone (FLONASE) 50 MCG/ACT nasal spray, Place 1 spray into both nostrils daily., Disp: 16 g, Rfl: 2 .  fluticasone-salmeterol (ADVAIR HFA) 230-21 MCG/ACT inhaler, Inhale 2 puffs into the lungs 2 (two) times daily., Disp: 1 Inhaler, Rfl: 12 .  losartan-hydrochlorothiazide (HYZAAR) 50-12.5 MG tablet, Take 1 tablet by mouth daily., Disp: , Rfl:  .  ondansetron (ZOFRAN-ODT) 8 MG disintegrating tablet, Take 8 mg by mouth every 6 (six) hours as needed., Disp: , Rfl:  .  rivaroxaban (XARELTO) 20 MG TABS tablet, Take 20 mg by mouth daily with supper., Disp: , Rfl:  .  Tiotropium Bromide Monohydrate (SPIRIVA RESPIMAT) 1.25 MCG/ACT AERS, Inhale 2 puffs into the lungs daily., Disp: 4 g, Rfl: 5  Past Medical History: Past Medical History:  Diagnosis Date  . DVT (deep venous thrombosis) (HCC)   . High cholesterol   . Pneumonia     Tobacco Use: Social History   Tobacco Use  Smoking Status Never Smoker  Smokeless Tobacco Never Used    Labs: Recent Review Flowsheet Data    There is no  flowsheet data to display.      Capillary Blood Glucose: No results found for: GLUCAP   Pulmonary Assessment Scores: Pulmonary Assessment Scores    Row Name 11/16/19 1117         ADL UCSD   ADL Phase  Entry     SOB Score total  90     Rest  0     Walk  11     Stairs  5     Bath  4     Dress  5     Shop  5       CAT Score   CAT Score  33       mMRC Score   mMRC Score  4       UCSD: Self-administered rating of dyspnea associated with activities of daily living (ADLs) 6-point scale (0 = "not at all" to 5 = "maximal or unable to do because of breathlessness")  Scoring Scores range from 0 to 120.  Minimally important difference is 5 units  CAT: CAT can identify the health impairment of COPD patients and is better correlated with disease progression.  CAT has a scoring range of zero to 40. The CAT score is classified into four groups of low (less than 10), medium (10 - 20), high (21-30) and very high (31-40) based on the impact level of disease on health status. A CAT score over 10 suggests  significant symptoms.  A worsening CAT score could be explained by an exacerbation, poor medication adherence, poor inhaler technique, or progression of COPD or comorbid conditions.  CAT MCID is 2 points  mMRC: mMRC (Modified Medical Research Council) Dyspnea Scale is used to assess the degree of baseline functional disability in patients of respiratory disease due to dyspnea. No minimal important difference is established. A decrease in score of 1 point or greater is considered a positive change.   Pulmonary Function Assessment:   Exercise Target Goals: Exercise Program Goal: Individual exercise prescription set using results from initial 6 min walk test and THRR while considering  patient's activity barriers and safety.   Exercise Prescription Goal: Initial exercise prescription builds to 30-45 minutes a day of aerobic activity, 2-3 days per week.  Home exercise guidelines will be  given to patient during program as part of exercise prescription that the participant will acknowledge.  Activity Barriers & Risk Stratification: Activity Barriers & Cardiac Risk Stratification - 11/16/19 1103      Activity Barriers & Cardiac Risk Stratification   Activity Barriers  Shortness of Breath    Cardiac Risk Stratification  Moderate       6 Minute Walk: 6 Minute Walk    Row Name 11/16/19 1058         6 Minute Walk   Phase  Initial     Distance  700 feet     Walk Time  6 minutes     # of Rest Breaks  1     MPH  1.32     METS  2.01     RPE  14     Perceived Dyspnea   16     VO2 Peak  0.56     Symptoms  Yes (comment)     Comments  Extreme SOB. Had to stop to rest for 2 minutes then was able to finish the test.     Resting HR  83 bpm     Resting BP  170/98     Resting Oxygen Saturation   97 %     Exercise Oxygen Saturation  during 6 min walk  96 %     Max Ex. HR  107 bpm     Max Ex. BP  198/120     2 Minute Post BP  158/98        Oxygen Initial Assessment: Oxygen Initial Assessment - 11/16/19 1116      Home Oxygen   Home Oxygen Device  None    Sleep Oxygen Prescription  None    Home Exercise Oxygen Prescription  None    Home at Rest Exercise Oxygen Prescription  None      Initial 6 min Walk   Oxygen Used  None      Program Oxygen Prescription   Program Oxygen Prescription  None       Oxygen Re-Evaluation: Oxygen Re-Evaluation    Row Name 11/24/19 1537 12/15/19 1525 01/13/20 1413 02/03/20 1528       Program Oxygen Prescription   Program Oxygen Prescription  None  None  None  None      Home Oxygen   Home Oxygen Device  None  None  None  None    Sleep Oxygen Prescription  None  None  None  None    Home Exercise Oxygen Prescription  None  None  None  None    Home at Rest Exercise Oxygen Prescription  None  None  None  None    Compliance with Home Oxygen Use  Yes  Yes  Yes  Yes      Goals/Expected Outcomes   Short Term Goals  To learn and  exhibit compliance with exercise, home and travel O2 prescription;To learn and understand importance of monitoring SPO2 with pulse oximeter and demonstrate accurate use of the pulse oximeter.;To learn and understand importance of maintaining oxygen saturations>88%;To learn and demonstrate proper pursed lip breathing techniques or other breathing techniques.  To learn and exhibit compliance with exercise, home and travel O2 prescription;To learn and understand importance of monitoring SPO2 with pulse oximeter and demonstrate accurate use of the pulse oximeter.;To learn and understand importance of maintaining oxygen saturations>88%;To learn and demonstrate proper pursed lip breathing techniques or other breathing techniques.  To learn and exhibit compliance with exercise, home and travel O2 prescription;To learn and understand importance of monitoring SPO2 with pulse oximeter and demonstrate accurate use of the pulse oximeter.;To learn and understand importance of maintaining oxygen saturations>88%;To learn and demonstrate proper pursed lip breathing techniques or other breathing techniques.  To learn and exhibit compliance with exercise, home and travel O2 prescription;To learn and understand importance of monitoring SPO2 with pulse oximeter and demonstrate accurate use of the pulse oximeter.;To learn and understand importance of maintaining oxygen saturations>88%;To learn and demonstrate proper pursed lip breathing techniques or other breathing techniques.    Long  Term Goals  Exhibits compliance with exercise, home and travel O2 prescription;Verbalizes importance of monitoring SPO2 with pulse oximeter and return demonstration;Maintenance of O2 saturations>88%;Exhibits proper breathing techniques, such as pursed lip breathing or other method taught during program session;Compliance with respiratory medication  Exhibits compliance with exercise, home and travel O2 prescription;Verbalizes importance of  monitoring SPO2 with pulse oximeter and return demonstration;Maintenance of O2 saturations>88%;Exhibits proper breathing techniques, such as pursed lip breathing or other method taught during program session;Compliance with respiratory medication  Exhibits compliance with exercise, home and travel O2 prescription;Verbalizes importance of monitoring SPO2 with pulse oximeter and return demonstration;Maintenance of O2 saturations>88%;Exhibits proper breathing techniques, such as pursed lip breathing or other method taught during program session;Compliance with respiratory medication  Exhibits compliance with exercise, home and travel O2 prescription;Verbalizes importance of monitoring SPO2 with pulse oximeter and return demonstration;Maintenance of O2 saturations>88%;Exhibits proper breathing techniques, such as pursed lip breathing or other method taught during program session;Compliance with respiratory medication    Comments  Patient is able to verbalize the importance of monitoring her SPO2 and maintaining her O2 saturation >88%. She is also demonstrate proper pursed lip breathing technique in class and is compliant with her exercise prescription and medications.  Patient is able to verbalize the importance of monitoring her SPO2 and maintaining her O2 saturation >88% and to demonstrate proper usage of pulse oximeter. She is also able to demonstrate proper pursed lip breathing technique in class and is compliant with her exercise prescription and medications.  Patient is able to verbalize the importance of monitoring her SPO2 and maintaining her O2 saturation >88% and to demonstrate proper usage of pulse oximeter. She is also able to demonstrate proper pursed lip breathing technique in class and is compliant with her exercise prescription and medications.  Patient is able to verbalize the importance of monitoring her SPO2 and maintaining her O2 saturation >88% and to demonstrate proper usage of pulse oximeter.  She is also able to demonstrate proper pursed lip breathing technique in class and is compliant with her exercise prescription and medications.    Goals/Expected Outcomes  Patient  is meeting her expected goals and outcomes. Will continue to monitor.  Patient is meeting her expected goals and outcomes. Will continue to monitor.  Patient is meeting her expected goals and outcomes. Will continue to monitor.  Patient is meeting her expected goals and outcomes. Will continue to monitor.       Oxygen Discharge (Final Oxygen Re-Evaluation): Oxygen Re-Evaluation - 02/03/20 1528      Program Oxygen Prescription   Program Oxygen Prescription  None      Home Oxygen   Home Oxygen Device  None    Sleep Oxygen Prescription  None    Home Exercise Oxygen Prescription  None    Home at Rest Exercise Oxygen Prescription  None    Compliance with Home Oxygen Use  Yes      Goals/Expected Outcomes   Short Term Goals  To learn and exhibit compliance with exercise, home and travel O2 prescription;To learn and understand importance of monitoring SPO2 with pulse oximeter and demonstrate accurate use of the pulse oximeter.;To learn and understand importance of maintaining oxygen saturations>88%;To learn and demonstrate proper pursed lip breathing techniques or other breathing techniques.    Long  Term Goals  Exhibits compliance with exercise, home and travel O2 prescription;Verbalizes importance of monitoring SPO2 with pulse oximeter and return demonstration;Maintenance of O2 saturations>88%;Exhibits proper breathing techniques, such as pursed lip breathing or other method taught during program session;Compliance with respiratory medication    Comments  Patient is able to verbalize the importance of monitoring her SPO2 and maintaining her O2 saturation >88% and to demonstrate proper usage of pulse oximeter. She is also able to demonstrate proper pursed lip breathing technique in class and is compliant with her exercise  prescription and medications.    Goals/Expected Outcomes  Patient is meeting her expected goals and outcomes. Will continue to monitor.       Initial Exercise Prescription: Initial Exercise Prescription - 11/16/19 1100      Date of Initial Exercise RX and Referring Provider   Date  11/16/19    Referring Provider  Dr. Everardo All    Expected Discharge Date  02/13/20      Arm Ergometer   Level  1    Watts  7    RPM  30    Minutes  17    METs  1.5      T5 Nustep   Level  1    SPM  49    Minutes  22    METs  1.8      Prescription Details   Frequency (times per week)  2    Duration  Progress to 30 minutes of continuous aerobic without signs/symptoms of physical distress      Intensity   THRR 40-80% of Max Heartrate  97-133-150    Ratings of Perceived Exertion  11-13    Perceived Dyspnea  0-4      Progression   Progression  Continue to progress workloads to maintain intensity without signs/symptoms of physical distress.      Resistance Training   Training Prescription  Yes    Weight  1    Reps  10-15       Perform Capillary Blood Glucose checks as needed.  Exercise Prescription Changes:  Exercise Prescription Changes    Row Name 12/16/19 1400 01/12/20 1800 02/06/20 1600         Response to Exercise   Blood Pressure (Admit)  168/98  140/74  168/84     Blood Pressure (  Exercise)  160/100  158/92  140/88     Blood Pressure (Exit)  150/92  120/86  126/82     Heart Rate (Admit)  104 bpm  88 bpm  92 bpm     Heart Rate (Exercise)  102 bpm  102 bpm  95 bpm     Heart Rate (Exit)  96 bpm  99 bpm  97 bpm     Oxygen Saturation (Admit)  96 %  98 %  97 %     Oxygen Saturation (Exercise)  96 %  93 %  94 %     Oxygen Saturation (Exit)  96 %  94 %  97 %     Rating of Perceived Exertion (Exercise)  12  12  12      Perceived Dyspnea (Exercise)  12  12  12      Symptoms  extreme SOB  extreme SOB  extreme SOB     Duration  Continue with 30 min of aerobic exercise without  signs/symptoms of physical distress.  Continue with 30 min of aerobic exercise without signs/symptoms of physical distress.  Continue with 30 min of aerobic exercise without signs/symptoms of physical distress.     Intensity  THRR unchanged  THRR unchanged  THRR unchanged       Progression   Progression  Continue to progress workloads to maintain intensity without signs/symptoms of physical distress.  Continue to progress workloads to maintain intensity without signs/symptoms of physical distress.  Continue to progress workloads to maintain intensity without signs/symptoms of physical distress.     Average METs  1.8  1.95  2.3       Resistance Training   Training Prescription  Yes  Yes  Yes     Weight  2  3  4      Reps  10-15  10-15  10-15     Time  10 Minutes  10 Minutes  10 Minutes       Arm Ergometer   Level  1.2  1.7  1.9     Watts  13  14  19      RPM  51  55  54     Minutes  17  17  17      METs  1.8  1.9  2.3       T5 Nustep   Level  1  2  2      SPM  88  100  110     Minutes  22  22  22      METs  1.8  2  2.3       Home Exercise Plan   Plans to continue exercise at  Home (comment)  Home (comment)  Home (comment)     Frequency  Add 3 additional days to program exercise sessions.  Add 3 additional days to program exercise sessions.  Add 3 additional days to program exercise sessions.     Initial Home Exercises Provided  11/16/19  11/16/19  11/16/19        Exercise Comments:  Exercise Comments    Row Name 11/16/19 1109 11/24/19 1503 12/16/19 1437 01/12/20 1810 02/06/20 1636   Exercise Comments  Today was patients inital assessment/orientation. She did well considering her residual effects of SOB and Wheezing post COVID-19. She is eager and wants to attend so that she can improve and decrease her SOB.  Will continue to progress as tolerated on exercise machines and weights.  Patient has completed 9 sessions. She is  still dealing with the residuals of having COVID-19. She has a  very difficult time breathing. Inspite of this she still comes consistently and tries very hard on each machine. Will continue to progress her workloads and weights as tolerated.  Jashayla has been consistent with her attendance. She tries very hard with her exercise and progressions in spite of her difficulty breathing. She says that coming to PR is making her stronger. We will continue to progress as tolerated.  Prisha has been consistent with her attendance. She tries very hard with her exercise and progressions in spite of her difficulty breathing. She says that coming to PR is making her stronger. We will continue to progress as tolerated.      Exercise Goals and Review:  Exercise Goals    Row Name 11/16/19 1108             Exercise Goals   Increase Physical Activity  Yes       Intervention  Provide advice, education, support and counseling about physical activity/exercise needs.;Develop an individualized exercise prescription for aerobic and resistive training based on initial evaluation findings, risk stratification, comorbidities and participant's personal goals.       Expected Outcomes  Short Term: Attend rehab on a regular basis to increase amount of physical activity.;Long Term: Add in home exercise to make exercise part of routine and to increase amount of physical activity.       Increase Strength and Stamina  Yes       Intervention  Provide advice, education, support and counseling about physical activity/exercise needs.;Develop an individualized exercise prescription for aerobic and resistive training based on initial evaluation findings, risk stratification, comorbidities and participant's personal goals.       Expected Outcomes  Short Term: Perform resistance training exercises routinely during rehab and add in resistance training at home;Long Term: Improve cardiorespiratory fitness, muscular endurance and strength as measured by increased METs and functional capacity ( )       Able  to understand and use rate of perceived exertion (RPE) scale  Yes       Intervention  Provide education and explanation on how to use RPE scale       Expected Outcomes  Short Term: Able to use RPE daily in rehab to express subjective intensity level;Long Term:  Able to use RPE to guide intensity level when exercising independently       Able to understand and use Dyspnea scale  Yes       Intervention  Provide education and explanation on how to use Dyspnea scale       Expected Outcomes  Short Term: Able to use Dyspnea scale daily in rehab to express subjective sense of shortness of breath during exertion;Long Term: Able to use Dyspnea scale to guide intensity level when exercising independently       Knowledge and understanding of Target Heart Rate Range (THRR)  Yes       Intervention  Provide education and explanation of THRR including how the numbers were predicted and where they are located for reference       Expected Outcomes  Short Term: Able to use daily as guideline for intensity in rehab;Long Term: Able to use THRR to govern intensity when exercising independently       Able to check pulse independently  Yes       Intervention  Provide education and demonstration on how to check pulse in carotid and radial arteries.;Review the importance of being able to check  your own pulse for safety during independent exercise       Expected Outcomes  Short Term: Able to explain why pulse checking is important during independent exercise;Long Term: Able to check pulse independently and accurately       Understanding of Exercise Prescription  Yes       Intervention  Provide education, explanation, and written materials on patient's individual exercise prescription       Expected Outcomes  Short Term: Able to explain program exercise prescription;Long Term: Able to explain home exercise prescription to exercise independently          Exercise Goals Re-Evaluation : Exercise Goals Re-Evaluation    Row  Name 11/24/19 1501 12/16/19 1436 01/12/20 1809 02/06/20 1634       Exercise Goal Re-Evaluation   Exercise Goals Review  Increase Physical Activity;Increase Strength and Stamina  Increase Physical Activity;Increase Strength and Stamina  Increase Physical Activity;Increase Strength and Stamina  Increase Physical Activity;Increase Strength and Stamina    Comments  Patient has just started the program. This is her 3rd visit. We will continue to monitor her progress.  Patient goals are to breathe better and to get back to normal ADL's.  Her goals are to breathe better, get back to her daily life which includes going back to work.  Patient wants to get back to normal ADLs and her daily life. She also wants to breathe better.    Expected Outcomes  Reach her expected goals of breathing better and getting back to normal ADL's. she also wants to get back to work.  Reach her personal goals  Reach her goals  To reach expected goals       Discharge Exercise Prescription (Final Exercise Prescription Changes): Exercise Prescription Changes - 02/06/20 1600      Response to Exercise   Blood Pressure (Admit)  168/84    Blood Pressure (Exercise)  140/88    Blood Pressure (Exit)  126/82    Heart Rate (Admit)  92 bpm    Heart Rate (Exercise)  95 bpm    Heart Rate (Exit)  97 bpm    Oxygen Saturation (Admit)  97 %    Oxygen Saturation (Exercise)  94 %    Oxygen Saturation (Exit)  97 %    Rating of Perceived Exertion (Exercise)  12    Perceived Dyspnea (Exercise)  12    Symptoms  extreme SOB    Duration  Continue with 30 min of aerobic exercise without signs/symptoms of physical distress.    Intensity  THRR unchanged      Progression   Progression  Continue to progress workloads to maintain intensity without signs/symptoms of physical distress.    Average METs  2.3      Resistance Training   Training Prescription  Yes    Weight  4    Reps  10-15    Time  10 Minutes      Arm Ergometer   Level  1.9     Watts  19    RPM  54    Minutes  17    METs  2.3      T5 Nustep   Level  2    SPM  110    Minutes  22    METs  2.3      Home Exercise Plan   Plans to continue exercise at  Home (comment)    Frequency  Add 3 additional days to program exercise sessions.    Initial  Home Exercises Provided  11/16/19       Nutrition:  Target Goals: Understanding of nutrition guidelines, daily intake of sodium 1500mg , cholesterol 200mg , calories 30% from fat and 7% or less from saturated fats, daily to have 5 or more servings of fruits and vegetables.  Biometrics: Pre Biometrics - 11/16/19 1111      Pre Biometrics   Height  5\' 2"  (1.575 m)    Weight  101.8 kg    Waist Circumference  44 inches    Hip Circumference  45.5 inches    Waist to Hip Ratio  0.97 %    BMI (Calculated)  41.05    Triceps Skinfold  26 mm    % Body Fat  48.4 %    Grip Strength  20.8 kg    Flexibility  7.6 in    Single Leg Stand  25 seconds        Nutrition Therapy Plan and Nutrition Goals: Nutrition Therapy & Goals - 02/03/20 1528      Personal Nutrition Goals   Comments  We continue to work with patient and RD to schedule RD classes. Her medificts diet assessment score was 33. She continues to say she is trying to eat healthy. Will continue to monitor for progress.      Intervention Plan   Intervention  Nutrition handout(s) given to patient.       Nutrition Assessments: Nutrition Assessments - 11/16/19 1124      MEDFICTS Scores   Pre Score  33       Nutrition Goals Re-Evaluation:   Nutrition Goals Discharge (Final Nutrition Goals Re-Evaluation):   Psychosocial: Target Goals: Acknowledge presence or absence of significant depression and/or stress, maximize coping skills, provide positive support system. Participant is able to verbalize types and ability to use techniques and skills needed for reducing stress and depression.  Initial Review & Psychosocial Screening: Initial Psych Review & Screening  - 11/16/19 1120      Initial Review   Current issues with  Current Anxiety/Panic   Due to not being able to breath post [place]?  Yes      Barriers   Psychosocial barriers to participate in program  There are no identifiable barriers or psychosocial needs.      Screening Interventions   Interventions  Encouraged to exercise       Quality of Life Scores: Quality of Life - 11/16/19 1121      Quality of Life   Select  Quality of Life      Quality of Life Scores   Health/Function Pre  20.88 %    Socioeconomic Pre  20.25 %    Psych/Spiritual Pre  20.86 %    Family Pre  20.8 %    GLOBAL Pre  20.72 %      Scores of 19 and below usually indicate a poorer quality of life in these areas.  A difference of  2-3 points is a clinically meaningful difference.  A difference of 2-3 points in the total score of the Quality of Life Index has been associated with significant improvement in overall quality of life, self-image, physical symptoms, and general health in studies assessing change in quality of life.   PHQ-9: Recent Review Flowsheet Data    Depression screen [facility] 2/9 11/16/2019   Decreased Interest 0   Down, Depressed, Hopeless 0   PHQ - 2 Score 0   Altered sleeping 1  Tired, decreased energy 2   Change in appetite 1   Feeling bad or failure about yourself  0   Trouble concentrating 0   Moving slowly or fidgety/restless 0   Suicidal thoughts 0   PHQ-9 Score 4   Difficult doing work/chores Somewhat difficult     Interpretation of Total Score  Total Score Depression Severity:  1-4 = Minimal depression, 5-9 = Mild depression, 10-14 = Moderate depression, 15-19 = Moderately severe depression, 20-27 = Severe depression   Psychosocial Evaluation and Intervention: Psychosocial Evaluation - 11/16/19 1121      Psychosocial Evaluation & Interventions   Interventions  Encouraged to exercise with the program and follow exercise  prescription    Continue Psychosocial Services   No Follow up required       Psychosocial Re-Evaluation: Psychosocial Re-Evaluation    Row Name 11/24/19 1541 12/15/19 1529 01/13/20 1416 02/03/20 1528       Psychosocial Re-Evaluation   Current issues with  Current Anxiety/Panic  Current Anxiety/Panic  Current Anxiety/Panic  Current Anxiety/Panic    Comments  Patient's initial QOL score was 20.72 and her PHQ-9 score was 4. She does report anxiety related to her SOB. She is currently not being treated for her anxiety. Will continue to monitor.  Patient's initial QOL score was 20.72 and her PHQ-9 score was 4. She does report anxiety related to her SOB. She is currently not being treated for her anxiety. Will continue to monitor.  Patient's initial QOL score was 20.72 and her PHQ-9 score was 4. She does report anxiety related to her SOB. She is currently not being treated for her anxiety. Will continue to monitor.  Patient's initial QOL score was 20.72 and her PHQ-9 score was 4. She does report anxiety related to her SOB. She is currently not being treated for her anxiety. Will continue to monitor.    Expected Outcomes  Patient will have no additional psychosocial issues identified at discharge.  Patient will have no additional psychosocial issues identified at discharge.  Patient will have no additional psychosocial issues identified at discharge.  Patient will have no additional psychosocial issues identified at discharge.    Interventions  Relaxation education;Encouraged to attend Pulmonary Rehabilitation for the exercise;Stress management education  Relaxation education;Encouraged to attend Pulmonary Rehabilitation for the exercise;Stress management education  Relaxation education;Encouraged to attend Pulmonary Rehabilitation for the exercise;Stress management education  Relaxation education;Encouraged to attend Pulmonary Rehabilitation for the exercise;Stress management education    Continue  Psychosocial Services   Follow up required by staff  Follow up required by staff  No Follow up required  No Follow up required       Psychosocial Discharge (Final Psychosocial Re-Evaluation): Psychosocial Re-Evaluation - 02/03/20 1528      Psychosocial Re-Evaluation   Current issues with  Current Anxiety/Panic    Comments  Patient's initial QOL score was 20.72 and her PHQ-9 score was 4. She does report anxiety related to her SOB. She is currently not being treated for her anxiety. Will continue to monitor.    Expected Outcomes  Patient will have no additional psychosocial issues identified at discharge.    Interventions  Relaxation education;Encouraged to attend Pulmonary Rehabilitation for the exercise;Stress management education    Continue Psychosocial Services   No Follow up required        Education: Education Goals: Education classes will be provided on a weekly basis, covering required topics. Participant will state understanding/return demonstration of topics presented.  Learning Barriers/Preferences: Learning Barriers/Preferences - 11/16/19  1004      Learning Barriers/Preferences   Learning Barriers  None    Learning Preferences  Pictoral;Group Instruction;Video;Individual Instruction;Skilled Demonstration       Education Topics: How Lungs Work and Diseases: - Discuss the anatomy of the lungs and diseases that can affect the lungs, such as COPD.   PULMONARY REHAB OTHER RESPIRATORY from 01/26/2020 in [facility]  Date  12/29/19  Educator  D. Coad  Instruction Review Code  2- Demonstrated Understanding      Exercise: -Discuss the importance of exercise, FITT principles of exercise, normal and abnormal responses to exercise, and how to exercise safely.   Environmental Irritants: -Discuss types of environmental irritants and how to limit exposure to environmental irritants.   Meds/Inhalers and oxygen: - Discuss respiratory medications,  definition of an inhaler and oxygen, and the proper way to use an inhaler and oxygen.   PULMONARY REHAB OTHER RESPIRATORY from 01/26/2020 in [place] [facility]  Date  01/12/20  Educator  DC      Energy Saving Techniques: - Discuss methods to conserve energy and decrease shortness of breath when performing activities of daily living.    Bronchial Hygiene / Breathing Techniques: - Discuss breathing mechanics, pursed-lip breathing technique,  proper posture, effective ways to clear airways, and other functional breathing techniques   Cleaning Equipment: - Provides group verbal and written instruction about the health risks of elevated stress, cause of high stress, and healthy ways to reduce stress.   PULMONARY REHAB OTHER RESPIRATORY from 01/26/2020 in [place] [facility]  Date  01/26/20  Educator  DC  Instruction Review Code  2- Demonstrated Understanding      Nutrition I: Fats: - Discuss the types of cholesterol, what cholesterol does to the body, and how cholesterol levels can be controlled.   Nutrition II: Labels: -Discuss the different components of food labels and how to read food labels.   Respiratory Infections: - Discuss the signs and symptoms of respiratory infections, ways to prevent respiratory infections, and the importance of seeking medical treatment when having a respiratory infection.   PULMONARY REHAB OTHER RESPIRATORY from 01/26/2020 in [place] [facility]  Date  11/24/19  Educator  DLaural Benes  Instruction Review Code  2- Demonstrated Understanding      Stress I: Signs and Symptoms: - Discuss the causes of stress, how stress may lead to anxiety and depression, and ways to limit stress.   PULMONARY REHAB OTHER RESPIRATORY from 01/26/2020 in [place] [facility]  Date  12/01/19  Educator  DC  Instruction Review Code  2- Demonstrated Understanding      Stress II: Relaxation: -Discuss relaxation techniques  to limit stress.   PULMONARY REHAB OTHER RESPIRATORY from 01/26/2020 in [place] [facility]  Date  12/08/19  Educator  Timoteo Expose  Instruction Review Code  2- Demonstrated Understanding      Oxygen for Home/Travel: - Discuss how to prepare for travel when on oxygen and proper ways to transport and store oxygen to ensure safety.   PULMONARY REHAB OTHER RESPIRATORY from 01/26/2020 in [place] [facility]  Date  12/15/19  Educator  Timoteo Expose  Instruction Review Code  2- Demonstrated Understanding      Knowledge Questionnaire Score: Knowledge Questionnaire Score - 11/16/19 1005      Knowledge Questionnaire Score   Pre Score  15/18       Core Components/Risk Factors/Patient Goals at Admission: Personal Goals and Risk Factors at Admission - 11/16/19  1125      Core Components/Risk Factors/Patient Goals on Admission    Weight Management  Weight Maintenance    Personal Goal Other  Yes    Personal Goal  Breath better, Be able to get back to normal ADL's, Get back to her daily life and to be able to return to work.    Intervention  Attend program 2 x week and to supplement with 3 x week home exercise plan.    Expected Outcomes  Reach expected goals.       Core Components/Risk Factors/Patient Goals Review:  Goals and Risk Factor Review    Row Name 11/24/19 1539 12/15/19 1526 01/13/20 1413 02/03/20 1528       Core Components/Risk Factors/Patient Goals Review   Personal Goals Review  Weight Management/Obesity Get back to ADL's; and get back to her daily life. Breathe better.  Weight Management/Obesity Get bacl to ADL's; Get back to her daily life; breathe better.  Weight Management/Obesity Get back to ADL's; Get back to her daily life; breathe better.  Weight Management/Obesity Get back to ADL's; and get back to her daily life; breathe better.    Review  Patient is new to the program. She has completed 3 sessions. She does say however that she is doing more at  home since she has been attending the program because she is not as afraid to do more. Will continue to monitor for progress.  Patient has completed 9 sessions gaining 3 llbs since she started the program. She is doing well in the program with some progression. She says she has good days and bad days with her breathing. She reports recently being exposed to yard mowing which has exacerbated her SOB and wheezing. She had to use her MDI during the session today. She still however feels like she is making progress in the program having more "good" days and attempting to do more at home. Will continue to monitor for progress.  Patient has completed 16 sessions gaining 4 lbs since last 30 day review. She continues to do well in the program with progression. She reports doing more at home on the days she feels stronger. She feels like the program has given her more strength. Her SOB has improved. She is able to walk from her car in the parking lot into the building to the gym now without having to stop and rest of getting extremely SOB. Will continue to monitor for progress.  Patient has completed 22 sessions gaining 1 lb since last 30 day review. She continues to do well in the program with progression. She says she continues to do more at home on the days she feels stronger. She continues to have her days of increased SOB and fatigue. She continues to improve on her ability to walk into the building and to the gym without having to stop. She is doing well on her machines. She feels she is making progress. Will continue to monitor for progress.    Expected Outcomes  Patient will continue to attend sessions and complete the program meeting her personal goals.  Patient will continue to attend sessions and complete the program meeting her personal goals.  Patient will continue to attend sessions and complete the program meeting both personal and program goals.  Patient will continue to attend sessions and complete the  program meeting both personal and program goals.       Core Components/Risk Factors/Patient Goals at Discharge (Final Review):  Goals and Risk Factor Review -  02/03/20 1528      Core Components/Risk Factors/Patient Goals Review   Personal Goals Review  Weight Management/Obesity   Get back to ADL's; and get back to her daily life; breathe better.   Review  Patient has completed 22 sessions gaining 1 lb since last 30 day review. She continues to do well in the program with progression. She says she continues to do more at home on the days she feels stronger. She continues to have her days of increased SOB and fatigue. She continues to improve on her ability to walk into the building and to the gym without having to stop. She is doing well on her machines. She feels she is making progress. Will continue to monitor for progress.    Expected Outcomes  Patient will continue to attend sessions and complete the program meeting both personal and program goals.       ITP Comments: ITP Comments    Row Name 11/16/19 1027           ITP Comments  Patient is coming to use post COVID-19 with pneumonia which has left her extremely SOB on exertion. Patient is eager to get started.          Comments: ITP REVIEW Pt is making expected progress toward pulmonary rehab goals after completing 22 sessions. Recommend continued exercise, life style modification, education, and utilization of breathing techniques to increase stamina and strength and decrease shortness of breath with exertion.

## 2020-02-07 ENCOUNTER — Encounter (HOSPITAL_COMMUNITY)
Admission: RE | Admit: 2020-02-07 | Discharge: 2020-02-07 | Disposition: A | Discharge status: Home / Self Care | Payer: No Typology Code available for payment source | Source: Ambulatory Visit | Attending: Pulmonary Disease | Admitting: Pulmonary Disease

## 2020-02-07 ENCOUNTER — Other Ambulatory Visit: Payer: Self-pay

## 2020-02-07 DIAGNOSIS — R0602 Shortness of breath: Secondary | ICD-10-CM | POA: Diagnosis not present

## 2020-02-07 DIAGNOSIS — J410 Simple chronic bronchitis: Secondary | ICD-10-CM

## 2020-02-07 NOTE — Progress Notes (Signed)
Daily Session Note  Patient Details  Name: Jane Bennett MRN: [id number] Date of Birth: 1966-02-16 Referring Provider:     PULMONARY REHAB OTHER RESP ORIENTATION from 11/16/2019 in [place]  Referring Provider  Dr. Loanne Drilling      Encounter Date: 02/07/2020  Check In: Session Check In - 02/07/20 1045      Check-In   Supervising physician immediately available to respond to emergencies  See telemetry face sheet for immediately available MD    Location  [facility]    Staff Present  [name], MS, EP, [facility], Exercise Physiologist;Other    Virtual Visit  No    Medication changes reported      No    Fall or balance concerns reported     No    Tobacco Cessation  No Change    Warm-up and Cool-down  Performed as group-led instruction    Resistance Training Performed  Yes    VAD Patient?  No    PAD/SET Patient?  No      Pain Assessment   Currently in Pain?  No/denies    Pain Score  0-No pain    Multiple Pain Sites  No       Capillary Blood Glucose: No results found for this or any previous visit (from the past 24 hour(s)).    Social History   Tobacco Use  Smoking Status Never Smoker  Smokeless Tobacco Never Used    Goals Met:  Proper associated with RPD/PD & O2 Sat Independence with exercise equipment Improved SOB with ADL's Using PLB without cueing & demonstrates good technique Exercise tolerated well Personal goals reviewed No report of cardiac concerns or symptoms Strength training completed today  Goals Unmet:  Not Applicable  Comments: Check out: 230   Dr. Kate Sable is Medical Director for [place] and Pulmonary Rehab.

## 2020-02-09 ENCOUNTER — Encounter (HOSPITAL_COMMUNITY)
Admission: RE | Admit: 2020-02-09 | Discharge: 2020-02-09 | Disposition: A | Discharge status: Home / Self Care | Payer: No Typology Code available for payment source | Source: Ambulatory Visit | Attending: Pulmonary Disease | Admitting: Pulmonary Disease

## 2020-02-09 ENCOUNTER — Other Ambulatory Visit: Payer: Self-pay

## 2020-02-09 DIAGNOSIS — R0602 Shortness of breath: Secondary | ICD-10-CM | POA: Diagnosis not present

## 2020-02-09 NOTE — Progress Notes (Signed)
Daily Session Note  Patient Details  Name: Jane Bennett MRN: [id number] Date of Birth: March 01, 1966 Referring Provider:     PULMONARY REHAB OTHER RESP ORIENTATION from 11/16/2019 in [place]  Referring Provider  Dr. Loanne Drilling      Encounter Date: 02/09/2020  Check In: Session Check In - 02/09/20 1045      Check-In   Supervising physician immediately available to respond to emergencies  See telemetry face sheet for immediately available MD    Location  [facility]    Staff Present  [name], MS, EP, [facility], Exercise Physiologist;Debra [name], RN, BSN;Other    Virtual Visit  No    Medication changes reported      No    Fall or balance concerns reported     No    Tobacco Cessation  No Change    Warm-up and Cool-down  Performed as group-led instruction    Resistance Training Performed  Yes    VAD Patient?  No    PAD/SET Patient?  No      Pain Assessment   Currently in Pain?  No/denies    Pain Score  0-No pain    Multiple Pain Sites  No       Capillary Blood Glucose: No results found for this or any previous visit (from the past 24 hour(s)).    Social History   Tobacco Use  Smoking Status Never Smoker  Smokeless Tobacco Never Used    Goals Met:  Proper associated with RPD/PD & O2 Sat Independence with exercise equipment Improved SOB with ADL's Using PLB without cueing & demonstrates good technique Exercise tolerated well No report of cardiac concerns or symptoms Strength training completed today  Goals Unmet:  Not Applicable  Comments: Check out: 1145   Dr. Kate Sable is Medical Director for [place] and Pulmonary Rehab.

## 2020-02-14 ENCOUNTER — Encounter (HOSPITAL_COMMUNITY)
Admission: RE | Admit: 2020-02-14 | Discharge: 2020-02-14 | Disposition: A | Discharge status: Home / Self Care | Payer: No Typology Code available for payment source | Source: Ambulatory Visit | Attending: Pulmonary Disease | Admitting: Pulmonary Disease

## 2020-02-14 ENCOUNTER — Other Ambulatory Visit: Payer: Self-pay

## 2020-02-14 DIAGNOSIS — R0602 Shortness of breath: Secondary | ICD-10-CM

## 2020-02-14 DIAGNOSIS — J410 Simple chronic bronchitis: Secondary | ICD-10-CM

## 2020-02-14 NOTE — Progress Notes (Signed)
Daily Session Note  Patient Details  Name: Jane Bennett MRN: [id number] Date of Birth: 1965/11/12 Referring Provider:     PULMONARY REHAB OTHER RESP ORIENTATION from 11/16/2019 in [place]  Referring Provider  Dr. Loanne Drilling      Encounter Date: 02/14/2020  Check In: Session Check In - 02/14/20 1045      Check-In   Supervising physician immediately available to respond to emergencies  See telemetry face sheet for immediately available MD    Location  [facility]    Staff Present  [name], MS, EP, [facility], Exercise Physiologist;Vaniece Hatchett, Exercise Physiologist;Other    Virtual Visit  No    Medication changes reported      No    Fall or balance concerns reported     No    Tobacco Cessation  No Change    Warm-up and Cool-down  Performed as group-led instruction    Resistance Training Performed  Yes    VAD Patient?  No    PAD/SET Patient?  No      Pain Assessment   Currently in Pain?  No/denies    Pain Score  0-No pain    Multiple Pain Sites  No       Capillary Blood Glucose: No results found for this or any previous visit (from the past 24 hour(s)).    Social History   Tobacco Use  Smoking Status Never Smoker  Smokeless Tobacco Never Used    Goals Met:  Proper associated with RPD/PD & O2 Sat Independence with exercise equipment Improved SOB with ADL's Using PLB without cueing & demonstrates good technique Exercise tolerated well No report of cardiac concerns or symptoms Strength training completed today  Goals Unmet:  Not Applicable  Comments: Check out: 1145   Dr. Kathie Dike is Medical Director for [facility] Pulmonary Rehab.

## 2020-02-16 ENCOUNTER — Other Ambulatory Visit: Payer: Self-pay

## 2020-02-16 ENCOUNTER — Encounter (HOSPITAL_COMMUNITY)
Admission: RE | Admit: 2020-02-16 | Discharge: 2020-02-16 | Disposition: A | Discharge status: Home / Self Care | Payer: No Typology Code available for payment source | Source: Ambulatory Visit | Attending: Pulmonary Disease | Admitting: Pulmonary Disease

## 2020-02-16 DIAGNOSIS — J410 Simple chronic bronchitis: Secondary | ICD-10-CM

## 2020-02-16 DIAGNOSIS — R0602 Shortness of breath: Secondary | ICD-10-CM

## 2020-02-16 NOTE — Progress Notes (Signed)
Daily Session Note  Patient Details  Name: Jane Bennett MRN: [id number] Date of Birth: June 07, 1966 Referring Provider:     PULMONARY REHAB OTHER RESP ORIENTATION from 11/16/2019 in [place]  Referring Provider  Dr. Loanne Drilling      Encounter Date: 02/16/2020  Check In: Session Check In - 02/16/20 1138      Check-In   Supervising physician immediately available to respond to emergencies  See telemetry face sheet for immediately available MD    Location  [facility]    Staff Present  [name], MS, EP, [facility], Exercise Physiologist;Vaniece Hatchett, Exercise Physiologist    Virtual Visit  No    Medication changes reported      No    Fall or balance concerns reported     No    Tobacco Cessation  No Change    Warm-up and Cool-down  Performed as group-led instruction    Resistance Training Performed  Yes    VAD Patient?  No    PAD/SET Patient?  No      Pain Assessment   Currently in Pain?  No/denies    Pain Score  0-No pain    Multiple Pain Sites  No       Capillary Blood Glucose: No results found for this or any previous visit (from the past 24 hour(s)).    Social History   Tobacco Use  Smoking Status Never Smoker  Smokeless Tobacco Never Used    Goals Met:  Proper associated with RPD/PD & O2 Sat Independence with exercise equipment Improved SOB with ADL's Using PLB without cueing & demonstrates good technique Exercise tolerated well Personal goals reviewed No report of cardiac concerns or symptoms Strength training completed today  Goals Unmet:  Not Applicable  Comments: Check out: 1145   Dr. Kathie Dike is Medical Director for [facility] Pulmonary Rehab.

## 2020-02-17 ENCOUNTER — Ambulatory Visit (INDEPENDENT_AMBULATORY_CARE_PROVIDER_SITE_OTHER): Payer: Self-pay | Admitting: Primary Care

## 2020-02-17 VITALS — BP 128/80 | HR 96 | Temp 98.8°F | Ht 62.0 in | Wt 230.8 lb

## 2020-02-17 DIAGNOSIS — J4542 Moderate persistent asthma with status asthmaticus: Secondary | ICD-10-CM

## 2020-02-17 DIAGNOSIS — Z8616 Personal history of COVID-19: Secondary | ICD-10-CM

## 2020-02-17 DIAGNOSIS — J383 Other diseases of vocal cords: Secondary | ICD-10-CM

## 2020-02-17 DIAGNOSIS — I82491 Acute embolism and thrombosis of other specified deep vein of right lower extremity: Secondary | ICD-10-CM

## 2020-02-17 MED ORDER — PREDNISONE 10 MG PO TABS: ORAL_TABLET | ORAL | 0 refills | Status: DC

## 2020-02-17 MED ORDER — METHYLPREDNISOLONE ACETATE 80 MG/ML IJ SUSP
80.0000 mg | Freq: Once | INTRAMUSCULAR | Status: AC
  Administered 2020-02-17: 80 mg via INTRAMUSCULAR

## 2020-02-17 NOTE — Assessment & Plan Note (Signed)
-   Patient saw ENT on 11/30/2019 at [facility] with Dr. Jenne Pane. 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 He referred her to speech pathology (see note)

## 2020-02-17 NOTE — Patient Instructions (Addendum)
Recommendations: Continue Advair and Spiriva Continue Pepcid Continue Flonase   Office treatment: DepoMedrol 80mg  IM injection  Rx: Prednisone 20mg  x 2 weeks; 10mg  x 2 weeks   Follow-up: 2-4 weeks with Dr. or (new patient for COVID pneumonia/asthma)

## 2020-02-17 NOTE — Assessment & Plan Note (Addendum)
-   Seen today for acute asthma exacerbation. Continues to have significant upperairway wheezing with difficulty breathing and cough. PFTs showed FEV1 1.06 (41%), ratio 84, positive bronchodilator response.  - Recommendations: Continue Advair 230 mcg 2 puffs BID and Spiriva; Pepcid 20mg  at bedtime for GERD which is likely contributing to upper airway wheezing and Flonase nasal spray once daily. Continue pulmonary rehab - Office treatment: DepoMedrol 80mg  IM injection - Rx: Prednisone 20mg  x 2 weeks; 10mg  x 2 weeks

## 2020-02-17 NOTE — Assessment & Plan Note (Signed)
-   CTA and doppler studies negative for PE/DVT. Patient discontinued xarelto

## 2020-02-17 NOTE — Assessment & Plan Note (Addendum)
-   She had no breathing issues prior to getting covid, her symptoms are 100% related to moderately-severe restrictive lung disease that is a direct result from previous COVID-19 pneumonia. As a result she has upper airway wheezing and vocal cord dysfunction and would greatly benefit from vocal rehab.  - CTA1/19 2021: Negative for pulmonary embolism. Faint bilateral patchy groundglass opacities without evidence of interstitial thickening - Follow-up:2-4 weeks with Dr. Isaiah Serge or Marchelle Gearing (new patient for COVID pneumonia/asthma)

## 2020-02-17 NOTE — Progress Notes (Signed)
@Patient  ID: Jane Bennett, female    DOB: 1965/12/07, 54 y.o.   MRN: [id number]  Chief Complaint  Patient presents with  . Follow-up    wheezing and coughing associated with chest tightness    Referring provider: [facility]  HPI: Patient is a 54 yo female, never smoker. PMH significant for hx of covid 19 pneumonia. Patient of Dr. Loanne Drilling and last seen on 11/02/2019 for wheezing. These symptoms were persistent after the patient had covid-19 pneumonia from 09/13/2019.  Patient reports associated SOB with exertion. Possible DVT in January of 2021, started her on Xarelto prophylactically. Patient was started on Advair 230 mcg 2 puffs BID. Referred to pulmonary rehab and ENT for upper airway wheezing. Ordered for PFTs and DVT ultrasound.  Previous [facility] pulmonary encounters:  01/06/2020 Patient here for follow up office visit and PFT. She has seen some minor improvement but is still having persistent audible upper airway wheezing and shortness of breath with any exertion. Heat exacerbates her dyspnea. She will wake up at least once a night with shortness of breath having to use rescue inhaler. She sleeps with 3 pillows. Patient was using albuterol 6 times a day and now only uses albuterol 2-3 times a day since starting advair.  She does complain of some allergy symptoms today such as watery eyes, post-nasal drip. No sneezing or congestion. Interested in possibly trying an allergy medication. Denies any GERD symptoms. No heart burn, belching, acid indigestion.   She saw ENT on 11/30/2019 at [facility] with Dr. Redmond Baseman. 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 He referred her to speech pathology. Patient states case worker has not set up appointment with speech pathology and it's been over a month.   Pulmonary rehab started in November 16, 2019. Doing exercises to strenthen chest muscles to help with  deep breathing. Still gets sob with exertion and hasn't been able to do treadmill exercises yet.   At the end of the visit, workers comp [employer] information [employer] manager brought in at end of visit which was approved by patient to be filled in on HPI.  01/20/2020 Patient contacted today for 2 week follow-up. She has noticed some improvement in wheezing with prednisone course and trial Spiriva. Heat continues to make her symptoms worse. Reports that phelgm gets caught in the back of her throat. States that she wakes up at night with cough. Noticed some associated belching/GERD symptoms when asked. She is still going to pulmonary rehab. She has not heard from speech pathology.   02/17/2020 Patient presents today for follow-up visit. She is struggling today with cough and wheezing. Heat makes her symptoms worse. She has been attending pulmonary rehab, the last two days have been difficulty d/t heat. She coughs to the point of gagging. Reports that her chest is also very tight. She is using her Advair twice daily and Spiriva respimat. Using albuterol at least every 4 hours the last several days. She feels pepcid has helped some. Prednisone has helped in the past. Vocal rehab continues to be denied. ENT has re-sent referral but she has not heard.    PFTs: FVC 1.27 (39), 1.06 (41%), ratio 84, positive bronchodilator response  Moderate-severe restrictive lung disease   Imaging: CTA 1/19 2021: Negative for pulmonary embolism. Faint bilateral patchy groundglass opacities without evidence of interstitial thickening, effusion or edema. REVIEWED BY DR. Loanne Drilling    Allergies  Allergen Reactions  . Other Hives  .  Sulfa Antibiotics Hives  . Bee Venom Hives    Immunization History  Administered Date(s) Administered  . Influenza Whole 07/04/2019    Past Medical History:  Diagnosis Date  . DVT (deep venous thrombosis) (HCC)   . High cholesterol   . Pneumonia     Tobacco History: Social History   Tobacco Use   Smoking Status Never Smoker  Smokeless Tobacco Never Used   Counseling given: Not Answered   Outpatient Medications Prior to Visit  Medication Sig Dispense Refill  . albuterol (VENTOLIN HFA) 108 (90 Base) MCG/ACT inhaler INHALE 1 TO 2 PUFFS BY MOUTH EVERY 4 TO 6 HOURS 18 g 3  . Cetirizine HCl 10 MG CAPS Take 1 capsule (10 mg total) by mouth daily. 30 capsule 1  . famotidine (PEPCID) 20 MG tablet Take 1 tablet (20 mg total) by mouth at bedtime. 30 tablet 1  . fluticasone (FLONASE) 50 MCG/ACT nasal spray Place 1 spray into both nostrils daily. 16 g 2  . fluticasone-salmeterol (ADVAIR HFA) 230-21 MCG/ACT inhaler Inhale 2 puffs into the lungs 2 (two) times daily. 1 Inhaler 12  . losartan-hydrochlorothiazide (HYZAAR) 50-12.5 MG tablet Take 1 tablet by mouth daily.    . ondansetron (ZOFRAN-ODT) 8 MG disintegrating tablet Take 8 mg by mouth every 6 (six) hours as needed.    . Tiotropium Bromide Monohydrate (SPIRIVA RESPIMAT) 1.25 MCG/ACT AERS Inhale 2 puffs into the lungs daily. 4 g 5  . rivaroxaban (XARELTO) 20 MG TABS tablet Take 20 mg by mouth daily with supper.     No facility-administered medications prior to visit.   Review of Systems  Review of Systems  Respiratory: Positive for cough, chest tightness, shortness of breath, wheezing and stridor.    Physical Exam  BP 128/80   Pulse 96   Temp 98.8 F (37.1 C) (Oral)   Ht 5\' 2"  (1.575 m)   Wt 230 lb 12.8 oz (104.7 kg)   SpO2 94%   BMI 42.21 kg/m  Physical Exam Constitutional:      Appearance: Normal appearance.  Cardiovascular:     Rate and Rhythm: Normal rate and regular rhythm.  Pulmonary:     Breath sounds: Stridor present. Wheezing present.     Comments: Wheezing t/o lung fields Neurological:     Mental Status: She is alert.  Psychiatric:        Mood and Affect: Mood normal.        Behavior: Behavior normal.        Thought Content: Thought content normal.        Judgment: Judgment normal.      Lab  Results:  CBC    Component Value Date/Time   WBC 8.7 10/11/2019 2010   RBC 4.80 10/11/2019 2010   HGB 13.5 10/11/2019 2010   HCT 42.8 10/11/2019 2010   PLT 223 10/11/2019 2010   MCV 89.2 10/11/2019 2010   MCH 28.1 10/11/2019 2010   MCHC 31.5 10/11/2019 2010   RDW 15.1 10/11/2019 2010   LYMPHSABS 3.5 10/11/2019 2010   MONOABS 0.5 10/11/2019 2010   EOSABS 0.2 10/11/2019 2010   BASOSABS 0.1 10/11/2019 2010    BMET    Component Value Date/Time   NA 139 01/06/2020 1122   K 4.0 01/06/2020 1122   CL 101 01/06/2020 1122   CO2 30 01/06/2020 1122   GLUCOSE 96 01/06/2020 1122   BUN 13 01/06/2020 1122   CREATININE 0.85 01/06/2020 1122   CALCIUM 9.6 01/06/2020 1122   GFRNONAA >60 10/11/2019  2010   GFRAA >60 10/11/2019 2010    BNP No results found for: BNP  ProBNP No results found for: PROBNP  Imaging: No results found.   Assessment & Plan:   History of COVID-19 - She had no breathing issues prior to getting covid, her symptoms are 100% related to moderately-severe restrictive lung disease that is a direct result from previous COVID-19 pneumonia. As a result she has upper airway wheezing and vocal cord dysfunction and would greatly benefit from vocal rehab.  - CTA1/19 2021: Negative for pulmonary embolism. Faint bilateral patchy groundglass opacities without evidence of interstitial thickening - Follow-up:2-4 weeks with Dr. Isaiah Serge or Marchelle Gearing (new patient for COVID pneumonia/asthma)  Moderate persistent asthma - Seen today for acute asthma exacerbation. Continues to have significant upperairway wheezing with difficulty breathing and cough. PFTs showed FEV1 1.06 (41%), ratio 84, positive bronchodilator response.  - Recommendations: Continue Advair 230 mcg 2 puffs BID and Spiriva; Pepcid 20mg  at bedtime for GERD which is likely contributing to upper airway wheezing and Flonase nasal spray once daily. Continue pulmonary rehab - Office treatment: DepoMedrol 80mg  IM injection -  Rx: Prednisone 20mg  x 2 weeks; 10mg  x 2 weeks   Vocal cord dysfunction - Patient saw ENT on 11/30/2019 [facility] with Dr. . 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 He referred her to speech pathology (see note)  Deep venous thrombosis (HCC) - CTA and doppler studies negative for PE/DVT. Patient discontinued xarelto   , NP 02/17/2020

## 2020-02-18 ENCOUNTER — Other Ambulatory Visit: Payer: Self-pay | Admitting: Primary Care

## 2020-02-18 DIAGNOSIS — J302 Other seasonal allergic rhinitis: Secondary | ICD-10-CM

## 2020-02-21 ENCOUNTER — Telehealth: Payer: Self-pay | Admitting: Pulmonary Disease

## 2020-02-21 ENCOUNTER — Encounter (HOSPITAL_COMMUNITY)
Admission: RE | Admit: 2020-02-21 | Discharge: 2020-02-21 | Disposition: A | Discharge status: Home / Self Care | Payer: No Typology Code available for payment source | Source: Ambulatory Visit | Attending: Pulmonary Disease | Admitting: Pulmonary Disease

## 2020-02-21 ENCOUNTER — Other Ambulatory Visit: Payer: Self-pay

## 2020-02-21 DIAGNOSIS — J410 Simple chronic bronchitis: Secondary | ICD-10-CM | POA: Diagnosis present

## 2020-02-21 DIAGNOSIS — R0602 Shortness of breath: Secondary | ICD-10-CM | POA: Insufficient documentation

## 2020-02-21 NOTE — Telephone Encounter (Signed)
PA request was received from (pharmacy): [facility] in [place] Phone: [phone]  Medication name and strength: Advair HFA 230-21 mcg Ordering Provider: JE  Was PA started with CMM?: Yes If yes, please enter KEY: [id number] Medication tried and failed: Spiriva 1.56mcg Covered Alternatives: Symbicort , Breo , Flovent HFA , Trelegy, Serevent, Dulera , AirDuo Digihaler 113/32mcg  PA sent to plan, time frame for approval / denial: 72hrs Routing to Lauren for follow-up

## 2020-02-21 NOTE — Progress Notes (Signed)
Daily Session Note  Patient Details  Name: Jane Bennett MRN: [id number] Date of Birth: Apr 16, 1966 Referring Provider:     PULMONARY REHAB OTHER RESP ORIENTATION from 11/16/2019 in [place]  Referring Provider  Dr. Loanne Drilling      Encounter Date: 02/21/2020  Check In: Session Check In - 02/21/20 1045      Check-In   Supervising physician immediately available to respond to emergencies  See telemetry face sheet for immediately available MD    Location  [facility]    Staff Present  [name], MS, EP, [facility], Exercise Physiologist;Vaniece Hatchett, Exercise Physiologist    Virtual Visit  No    Medication changes reported      Yes    Comments  Due to acute asthma exacerbation these meds where given: Depoomedrol injection, Prednisone tappered dose.    Fall or balance concerns reported     No    Tobacco Cessation  No Change    Warm-up and Cool-down  Performed as group-led instruction    Resistance Training Performed  Yes    VAD Patient?  No    PAD/SET Patient?  No      Pain Assessment   Currently in Pain?  No/denies    Pain Score  0-No pain    Multiple Pain Sites  No       Capillary Blood Glucose: No results found for this or any previous visit (from the past 24 hour(s)).    Social History   Tobacco Use  Smoking Status Never Smoker  Smokeless Tobacco Never Used    Goals Met:  Proper associated with RPD/PD & O2 Sat Independence with exercise equipment Improved SOB with ADL's Using PLB without cueing & demonstrates good technique Exercise tolerated well Personal goals reviewed No report of cardiac concerns or symptoms Strength training completed today  Goals Unmet:  Not Applicable  Comments: Check out: 1145   Dr. Kathie Dike is Medical Director for [facility] Pul[facility].

## 2020-02-22 ENCOUNTER — Telehealth: Payer: Self-pay | Admitting: Pulmonary Disease

## 2020-02-22 NOTE — Telephone Encounter (Signed)
Submitted PA for Spiriva Respimat, received a message that a PA was not necessary, it is covered under the plan.

## 2020-02-23 ENCOUNTER — Encounter (HOSPITAL_COMMUNITY)
Admission: RE | Admit: 2020-02-23 | Discharge: 2020-02-23 | Disposition: A | Discharge status: Home / Self Care | Payer: No Typology Code available for payment source | Source: Ambulatory Visit | Attending: Pulmonary Disease | Admitting: Pulmonary Disease

## 2020-02-23 ENCOUNTER — Other Ambulatory Visit: Payer: Self-pay

## 2020-02-23 VITALS — Wt 233.7 lb

## 2020-02-23 DIAGNOSIS — J410 Simple chronic bronchitis: Secondary | ICD-10-CM

## 2020-02-23 DIAGNOSIS — R0602 Shortness of breath: Secondary | ICD-10-CM

## 2020-02-23 NOTE — Progress Notes (Signed)
Daily Session Note  Patient Details  Name: Jane Bennett MRN: [id number] Date of Birth: 1966-08-22 Referring Provider:     PULMONARY REHAB OTHER RESP ORIENTATION from 11/16/2019 in [place]  Referring Provider  Dr. Loanne Drilling      Encounter Date: 02/23/2020  Check In: Session Check In - 02/23/20 1129      Check-In   Supervising physician immediately available to respond to emergencies  See telemetry face sheet for immediately available MD    Location  [facility]    Staff Present  [name], MS, EP, [facility], Exercise Physiologist;Other;Vaniece Hatchett, Exercise Physiologist    Virtual Visit  No    Medication changes reported      No    Fall or balance concerns reported     No    Tobacco Cessation  No Change    Warm-up and Cool-down  Performed as group-led instruction    Resistance Training Performed  Yes    VAD Patient?  No    PAD/SET Patient?  No      Pain Assessment   Currently in Pain?  No/denies    Pain Score  0-No pain    Multiple Pain Sites  No       Capillary Blood Glucose: No results found for this or any previous visit (from the past 24 hour(s)).    Social History   Tobacco Use  Smoking Status Never Smoker  Smokeless Tobacco Never Used    Goals Met:  Proper associated with RPD/PD & O2 Sat Independence with exercise equipment Improved SOB with ADL's Using PLB without cueing & demonstrates good technique Exercise tolerated well Personal goals reviewed No report of cardiac concerns or symptoms Strength training completed today  Goals Unmet:  Not Applicable  Comments: check out 11:45   Dr. Kathie Dike is Medical Director for [facility] Pulmonary Rehab.

## 2020-02-28 ENCOUNTER — Other Ambulatory Visit: Payer: Self-pay

## 2020-02-28 ENCOUNTER — Encounter (HOSPITAL_COMMUNITY): Admission: RE | Admit: 2020-02-28 | Payer: No Typology Code available for payment source | Source: Ambulatory Visit

## 2020-02-29 ENCOUNTER — Telehealth: Payer: Self-pay | Admitting: Pulmonary Disease

## 2020-02-29 NOTE — Telephone Encounter (Signed)
No PA needed for Spiriva, it is on list of covered meds.  [facility] pharmacy called and notified.  Verified with the pharmacy that the prescription went through without any problem.  This medication or product is on your plan's list of covered drugs. Prior authorization is not required at this time. If your pharmacy has questions regarding the processing of your prescription, please have them call the [facility] pharmacy help desk at [phone]. **Please note: Formulary lowering, tiering exception, cost reduction and/or pre-benefit determination review (including prospective Medicare hospice reviews) requests cannot be requested using this method of submission. Please contact us at [phone] instead.

## 2020-02-29 NOTE — Progress Notes (Signed)
Pulmonary Individual Treatment Plan  Patient Details  Name: Jane Bennett MRN: [id number] Date of Birth: 02-Aug-1966 Referring Provider:     PULMONARY REHAB OTHER RESP ORIENTATION from 11/16/2019 in [facility]  Referring Provider  Dr. Everardo All      Initial Encounter Date:    PULMONARY REHAB OTHER RESP ORIENTATION from 11/16/2019 in [facility]  Date  11/16/19      Visit Diagnosis: SOB (shortness of breath)  Simple chronic bronchitis (HCC)  Patient's Home Medications on Admission:   Current Outpatient Medications:  .  albuterol (VENTOLIN HFA) 108 (90 Base) MCG/ACT inhaler, INHALE 1 TO 2 PUFFS BY MOUTH EVERY 4 TO 6 HOURS, Disp: 18 g, Rfl: 3 .  cetirizine (ZYRTEC) 10 MG tablet, Take 1 tablet by mouth once daily, Disp: 30 tablet, Rfl: 5 .  famotidine (PEPCID) 20 MG tablet, Take 1 tablet (20 mg total) by mouth at bedtime., Disp: 30 tablet, Rfl: 1 .  fluticasone (FLONASE) 50 MCG/ACT nasal spray, Place 1 spray into both nostrils daily., Disp: 16 g, Rfl: 2 .  fluticasone-salmeterol (ADVAIR HFA) 230-21 MCG/ACT inhaler, Inhale 2 puffs into the lungs 2 (two) times daily., Disp: 1 Inhaler, Rfl: 12 .  losartan-hydrochlorothiazide (HYZAAR) 50-12.5 MG tablet, Take 1 tablet by mouth daily., Disp: , Rfl:  .  ondansetron (ZOFRAN-ODT) 8 MG disintegrating tablet, Take 8 mg by mouth every 6 (six) hours as needed., Disp: , Rfl:  .  predniSONE (DELTASONE) 10 MG tablet, Take 2 tablets (20 mg total) by mouth daily with breakfast for 14 days, THEN 1 tablet (10 mg total) daily with breakfast for 14 days., Disp: 42 tablet, Rfl: 0 .  rivaroxaban (XARELTO) 20 MG TABS tablet, Take 20 mg by mouth daily with supper., Disp: , Rfl:  .  Tiotropium Bromide Monohydrate (SPIRIVA RESPIMAT) 1.25 MCG/ACT AERS, Inhale 2 puffs into the lungs daily., Disp: 4 g, Rfl: 5  Past Medical History: Past Medical History:  Diagnosis Date  . DVT (deep venous thrombosis) (HCC)   . High  cholesterol   . Pneumonia     Tobacco Use: Social History   Tobacco Use  Smoking Status Never Smoker  Smokeless Tobacco Never Used    Labs: Recent Review Flowsheet Data    There is no flowsheet data to display.      Capillary Blood Glucose: No results found for: GLUCAP   Pulmonary Assessment Scores: Pulmonary Assessment Scores    Row Name 11/16/19 1117         ADL UCSD   ADL Phase  Entry     SOB Score total  90     Rest  0     Walk  11     Stairs  5     Bath  4     Dress  5     Shop  5       CAT Score   CAT Score  33       mMRC Score   mMRC Score  4       UCSD: Self-administered rating of dyspnea associated with activities of daily living (ADLs) 6-point scale (0 = "not at all" to 5 = "maximal or unable to do because of breathlessness")  Scoring Scores range from 0 to 120.  Minimally important difference is 5 units  CAT: CAT can identify the health impairment of COPD patients and is better correlated with disease progression.  CAT has a scoring range of zero to 40. The CAT score  is classified into four groups of low (less than 10), medium (10 - 20), high (21-30) and very high (31-40) based on the impact level of disease on health status. A CAT score over 10 suggests significant symptoms.  A worsening CAT score could be explained by an exacerbation, poor medication adherence, poor inhaler technique, or progression of COPD or comorbid conditions.  CAT MCID is 2 points  mMRC: mMRC (Modified Medical Research Council) Dyspnea Scale is used to assess the degree of baseline functional disability in patients of respiratory disease due to dyspnea. No minimal important difference is established. A decrease in score of 1 point or greater is considered a positive change.   Pulmonary Function Assessment:   Exercise Target Goals: Exercise Program Goal: Individual exercise prescription set using results from initial 6 min walk test and THRR while considering  patient's  activity barriers and safety.   Exercise Prescription Goal: Initial exercise prescription builds to 30-45 minutes a day of aerobic activity, 2-3 days per week.  Home exercise guidelines will be given to patient during program as part of exercise prescription that the participant will acknowledge.  Activity Barriers & Risk Stratification: Activity Barriers & Cardiac Risk Stratification - 11/16/19 1103      Activity Barriers & Cardiac Risk Stratification   Activity Barriers  Shortness of Breath    Cardiac Risk Stratification  Moderate       6 Minute Walk: 6 Minute Walk    Row Name 11/16/19 1058         6 Minute Walk   Phase  Initial     Distance  700 feet     Walk Time  6 minutes     # of Rest Breaks  1     MPH  1.32     METS  2.01     RPE  14     Perceived Dyspnea   16     VO2 Peak  0.56     Symptoms  Yes (comment)     Comments  Extreme SOB. Had to stop to rest for 2 minutes then was able to finish the test.     Resting HR  83 bpm     Resting BP  170/98     Resting Oxygen Saturation   97 %     Exercise Oxygen Saturation  during 6 min walk  96 %     Max Ex. HR  107 bpm     Max Ex. BP  198/120     2 Minute Post BP  158/98        Oxygen Initial Assessment: Oxygen Initial Assessment - 11/16/19 1116      Home Oxygen   Home Oxygen Device  None    Sleep Oxygen Prescription  None    Home Exercise Oxygen Prescription  None    Home at Rest Exercise Oxygen Prescription  None      Initial 6 min Walk   Oxygen Used  None      Program Oxygen Prescription   Program Oxygen Prescription  None       Oxygen Re-Evaluation: Oxygen Re-Evaluation    Row Name 11/24/19 1537 12/15/19 1525 01/13/20 1413 02/03/20 1528 02/29/20 1527     Program Oxygen Prescription   Program Oxygen Prescription  None  None  None  None  None     Home Oxygen   Home Oxygen Device  None  None  None  None  None   Sleep Oxygen Prescription  None  None  None  None  None   Home Exercise Oxygen  Prescription  None  None  None  None  None   Home at Rest Exercise Oxygen Prescription  None  None  None  None  None   Compliance with Home Oxygen Use  Yes  Yes  Yes  Yes  Yes     Goals/Expected Outcomes   Short Term Goals  To learn and exhibit compliance with exercise, home and travel O2 prescription;To learn and understand importance of monitoring SPO2 with pulse oximeter and demonstrate accurate use of the pulse oximeter.;To learn and understand importance of maintaining oxygen saturations>88%;To learn and demonstrate proper pursed lip breathing techniques or other breathing techniques.  To learn and exhibit compliance with exercise, home and travel O2 prescription;To learn and understand importance of monitoring SPO2 with pulse oximeter and demonstrate accurate use of the pulse oximeter.;To learn and understand importance of maintaining oxygen saturations>88%;To learn and demonstrate proper pursed lip breathing techniques or other breathing techniques.  To learn and exhibit compliance with exercise, home and travel O2 prescription;To learn and understand importance of monitoring SPO2 with pulse oximeter and demonstrate accurate use of the pulse oximeter.;To learn and understand importance of maintaining oxygen saturations>88%;To learn and demonstrate proper pursed lip breathing techniques or other breathing techniques.  To learn and exhibit compliance with exercise, home and travel O2 prescription;To learn and understand importance of monitoring SPO2 with pulse oximeter and demonstrate accurate use of the pulse oximeter.;To learn and understand importance of maintaining oxygen saturations>88%;To learn and demonstrate proper pursed lip breathing techniques or other breathing techniques.  To learn and exhibit compliance with exercise, home and travel O2 prescription;To learn and understand importance of monitoring SPO2 with pulse oximeter and demonstrate accurate use of the pulse oximeter.;To learn and  understand importance of maintaining oxygen saturations>88%;To learn and demonstrate proper pursed lip breathing techniques or other breathing techniques.   Long  Term Goals  Exhibits compliance with exercise, home and travel O2 prescription;Verbalizes importance of monitoring SPO2 with pulse oximeter and return demonstration;Maintenance of O2 saturations>88%;Exhibits proper breathing techniques, such as pursed lip breathing or other method taught during program session;Compliance with respiratory medication  Exhibits compliance with exercise, home and travel O2 prescription;Verbalizes importance of monitoring SPO2 with pulse oximeter and return demonstration;Maintenance of O2 saturations>88%;Exhibits proper breathing techniques, such as pursed lip breathing or other method taught during program session;Compliance with respiratory medication  Exhibits compliance with exercise, home and travel O2 prescription;Verbalizes importance of monitoring SPO2 with pulse oximeter and return demonstration;Maintenance of O2 saturations>88%;Exhibits proper breathing techniques, such as pursed lip breathing or other method taught during program session;Compliance with respiratory medication  Exhibits compliance with exercise, home and travel O2 prescription;Verbalizes importance of monitoring SPO2 with pulse oximeter and return demonstration;Maintenance of O2 saturations>88%;Exhibits proper breathing techniques, such as pursed lip breathing or other method taught during program session;Compliance with respiratory medication  Exhibits compliance with exercise, home and travel O2 prescription;Verbalizes importance of monitoring SPO2 with pulse oximeter and return demonstration;Maintenance of O2 saturations>88%;Exhibits proper breathing techniques, such as pursed lip breathing or other method taught during program session;Compliance with respiratory medication   Comments  Patient is able to verbalize the importance of  monitoring her SPO2 and maintaining her O2 saturation >88%. She is also demonstrate proper pursed lip breathing technique in class and is compliant with her exercise prescription and medications.  Patient is able to verbalize the importance of monitoring her SPO2 and maintaining her O2 saturation >88% and  to demonstrate proper usage of pulse oximeter. She is also able to demonstrate proper pursed lip breathing technique in class and is compliant with her exercise prescription and medications.  Patient is able to verbalize the importance of monitoring her SPO2 and maintaining her O2 saturation >88% and to demonstrate proper usage of pulse oximeter. She is also able to demonstrate proper pursed lip breathing technique in class and is compliant with her exercise prescription and medications.  Patient is able to verbalize the importance of monitoring her SPO2 and maintaining her O2 saturation >88% and to demonstrate proper usage of pulse oximeter. She is also able to demonstrate proper pursed lip breathing technique in class and is compliant with her exercise prescription and medications.  Patient is able to verbalize the importance of monitoring her SPO2 and maintaining her O2 saturation >88% and to demonstrate proper usage of pulse oximeter. She is also able to demonstrate proper pursed lip breathing technique in class and is compliant with her exercise prescription and medications.   Goals/Expected Outcomes  Patient is meeting her expected goals and outcomes. Will continue to monitor.  Patient is meeting her expected goals and outcomes. Will continue to monitor.  Patient is meeting her expected goals and outcomes. Will continue to monitor.  Patient is meeting her expected goals and outcomes. Will continue to monitor.  Patient is meeting her expected goals and outcomes. Will continue to monitor.      Oxygen Discharge (Final Oxygen Re-Evaluation): Oxygen Re-Evaluation - 02/29/20 1527      Program Oxygen  Prescription   Program Oxygen Prescription  None      Home Oxygen   Home Oxygen Device  None    Sleep Oxygen Prescription  None    Home Exercise Oxygen Prescription  None    Home at Rest Exercise Oxygen Prescription  None    Compliance with Home Oxygen Use  Yes      Goals/Expected Outcomes   Short Term Goals  To learn and exhibit compliance with exercise, home and travel O2 prescription;To learn and understand importance of monitoring SPO2 with pulse oximeter and demonstrate accurate use of the pulse oximeter.;To learn and understand importance of maintaining oxygen saturations>88%;To learn and demonstrate proper pursed lip breathing techniques or other breathing techniques.    Long  Term Goals  Exhibits compliance with exercise, home and travel O2 prescription;Verbalizes importance of monitoring SPO2 with pulse oximeter and return demonstration;Maintenance of O2 saturations>88%;Exhibits proper breathing techniques, such as pursed lip breathing or other method taught during program session;Compliance with respiratory medication    Comments  Patient is able to verbalize the importance of monitoring her SPO2 and maintaining her O2 saturation >88% and to demonstrate proper usage of pulse oximeter. She is also able to demonstrate proper pursed lip breathing technique in class and is compliant with her exercise prescription and medications.    Goals/Expected Outcomes  Patient is meeting her expected goals and outcomes. Will continue to monitor.       Initial Exercise Prescription: Initial Exercise Prescription - 11/16/19 1100      Date of Initial Exercise RX and Referring Provider   Date  11/16/19    Referring Provider  Dr. Everardo All    Expected Discharge Date  02/13/20      Arm Ergometer   Level  1    Watts  7    RPM  30    Minutes  17    METs  1.5      T5 Nustep  Level  1    SPM  49    Minutes  22    METs  1.8      Prescription Details   Frequency (times per week)  2     Duration  Progress to 30 minutes of continuous aerobic without signs/symptoms of physical distress      Intensity   THRR 40-80% of Max Heartrate  97-133-150    Ratings of Perceived Exertion  11-13    Perceived Dyspnea  0-4      Progression   Progression  Continue to progress workloads to maintain intensity without signs/symptoms of physical distress.      Resistance Training   Training Prescription  Yes    Weight  1    Reps  10-15       Perform Capillary Blood Glucose checks as needed.  Exercise Prescription Changes:  Exercise Prescription Changes    Row Name 12/16/19 1400 01/12/20 1800 02/06/20 1600 02/20/20 1400       Response to Exercise   Blood Pressure (Admit)  168/98  140/74  168/84  130/90    Blood Pressure (Exercise)  160/100  158/92  140/88  158/78    Blood Pressure (Exit)  150/92  120/86  126/82  770/70    Heart Rate (Admit)  104 bpm  88 bpm  92 bpm  86 bpm    Heart Rate (Exercise)  102 bpm  102 bpm  95 bpm  106 bpm    Heart Rate (Exit)  96 bpm  99 bpm  97 bpm  98 bpm    Oxygen Saturation (Admit)  96 %  98 %  97 %  97 %    Oxygen Saturation (Exercise)  96 %  93 %  94 %  97 %    Oxygen Saturation (Exit)  96 %  94 %  97 %  97 %    Rating of Perceived Exertion (Exercise)  12  12  12  12     Perceived Dyspnea (Exercise)  12  12  12  12     Symptoms  extreme SOB  extreme SOB  extreme SOB  extreme SOB    Duration  Continue with 30 min of aerobic exercise without signs/symptoms of physical distress.  Continue with 30 min of aerobic exercise without signs/symptoms of physical distress.  Continue with 30 min of aerobic exercise without signs/symptoms of physical distress.  Continue with 30 min of aerobic exercise without signs/symptoms of physical distress.    Intensity  THRR unchanged  THRR unchanged  THRR unchanged  THRR unchanged      Progression   Progression  Continue to progress workloads to maintain intensity without signs/symptoms of physical distress.  Continue to  progress workloads to maintain intensity without signs/symptoms of physical distress.  Continue to progress workloads to maintain intensity without signs/symptoms of physical distress.  Continue to progress workloads to maintain intensity without signs/symptoms of physical distress.    Average METs  1.8  1.95  2.3  2.5      Resistance Training   Training Prescription  Yes  Yes  Yes  Yes    Weight  2  3  4   --    Reps  10-15  10-15  10-15  10-15    Time  10 Minutes  10 Minutes  10 Minutes  10 Minutes      Oxygen   Liters  --  --  --  0      Arm Ergometer  Level  1.2  1.7  1.9  2.1    Watts  13  14  19  20     RPM  51  55  54  52    Minutes  17  17  17  17     METs  1.8  1.9  2.3  2.3      T5 Nustep   Level  1  2  2  2     SPM  88  100  110  125    Minutes  22  22  22  22     METs  1.8  2  2.3  2.7      Home Exercise Plan   Plans to continue exercise at  Home (comment)  Home (comment)  Home (comment)  Home (comment)    Frequency  Add 3 additional days to program exercise sessions.  Add 3 additional days to program exercise sessions.  Add 3 additional days to program exercise sessions.  Add 3 additional days to program exercise sessions.    Initial Home Exercises Provided  11/16/19  11/16/19  11/16/19  11/16/19       Exercise Comments:  Exercise Comments    Row Name 11/16/19 1109 11/24/19 1503 12/16/19 1437 01/12/20 1810 02/06/20 1636   Exercise Comments  Today was patients inital assessment/orientation. She did well considering her residual effects of SOB and Wheezing post COVID-19. She is eager and wants to attend so that she can improve and decrease her SOB.  Will continue to progress as tolerated on exercise machines and weights.  Patient has completed 9 sessions. She is still dealing with the residuals of having COVID-19. She has a very difficult time breathing. Inspite of this she still comes consistently and tries very hard on each machine. Will continue to progress her workloads  and weights as tolerated.  Jane Bennett has been consistent with her attendance. She tries very hard with her exercise and progressions in spite of her difficulty breathing. She says that coming to PR is making her stronger. We will continue to progress as tolerated.  Jane Bennett has been consistent with her attendance. She tries very hard with her exercise and progressions in spite of her difficulty breathing. She says that coming to PR is making her stronger. We will continue to progress as tolerated.   Row Name 02/20/20 1505           Exercise Comments  Patient has excellent attendance. Even when she is struggling to breathe she show up to class ready to give her 100%. She says that this is helping her to feel better and she is able to do more of her daily activites. We will continue to progress as tolerated.          Exercise Goals and Review:  Exercise Goals    Row Name 11/16/19 1108             Exercise Goals   Increase Physical Activity  Yes       Intervention  Provide advice, education, support and counseling about physical activity/exercise needs.;Develop an individualized exercise prescription for aerobic and resistive training based on initial evaluation findings, risk stratification, comorbidities and participant's personal goals.       Expected Outcomes  Short Term: Attend rehab on a regular basis to increase amount of physical activity.;Long Term: Add in home exercise to make exercise part of routine and to increase amount of physical activity.       Increase Strength and Stamina  Yes       Intervention  Provide advice, education, support and counseling about physical activity/exercise needs.;Develop an individualized exercise prescription for aerobic and resistive training based on initial evaluation findings, risk stratification, comorbidities and participant's personal goals.       Expected Outcomes  Short Term: Perform resistance training exercises routinely during rehab and add in  resistance training at home;Long Term: Improve cardiorespiratory fitness, muscular endurance and strength as measured by increased METs and functional capacity (6MWT)       Able to understand and use rate of perceived exertion (RPE) scale  Yes       Intervention  Provide education and explanation on how to use RPE scale       Expected Outcomes  Short Term: Able to use RPE daily in rehab to express subjective intensity level;Long Term:  Able to use RPE to guide intensity level when exercising independently       Able to understand and use Dyspnea scale  Yes       Intervention  Provide education and explanation on how to use Dyspnea scale       Expected Outcomes  Short Term: Able to use Dyspnea scale daily in rehab to express subjective sense of shortness of breath during exertion;Long Term: Able to use Dyspnea scale to guide intensity level when exercising independently       Knowledge and understanding of Target Heart Rate Range (THRR)  Yes       Intervention  Provide education and explanation of THRR including how the numbers were predicted and where they are located for reference       Expected Outcomes  Short Term: Able to use daily as guideline for intensity in rehab;Long Term: Able to use THRR to govern intensity when exercising independently       Able to check pulse independently  Yes       Intervention  Provide education and demonstration on how to check pulse in carotid and radial arteries.;Review the importance of being able to check your own pulse for safety during independent exercise       Expected Outcomes  Short Term: Able to explain why pulse checking is important during independent exercise;Long Term: Able to check pulse independently and accurately       Understanding of Exercise Prescription  Yes       Intervention  Provide education, explanation, and written materials on patient's individual exercise prescription       Expected Outcomes  Short Term: Able to explain program exercise  prescription;Long Term: Able to explain home exercise prescription to exercise independently          Exercise Goals Re-Evaluation : Exercise Goals Re-Evaluation    Row Name 11/24/19 1501 12/16/19 1436 01/12/20 1809 02/06/20 1634 02/20/20 1502     Exercise Goal Re-Evaluation   Exercise Goals Review  Increase Physical Activity;Increase Strength and Stamina  Increase Physical Activity;Increase Strength and Stamina  Increase Physical Activity;Increase Strength and Stamina  Increase Physical Activity;Increase Strength and Stamina  Increase Physical Activity;Increase Strength and Stamina   Comments  Patient has just started the program. This is her 3rd visit. We will continue to monitor her progress.  Patient goals are to breathe better and to get back to normal ADL's.  Her goals are to breathe better, get back to her daily life which includes going back to work.  Patient wants to get back to normal ADLs and her daily life. She also wants to breathe better.  Patient  goals are to breathe better, to get back to her normal ADLs and daily life. Jane Bennett has done well despite her having a really hard time breathing. She will push through how she is feeling anyway and finish all of the machines. We will continue to monitor her progress.   Expected Outcomes  Reach her expected goals of breathing better and getting back to normal ADL's. she also wants to get back to work.  Reach her personal goals  Reach her goals  To reach expected goals  To readh her expected goals.      Discharge Exercise Prescription (Final Exercise Prescription Changes): Exercise Prescription Changes - 02/20/20 1400      Response to Exercise   Blood Pressure (Admit)  130/90    Blood Pressure (Exercise)  158/78    Blood Pressure (Exit)  770/70    Heart Rate (Admit)  86 bpm    Heart Rate (Exercise)  106 bpm    Heart Rate (Exit)  98 bpm    Oxygen Saturation (Admit)  97 %    Oxygen Saturation (Exercise)  97 %    Oxygen Saturation (Exit)   97 %    Rating of Perceived Exertion (Exercise)  12    Perceived Dyspnea (Exercise)  12    Symptoms  extreme SOB    Duration  Continue with 30 min of aerobic exercise without signs/symptoms of physical distress.    Intensity  THRR unchanged      Progression   Progression  Continue to progress workloads to maintain intensity without signs/symptoms of physical distress.    Average METs  2.5      Resistance Training   Training Prescription  Yes    Reps  10-15    Time  10 Minutes      Oxygen   Liters  0      Arm Ergometer   Level  2.1    Watts  20    RPM  52    Minutes  17    METs  2.3      T5 Nustep   Level  2    SPM  125    Minutes  22    METs  2.7      Home Exercise Plan   Plans to continue exercise at  Home (comment)    Frequency  Add 3 additional days to program exercise sessions.    Initial Home Exercises Provided  11/16/19       Nutrition:  Target Goals: Understanding of nutrition guidelines, daily intake of sodium 1500mg , cholesterol 200mg , calories 30% from fat and 7% or less from saturated fats, daily to have 5 or more servings of fruits and vegetables.  Biometrics: Pre Biometrics - 11/16/19 1111      Pre Biometrics   Height  5\' 2"  (1.575 m)    Weight  101.8 kg    Waist Circumference  44 inches    Hip Circumference  45.5 inches    Waist to Hip Ratio  0.97 %    BMI (Calculated)  41.05    Triceps Skinfold  26 mm    % Body Fat  48.4 %    Grip Strength  20.8 kg    Flexibility  7.6 in    Single Leg Stand  25 seconds        Nutrition Therapy Plan and Nutrition Goals: Nutrition Therapy & Goals - 02/29/20 1528      Personal Nutrition Goals   Comments  We continue  to work with patient and RD to schedule RD classes. Her medificts diet assessment score was 33. She continues to say she is trying to eat healthy. Will continue to monitor for progress.      Intervention Plan   Intervention  Nutrition handout(s) given to patient.       Nutrition  Assessments: Nutrition Assessments - 11/16/19 1124      MEDFICTS Scores   Pre Score  33       Nutrition Goals Re-Evaluation:   Nutrition Goals Discharge (Final Nutrition Goals Re-Evaluation):   Psychosocial: Target Goals: Acknowledge presence or absence of significant depression and/or stress, maximize coping skills, provide positive support system. Participant is able to verbalize types and ability to use techniques and skills needed for reducing stress and depression.  Initial Review & Psychosocial Screening: Initial Psych Review & Screening - 11/16/19 1120      Initial Review   Current issues with  Current Anxiety/Panic   Due to not being able to breath post COVID-19     Family Dynamics   Good Support System?  Yes      Barriers   Psychosocial barriers to participate in program  There are no identifiable barriers or psychosocial needs.      Screening Interventions   Interventions  Encouraged to exercise       Quality of Life Scores: Quality of Life - 11/16/19 1121      Quality of Life   Select  Quality of Life      Quality of Life Scores   Health/Function Pre  20.88 %    Socioeconomic Pre  20.25 %    Psych/Spiritual Pre  20.86 %    Family Pre  20.8 %    GLOBAL Pre  20.72 %      Scores of 19 and below usually indicate a poorer quality of life in these areas.  A difference of  2-3 points is a clinically meaningful difference.  A difference of 2-3 points in the total score of the Quality of Life Index has been associated with significant improvement in overall quality of life, self-image, physical symptoms, and general health in studies assessing change in quality of life.   PHQ-9: Recent Review Flowsheet Data    Depression screen [facility] 2/9 11/16/2019   Decreased Interest 0   Down, Depressed, Hopeless 0   PHQ - 2 Score 0   Altered sleeping 1   Tired, decreased energy 2   Change in appetite 1   Feeling bad or failure about yourself  0   Trouble concentrating 0    Moving slowly or fidgety/restless 0   Suicidal thoughts 0   PHQ-9 Score 4   Difficult doing work/chores Somewhat difficult     Interpretation of Total Score  Total Score Depression Severity:  1-4 = Minimal depression, 5-9 = Mild depression, 10-14 = Moderate depression, 15-19 = Moderately severe depression, 20-27 = Severe depression   Psychosocial Evaluation and Intervention: Psychosocial Evaluation - 11/16/19 1121      Psychosocial Evaluation & Interventions   Interventions  Encouraged to exercise with the program and follow exercise prescription    Continue Psychosocial Services   No Follow up required       Psychosocial Re-Evaluation: Psychosocial Re-Evaluation    Row Name 11/24/19 1541 12/15/19 1529 01/13/20 1416 02/03/20 1528 02/29/20 1528     Psychosocial Re-Evaluation   Current issues with  Current Anxiety/Panic  Current Anxiety/Panic  Current Anxiety/Panic  Current Anxiety/Panic  None Identified  Comments  Patient's initial QOL score was 20.72 and her PHQ-9 score was 4. She does report anxiety related to her SOB. She is currently not being treated for her anxiety. Will continue to monitor.  Patient's initial QOL score was 20.72 and her PHQ-9 score was 4. She does report anxiety related to her SOB. She is currently not being treated for her anxiety. Will continue to monitor.  Patient's initial QOL score was 20.72 and her PHQ-9 score was 4. She does report anxiety related to her SOB. She is currently not being treated for her anxiety. Will continue to monitor.  Patient's initial QOL score was 20.72 and her PHQ-9 score was 4. She does report anxiety related to her SOB. She is currently not being treated for her anxiety. Will continue to monitor.  Patient has no diagnosis of anxiety but states she was having some anxiety associated with her SOB. This is improving without treatment. Will continue to monitor.   Expected Outcomes  Patient will have no additional psychosocial issues  identified at discharge.  Patient will have no additional psychosocial issues identified at discharge.  Patient will have no additional psychosocial issues identified at discharge.  Patient will have no additional psychosocial issues identified at discharge.  Patient will have no additional psychosocial issues identified at discharge.   Interventions  Relaxation education;Encouraged to attend Pulmonary Rehabilitation for the exercise;Stress management education  Relaxation education;Encouraged to attend Pulmonary Rehabilitation for the exercise;Stress management education  Relaxation education;Encouraged to attend Pulmonary Rehabilitation for the exercise;Stress management education  Relaxation education;Encouraged to attend Pulmonary Rehabilitation for the exercise;Stress management education  Relaxation education;Encouraged to attend Pulmonary Rehabilitation for the exercise;Stress management education   Continue Psychosocial Services   Follow up required by staff  Follow up required by staff  No Follow up required  No Follow up required  No Follow up required      Psychosocial Discharge (Final Psychosocial Re-Evaluation): Psychosocial Re-Evaluation - 02/29/20 1528      Psychosocial Re-Evaluation   Current issues with  None Identified    Comments  Patient has no diagnosis of anxiety but states she was having some anxiety associated with her SOB. This is improving without treatment. Will continue to monitor.    Expected Outcomes  Patient will have no additional psychosocial issues identified at discharge.    Interventions  Relaxation education;Encouraged to attend Pulmonary Rehabilitation for the exercise;Stress management education    Continue Psychosocial Services   No Follow up required        Education: Education Goals: Education classes will be provided on a weekly basis, covering required topics. Participant will state understanding/return demonstration of topics presented.  Learning  Barriers/Preferences: Learning Barriers/Preferences - 11/16/19 1004      Learning Barriers/Preferences   Learning Barriers  None    Learning Preferences  Pictoral;Group Instruction;Video;Individual Instruction;Skilled Demonstration       Education Topics: How Lungs Work and Diseases: - Discuss the anatomy of the lungs and diseases that can affect the lungs, such as COPD.   PULMONARY REHAB OTHER RESPIRATORY from 02/16/2020 in [facility]  Date  12/29/19  Educator  D. Coad  Instruction Review Code  2- Demonstrated Understanding      Exercise: -Discuss the importance of exercise, FITT principles of exercise, normal and abnormal responses to exercise, and how to exercise safely.   Environmental Irritants: -Discuss types of environmental irritants and how to limit exposure to environmental irritants.   Meds/Inhalers and oxygen: - Discuss respiratory medications, definition  of an inhaler and oxygen, and the proper way to use an inhaler and oxygen.   PULMONARY REHAB OTHER RESPIRATORY from 02/16/2020 in [facility]  Date  01/12/20  Educator  DC      Energy Saving Techniques: - Discuss methods to conserve energy and decrease shortness of breath when performing activities of daily living.    Bronchial Hygiene / Breathing Techniques: - Discuss breathing mechanics, pursed-lip breathing technique,  proper posture, effective ways to clear airways, and other functional breathing techniques   Cleaning Equipment: - Provides group verbal and written instruction about the health risks of elevated stress, cause of high stress, and healthy ways to reduce stress.   PULMONARY REHAB OTHER RESPIRATORY from 02/16/2020 in [place] [facility]  Date  01/26/20  Educator  DC  Instruction Review Code  2- Demonstrated Understanding      Nutrition I: Fats: - Discuss the types of cholesterol, what cholesterol does to the body, and how  cholesterol levels can be controlled.   PULMONARY REHAB OTHER RESPIRATORY from 02/16/2020 in [facility] [facility]  Date  02/09/20  Educator  VH  Instruction Review Code  2- Demonstrated Understanding      Nutrition II: Labels: -Discuss the different components of food labels and how to read food labels.   PULMONARY REHAB OTHER RESPIRATORY from 02/16/2020 in [facility]  Date  02/16/20  Educator  DC  Instruction Review Code  2- Demonstrated Understanding      Respiratory Infections: - Discuss the signs and symptoms of respiratory infections, ways to prevent respiratory infections, and the importance of seeking medical treatment when having a respiratory infection.   PULMONARY REHAB OTHER RESPIRATORY from 02/16/2020 in [place] [facility]  Date  11/24/19  Educator  Timoteo Expose  Instruction Review Code  2- Demonstrated Understanding      Stress I: Signs and Symptoms: - Discuss the causes of stress, how stress may lead to anxiety and depression, and ways to limit stress.   PULMONARY REHAB OTHER RESPIRATORY from 02/16/2020 in [place] [facility]  Date  12/01/19  Educator  DC  Instruction Review Code  2- Demonstrated Understanding      Stress II: Relaxation: -Discuss relaxation techniques to limit stress.   PULMONARY REHAB OTHER RESPIRATORY from 02/16/2020 in [facility] [place] [facility]  Date  12/08/19  Educator  Timoteo Expose  Instruction Review Code  2- Demonstrated Understanding      Oxygen for Home/Travel: - Discuss how to prepare for travel when on oxygen and proper ways to transport and store oxygen to ensure safety.   PULMONARY REHAB OTHER RESPIRATORY from 02/16/2020 in [place] [facility]  Date  12/15/19  Educator  Timoteo Expose  Instruction Review Code  2- Demonstrated Understanding      Knowledge Questionnaire Score: Knowledge Questionnaire Score - 11/16/19 1005      Knowledge  Questionnaire Score   Pre Score  15/18       Core Components/Risk Factors/Patient Goals at Admission: Personal Goals and Risk Factors at Admission - 11/16/19 1125      Core Components/Risk Factors/Patient Goals on Admission    Weight Management  Weight Maintenance    Personal Goal Other  Yes    Personal Goal  Breath better, Be able to get back to normal ADL's, Get back to her daily life and to be able to return to work.    Intervention  Attend program 2 x week and to supplement with 3  x week home exercise plan.    Expected Outcomes  Reach expected goals.       Core Components/Risk Factors/Patient Goals Review:  Goals and Risk Factor Review    Row Name 11/24/19 1539 12/15/19 1526 01/13/20 1413 02/03/20 1528 02/29/20 1531     Core Components/Risk Factors/Patient Goals Review   Personal Goals Review  Weight Management/Obesity Get back to ADL's; and get back to her daily life. Breathe better.  Weight Management/Obesity Get bacl to ADL's; Get back to her daily life; breathe better.  Weight Management/Obesity Get back to ADL's; Get back to her daily life; breathe better.  Weight Management/Obesity Get back to ADL's; and get back to her daily life; breathe better.  Weight Management/Obesity Get back to ADL's and her daily life; breathe better.   Review  Patient is new to the program. She has completed 3 sessions. She does say however that she is doing more at home since she has been attending the program because she is not as afraid to do more. Will continue to monitor for progress.  Patient has completed 9 sessions gaining 3 llbs since she started the program. She is doing well in the program with some progression. She says she has good days and bad days with her breathing. She reports recently being exposed to yard mowing which has exacerbated her SOB and wheezing. She had to use her MDI during the session today. She still however feels like she is making progress in the program having more "good"  days and attempting to do more at home. Will continue to monitor for progress.  Patient has completed 16 sessions gaining 4 lbs since last 30 day review. She continues to do well in the program with progression. She reports doing more at home on the days she feels stronger. She feels like the program has given her more strength. Her SOB has improved. She is able to walk from her car in the parking lot into the building to the gym now without having to stop and rest of getting extremely SOB. Will continue to monitor for progress.  Patient has completed 22 sessions gaining 1 lb since last 30 day review. She continues to do well in the program with progression. She says she continues to do more at home on the days she feels stronger. She continues to have her days of increased SOB and fatigue. She continues to improve on her ability to walk into the building and to the gym without having to stop. She is doing well on her machines. She feels she is making progress. Will continue to monitor for progress.  Patient has completed 28 sessions gaining 1 lb since last 30 day review. She continues to do well in the program with progression. She continues to try and do more at home but still has days of increased SOB and fatigue. She is doing well when she exercises and is able to walk into the building and into the department without having to stop and rest. She still feels like she is making progress in the program. Will continue to monitor for progress.   Expected Outcomes  Patient will continue to attend sessions and complete the program meeting her personal goals.  Patient will continue to attend sessions and complete the program meeting her personal goals.  Patient will continue to attend sessions and complete the program meeting both personal and program goals.  Patient will continue to attend sessions and complete the program meeting both personal and program goals.  Patient will continue to attend sessions and complete  the program meeting both personal and program goals.      Core Components/Risk Factors/Patient Goals at Discharge (Final Review):  Goals and Risk Factor Review - 02/29/20 1531      Core Components/Risk Factors/Patient Goals Review   Personal Goals Review  Weight Management/Obesity   Get back to ADL's and her daily life; breathe better.   Review  Patient has completed 28 sessions gaining 1 lb since last 30 day review. She continues to do well in the program with progression. She continues to try and do more at home but still has days of increased SOB and fatigue. She is doing well when she exercises and is able to walk into the building and into the department without having to stop and rest. She still feels like she is making progress in the program. Will continue to monitor for progress.    Expected Outcomes  Patient will continue to attend sessions and complete the program meeting both personal and program goals.       ITP Comments: ITP Comments    Row Name 11/16/19 1027           ITP Comments  Patient is coming to use post COVID-19 with pneumonia which has left her extremely SOB on exertion. Patient is eager to get started.          Comments: ITP REVIEW Pt is making expected progress toward pulmonary rehab goals after completing 28 sessions. Recommend continued exercise, life style modification, education, and utilization of breathing techniques to increase stamina and strength and decrease shortness of breath with exertion.

## 2020-03-01 ENCOUNTER — Encounter (HOSPITAL_COMMUNITY): Payer: No Typology Code available for payment source

## 2020-03-06 ENCOUNTER — Other Ambulatory Visit: Payer: Self-pay

## 2020-03-06 ENCOUNTER — Encounter (HOSPITAL_COMMUNITY)
Admission: RE | Admit: 2020-03-06 | Discharge: 2020-03-06 | Disposition: A | Discharge status: Home / Self Care | Payer: No Typology Code available for payment source | Source: Ambulatory Visit | Attending: Pulmonary Disease | Admitting: Pulmonary Disease

## 2020-03-06 DIAGNOSIS — J410 Simple chronic bronchitis: Secondary | ICD-10-CM

## 2020-03-06 DIAGNOSIS — R0602 Shortness of breath: Secondary | ICD-10-CM

## 2020-03-06 NOTE — Progress Notes (Signed)
Daily Session Note  Patient Details  Name: Jane Bennett MRN: [id number] Date of Birth: December 13, 1965 Referring Provider:     PULMONARY REHAB OTHER RESP ORIENTATION from 11/16/2019 in [place]  Referring Provider Dr. Loanne Drilling      Encounter Date: 03/06/2020  Check In:  Session Check In - 03/06/20 1045      Check-In   Supervising physician immediately available to respond to emergencies See telemetry face sheet for immediately available MD    Location [facility]    Staff Present Algis Downs, Exercise Physiologist;Other   Geanie Cooley RN   Virtual Visit No    Medication changes reported     No    Fall or balance concerns reported    No    Tobacco Cessation No Change    Warm-up and Cool-down Performed as group-led instruction    Resistance Training Performed Yes    VAD Patient? No    PAD/SET Patient? No      Pain Assessment   Currently in Pain? No/denies    Pain Score 0-No pain    Multiple Pain Sites No           Capillary Blood Glucose: No results found for this or any previous visit (from the past 24 hour(s)).    Social History   Tobacco Use  Smoking Status Never Smoker  Smokeless Tobacco Never Used    Goals Met:  Proper associated with RPD/PD & O2 Sat Independence with exercise equipment Improved SOB with ADL's Exercise tolerated well Personal goals reviewed No report of cardiac concerns or symptoms Strength training completed today  Goals Unmet:  Not Applicable  Comments: check out 11:45   Dr. Kathie Dike is Medical Director for [facility] Pulmonary Rehab.

## 2020-03-08 ENCOUNTER — Encounter (HOSPITAL_COMMUNITY)
Admission: RE | Admit: 2020-03-08 | Discharge: 2020-03-08 | Disposition: A | Discharge status: Home / Self Care | Payer: No Typology Code available for payment source | Source: Ambulatory Visit | Attending: Pulmonary Disease | Admitting: Pulmonary Disease

## 2020-03-08 ENCOUNTER — Other Ambulatory Visit: Payer: Self-pay

## 2020-03-08 DIAGNOSIS — J410 Simple chronic bronchitis: Secondary | ICD-10-CM

## 2020-03-08 DIAGNOSIS — R0602 Shortness of breath: Secondary | ICD-10-CM

## 2020-03-08 NOTE — Progress Notes (Signed)
Daily Session Note  Patient Details  Name: Jane Bennett MRN: [id number] Date of Birth: 06-Mar-1966 Referring Provider:     PULMONARY REHAB OTHER RESP ORIENTATION from 11/16/2019 in [place]  Referring Provider Dr. Loanne Drilling      Encounter Date: 03/08/2020  Check In:  Session Check In - 03/08/20 1108      Check-In   Supervising physician immediately available to respond to emergencies See telemetry face sheet for immediately available MD    Location [facility]    Staff Present Algis Downs, Exercise Physiologist;Tyrisha Benninger Kris Mouton, MS, ACSM-CEP, Exercise Physiologist;Debra [name], RN, BSN    Virtual Visit No    Medication changes reported     No    Fall or balance concerns reported    No    Tobacco Cessation No Change    Warm-up and Cool-down Performed as group-led instruction    Resistance Training Performed Yes    VAD Patient? No    PAD/SET Patient? No      Pain Assessment   Currently in Pain? No/denies    Pain Score 0-No pain    Multiple Pain Sites No           Capillary Blood Glucose: No results found for this or any previous visit (from the past 24 hour(s)).    Social History   Tobacco Use  Smoking Status Never Smoker  Smokeless Tobacco Never Used    Goals Met:  Independence with exercise equipment Exercise tolerated well Strength training completed today  Goals Unmet:  Not Applicable  Comments: checkout time is 1145   Dr. Kate Sable is Medical Director for [place] and Pulmonary Rehab.

## 2020-03-13 ENCOUNTER — Telehealth: Payer: Self-pay | Admitting: Pulmonary Disease

## 2020-03-13 ENCOUNTER — Encounter (HOSPITAL_COMMUNITY)
Admission: RE | Admit: 2020-03-13 | Discharge: 2020-03-13 | Disposition: A | Discharge status: Home / Self Care | Payer: No Typology Code available for payment source | Source: Ambulatory Visit | Attending: Pulmonary Disease | Admitting: Pulmonary Disease

## 2020-03-13 ENCOUNTER — Other Ambulatory Visit: Payer: Self-pay

## 2020-03-13 DIAGNOSIS — R0602 Shortness of breath: Secondary | ICD-10-CM | POA: Diagnosis not present

## 2020-03-13 NOTE — Progress Notes (Signed)
Daily Session Note  Patient Details  Name: Jane Bennett MRN: [id number] Date of Birth: 1966/03/31 Referring Provider:     PULMONARY REHAB OTHER RESP ORIENTATION from 11/16/2019 in [place]  Referring Provider Dr. Loanne Drilling      Encounter Date: 03/13/2020  Check In:  Session Check In - 03/13/20 1045      Check-In   Supervising physician immediately available to respond to emergencies See telemetry face sheet for immediately available MD    Location [facility]    Staff Present Geanie Cooley, RN;Vaniece Hatchett, Exercise Physiologist;Annedrea Rosezella [place], RN, [place]    Virtual Visit No    Medication changes reported     No    Fall or balance concerns reported    No    Tobacco Cessation No Change    Warm-up and Cool-down Performed as group-led instruction    Resistance Training Performed Yes    VAD Patient? No    PAD/SET Patient? No      Pain Assessment   Currently in Pain? No/denies    Pain Score 0-No pain    Multiple Pain Sites No           Capillary Blood Glucose: No results found for this or any previous visit (from the past 24 hour(s)).    Social History   Tobacco Use  Smoking Status Never Smoker  Smokeless Tobacco Never Used    Goals Met:  Proper associated with RPD/PD & O2 Sat Independence with exercise equipment Improved SOB with ADL's Using PLB without cueing & demonstrates good technique Exercise tolerated well Personal goals reviewed No report of cardiac concerns or symptoms Strength training completed today  Goals Unmet:  Not Applicable  Comments: check out 11:45   Dr. Kathie Dike is Medical Director for [facility] Pulmonary Rehab.

## 2020-03-13 NOTE — Telephone Encounter (Signed)
Called and spoke with the pharmacist at the [facility] in [place] regarding the patient's Advair that does not require a PA.  Verified the patient picked it up on 02/25/2020.

## 2020-03-15 ENCOUNTER — Encounter (HOSPITAL_COMMUNITY)
Admission: RE | Admit: 2020-03-15 | Discharge: 2020-03-15 | Disposition: A | Discharge status: Home / Self Care | Payer: No Typology Code available for payment source | Source: Ambulatory Visit | Attending: Pulmonary Disease | Admitting: Pulmonary Disease

## 2020-03-15 ENCOUNTER — Other Ambulatory Visit: Payer: Self-pay

## 2020-03-15 VITALS — Wt 235.5 lb

## 2020-03-15 DIAGNOSIS — J410 Simple chronic bronchitis: Secondary | ICD-10-CM

## 2020-03-15 DIAGNOSIS — R0602 Shortness of breath: Secondary | ICD-10-CM | POA: Diagnosis not present

## 2020-03-15 NOTE — Progress Notes (Signed)
Daily Session Note  Patient Details  Name: Jane Bennett MRN: [id number] Date of Birth: 1965-10-10 Referring Provider:     PULMONARY REHAB OTHER RESP ORIENTATION from 11/16/2019 in [place]  Referring Provider Dr. Loanne Drilling      Encounter Date: 03/15/2020  Check In:  Session Check In - 03/15/20 1045      Check-In   Supervising physician immediately available to respond to emergencies See telemetry face sheet for immediately available MD    Location [facility]    Staff Present Algis Downs, Exercise Physiologist;Debra [name], RN, BSN    Virtual Visit No    Medication changes reported     No    Fall or balance concerns reported    No    Tobacco Cessation No Change    Warm-up and Cool-down Performed as group-led instruction    Resistance Training Performed Yes    VAD Patient? No    PAD/SET Patient? No      Pain Assessment   Currently in Pain? No/denies    Pain Score 0-No pain    Multiple Pain Sites No           Capillary Blood Glucose: No results found for this or any previous visit (from the past 24 hour(s)).    Social History   Tobacco Use  Smoking Status Never Smoker  Smokeless Tobacco Never Used    Goals Met:  Proper associated with RPD/PD & O2 Sat Independence with exercise equipment Improved SOB with ADL's Using PLB without cueing & demonstrates good technique Exercise tolerated well No report of cardiac concerns or symptoms Strength training completed today  Goals Unmet:  Not Applicable  Comments: Check out: 1145   Dr. Kathie Dike is Medical Director for [facility] Pulmonary Rehab.

## 2020-03-16 ENCOUNTER — Ambulatory Visit (INDEPENDENT_AMBULATORY_CARE_PROVIDER_SITE_OTHER): Payer: No Typology Code available for payment source | Admitting: Primary Care

## 2020-03-16 ENCOUNTER — Encounter: Payer: Self-pay | Admitting: Primary Care

## 2020-03-16 DIAGNOSIS — J383 Other diseases of vocal cords: Secondary | ICD-10-CM

## 2020-03-16 DIAGNOSIS — J4542 Moderate persistent asthma with status asthmaticus: Secondary | ICD-10-CM

## 2020-03-16 DIAGNOSIS — Z8616 Personal history of COVID-19: Secondary | ICD-10-CM | POA: Diagnosis not present

## 2020-03-16 DIAGNOSIS — K219 Gastro-esophageal reflux disease without esophagitis: Secondary | ICD-10-CM

## 2020-03-16 DIAGNOSIS — J302 Other seasonal allergic rhinitis: Secondary | ICD-10-CM

## 2020-03-16 DIAGNOSIS — J849 Interstitial pulmonary disease, unspecified: Secondary | ICD-10-CM

## 2020-03-16 MED ORDER — PREDNISONE 5 MG PO TABS: 5.0000 mg | ORAL_TABLET | Freq: Every day | ORAL | 0 refills | Status: DC

## 2020-03-16 MED ORDER — OMEPRAZOLE 20 MG PO CPDR: 20.0000 mg | DELAYED_RELEASE_CAPSULE | Freq: Every day | ORAL | 2 refills | Status: DC

## 2020-03-16 NOTE — Assessment & Plan Note (Addendum)
-   Patient saw ENT in March 2021 at [facility] with Dr. Jenne Pane. 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 This condition was previously referred to as the vocal cord dysfunction (VCD) syndrome. PVCM has been associated with psychosocial disorders, stress, exercise, perioperative airway and neurologic injury, gastroesophageal reflux, and irritant inhalational exposures"   [email or web address]

## 2020-03-16 NOTE — Assessment & Plan Note (Addendum)
-   Some improvement in upper airway wheezing since adding Famotidine. 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 in July

## 2020-03-16 NOTE — Patient Instructions (Addendum)
Recommendations: Continue Famotidine 20mg  at bedtime Continue Zyrtec once daily Continue Advair one puff twice daily; Spiriva 2 puffs once daily  Continue Albuterol rescue inhaler every 4-6 hours for shortness of breath or wheezing  Taper prednisone to 5mg  once daily until your visit with Dr. in July  Add omeprazole 20mg  in the morning  Rx: Prednisone 5mg  daily Omeprazole 20mg  daily   Orders: HRCT re: evaluate for post covid-fibrosis Isaiah Serge August penn)  Follow-up  July 13th with Dr. as scheduled

## 2020-03-16 NOTE — Assessment & Plan Note (Signed)
-   Continue Flonase nasal spray and Zyrtec once daily

## 2020-03-16 NOTE — Progress Notes (Signed)
@Patient  ID: Jane Bennett, female    DOB: 04/25/1966, 54 y.o.   MRN: [id number]  Chief Complaint  Patient presents with  . Follow-up    Referring provider: [facility]  HPI: Patient is a 54 yo female, never smoker. PMH significant for hx of covid 19 pneumonia. Patient of Dr. Loanne Drilling and last seen on 11/02/2019 for wheezing. These symptoms were persistent after the patient had covid-19 pneumonia from 09/13/2019.  Patient reports associated SOB with exertion. Possible DVT in January of 2021, started her on Xarelto prophylactically. Patient was started on Advair 230 mcg 2 puffs BID. Referred to pulmonary rehab and ENT for upper airway wheezing. Ordered for PFTs and DVT ultrasound.  Previous [facility] pulmonary encounters:  01/06/2020 Patient here for follow up office visit and PFT. She has seen some minor improvement but is still having persistent audible upper airway wheezing and shortness of breath with any exertion. Heat exacerbates her dyspnea. She will wake up at least once a night with shortness of breath having to use rescue inhaler. She sleeps with 3 pillows. Patient was using albuterol 6 times a day and now only uses albuterol 2-3 times a day since starting advair.  She does complain of some allergy symptoms today such as watery eyes, post-nasal drip. No sneezing or congestion. Interested in possibly trying an allergy medication. Denies any GERD symptoms. No heart burn, belching, acid indigestion.   She saw ENT on 11/30/2019 at [facility] with Dr. Redmond Baseman. 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 He referred her to speech pathology. Patient states case worker has not set up appointment with speech pathology and it's been over a month.   Pulmonary rehab started in November 16, 2019. Doing exercises to strenthen chest muscles to help with deep breathing. Still gets sob with exertion and hasn't  been able to do treadmill exercises yet.   At the end of the visit, workers comp [employer] manager brought in at end of visit which was approved by patient to be filled in on HPI.  01/20/2020 Patient contacted today for 2 week follow-up. She has noticed some improvement in wheezing with prednisone course and trial Spiriva. Heat continues to make her symptoms worse. Reports that phelgm gets caught in the back of her throat. States that she wakes up at night with cough. Noticed some associated belching/GERD symptoms when asked. She is still going to pulmonary rehab. She has not heard from speech pathology.   02/17/2020 Patient presents today for follow-up visit. She is struggling today with cough and wheezing. Heat makes her symptoms worse. She has been attending pulmonary rehab, the last two days have been difficulty d/t heat. She coughs to the point of gagging. Reports that her chest is also very tight. She is using her Advair twice daily and Spiriva respimat. Using albuterol at least every 4 hours the last several days. She feels pepcid has helped some. Prednisone has helped in the past. Vocal rehab continues to be denied. ENT has re-sent referral but she has not heard.    03/16/2020- Interim hx Patient presents today for follow-up hx COVID-19/moderate persistent asthma. She was given depo-medrol shot and prednisone taper at last visit for acute asthma exacerbation.   She states that she is better than she was but continues to have significant respiratory symptoms post covid. She has an upper airway wheeze and dyspnea on exertion. She is attending pulmonary rehab twice a  week. O2 level 91-92% when exercising. Takes her 5-10 minutes to recover. Takes Advair and Spiriva daily. She is using her Albuterol rescue inhaler 2-3 times a day. She is taking pepcid 20mg  at bedtime, flonase nasal spray, zyrtec. She is currently on prednisone 10mg  daily.   She did not have any breathing issues prior to getting COVID. She  is currently out of work on workers compensation. She is still having issues getting speech therapy covered. She has an apt with Dr. July 13th. She needs HRCT to evaluate for post covid fibrosis and possible tracheomalacia. She may also benefit from sleep study.    PFTs: FVC 1.27 (39), 1.06 (41%), ratio 84, positive bronchodilator response  Moderate-severe restrictive lung disease   Imaging: CTA 1/19 2021: Negative for pulmonary embolism. Faint bilateral patchy groundglass opacities without evidence of interstitial thickening, effusion or edema. REVIEWED BY DR. 2/19   Allergies  Allergen Reactions  . Other Hives  . Sulfa Antibiotics Hives  . Bee Venom Hives    Immunization History  Administered Date(s) Administered  . Influenza Whole 07/04/2019    Past Medical History:  Diagnosis Date  . DVT (deep venous thrombosis) (HCC)   . High cholesterol   . Pneumonia     Tobacco History: Social History   Tobacco Use  Smoking Status Never Smoker  Smokeless Tobacco Never Used   Counseling given: Not Answered   Outpatient Medications Prior to Visit  Medication Sig Dispense Refill  . albuterol (VENTOLIN HFA) 108 (90 Base) MCG/ACT inhaler INHALE 1 TO 2 PUFFS BY MOUTH EVERY 4 TO 6 HOURS 18 g 3  . cetirizine (ZYRTEC) 10 MG tablet Take 1 tablet by mouth once daily 30 tablet 5  . famotidine (PEPCID) 20 MG tablet Take 1 tablet (20 mg total) by mouth at bedtime. 30 tablet 1  . fluticasone (FLONASE) 50 MCG/ACT nasal spray Place 1 spray into both nostrils daily. 16 g 2  . fluticasone-salmeterol (ADVAIR HFA) 230-21 MCG/ACT inhaler Inhale 2 puffs into the lungs 2 (two) times daily. 1 Inhaler 12  . losartan-hydrochlorothiazide (HYZAAR) 50-12.5 MG tablet Take 1 tablet by mouth daily.    . ondansetron (ZOFRAN-ODT) 8 MG disintegrating tablet Take 8 mg by mouth every 6 (six) hours as needed.    . Tiotropium Bromide Monohydrate (SPIRIVA RESPIMAT) 1.25 MCG/ACT AERS Inhale 2 puffs into the  lungs daily. 4 g 5  . predniSONE (DELTASONE) 10 MG tablet Take 2 tablets (20 mg total) by mouth daily with breakfast for 14 days, THEN 1 tablet (10 mg total) daily with breakfast for 14 days. 42 tablet 0  . rivaroxaban (XARELTO) 20 MG TABS tablet Take 20 mg by mouth daily with supper.     No facility-administered medications prior to visit.   Review of Systems  Review of Systems  Constitutional: Positive for fatigue.  Respiratory: Positive for shortness of breath and wheezing. Negative for cough.    Physical Exam  BP 134/74 (BP Location: Left Arm, Cuff Size: Normal)   Pulse (!) 106   Temp 98.2 F (36.8 C) (Oral)   Ht 5\' 2"  (1.575 m)   Wt 232 lb 6.4 oz (105.4 kg)   BMI 42.51 kg/m  Physical Exam Constitutional:      Appearance: Normal appearance.  HENT:     Head: Normocephalic and atraumatic.     Mouth/Throat:     Mouth: Mucous membranes are moist.     Pharynx: Oropharynx is clear.     Comments: Mallampati class II-III Cardiovascular:  Rate and Rhythm: Normal rate and regular rhythm.  Pulmonary:     Breath sounds: Wheezing present.     Comments: Upper airway stridor/wheezing- improved but still significant  Neurological:     General: No focal deficit present.     Mental Status: She is alert and oriented to person, place, and time. Mental status is at baseline.  Psychiatric:        Mood and Affect: Mood normal.        Behavior: Behavior normal.        Thought Content: Thought content normal.        Judgment: Judgment normal.      Lab Results:  CBC    Component Value Date/Time   WBC 8.7 10/11/2019 2010   RBC 4.80 10/11/2019 2010   HGB 13.5 10/11/2019 2010   HCT 42.8 10/11/2019 2010   PLT 223 10/11/2019 2010   MCV 89.2 10/11/2019 2010   MCH 28.1 10/11/2019 2010   MCHC 31.5 10/11/2019 2010   RDW 15.1 10/11/2019 2010   LYMPHSABS 3.5 10/11/2019 2010   MONOABS 0.5 10/11/2019 2010   EOSABS 0.2 10/11/2019 2010   BASOSABS 0.1 10/11/2019 2010    BMET     Component Value Date/Time   NA 139 01/06/2020 1122   K 4.0 01/06/2020 1122   CL 101 01/06/2020 1122   CO2 30 01/06/2020 1122   GLUCOSE 96 01/06/2020 1122   BUN 13 01/06/2020 1122   CREATININE 0.85 01/06/2020 1122   CALCIUM 9.6 01/06/2020 1122   GFRNONAA >60 10/11/2019 2010   GFRAA >60 10/11/2019 2010    BNP No results found for: BNP  ProBNP No results found for: PROBNP  Imaging: No results found.   Assessment & Plan:   History of COVID-19 - Some improvement in upper airway wheezing since adding Famotidine. 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 in July   Vocal cord dysfunction - Patient saw ENT in March 2021 at [facility] with [facility]. [facility]. 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 This condition was previously referred to as the vocal cord dysfunction (VCD) syndrome. PVCM has been associated with psychosocial disorders, stress, exercise, perioperative airway and neurologic injury, gastroesophageal reflux, and irritant inhalational exposures"    Jenne Pane  Moderate persistent asthma - Continue Advair one puff twice daily and Spiriva  - Taper prednisone to 5mg  once daily until follow-up with [email or web address]   Seasonal allergies - Continue Flonase nasal spray and Zyrtec once daily   GERD (gastroesophageal reflux disease) - Add Omeprazole 20mg  daily; continue famotidine at bedtime    , NP 03/16/2020

## 2020-03-16 NOTE — Assessment & Plan Note (Signed)
-   Add Omeprazole 20mg  daily; continue famotidine at bedtime

## 2020-03-16 NOTE — Assessment & Plan Note (Signed)
-   Continue Advair one puff twice daily and Spiriva  - Taper prednisone to 5mg  once daily until follow-up with Dr. 

## 2020-03-19 ENCOUNTER — Telehealth: Payer: Self-pay | Admitting: Primary Care

## 2020-03-19 NOTE — Telephone Encounter (Signed)
CT order has been faxed to provided fax number. Nothing further needed.

## 2020-03-20 ENCOUNTER — Encounter (HOSPITAL_COMMUNITY)
Admission: RE | Admit: 2020-03-20 | Discharge: 2020-03-20 | Disposition: A | Discharge status: Home / Self Care | Payer: No Typology Code available for payment source | Source: Ambulatory Visit | Attending: Pulmonary Disease | Admitting: Pulmonary Disease

## 2020-03-20 ENCOUNTER — Other Ambulatory Visit: Payer: Self-pay

## 2020-03-20 DIAGNOSIS — R0602 Shortness of breath: Secondary | ICD-10-CM | POA: Diagnosis not present

## 2020-03-20 NOTE — Progress Notes (Signed)
Daily Session Note  Patient Details  Name: Jane Bennett MRN: [id number] Date of Birth: 08/20/1966 Referring Provider:     PULMONARY REHAB OTHER RESP ORIENTATION from 11/16/2019 in [facility]  Referring Provider Dr. Ellison      Encounter Date: 03/20/2020  Check In:  Session Check In - 03/20/20 1055      Check-In   Supervising physician immediately available to respond to emergencies See telemetry face sheet for immediately available MD    Location [facility]    Staff Present Phyllis Billingsley, RN;Vaniece Hatchett, Exercise Physiologist;Annedrea Stackhouse, RN, MHA    Virtual Visit No    Medication changes reported     Yes    Fall or balance concerns reported    No    Tobacco Cessation No Change    Warm-up and Cool-down Performed as group-led instruction    VAD Patient? No    PAD/SET Patient? No      Pain Assessment   Currently in Pain? No/denies           Capillary Blood Glucose: No results found for this or any previous visit (from the past 24 hour(s)).    Social History   Tobacco Use  Smoking Status Never Smoker  Smokeless Tobacco Never Used    Goals Met:  Proper associated with RPD/PD & O2 Sat Independence with exercise equipment Improved SOB with ADL's Using PLB without cueing & demonstrates good technique Strength training completed today  Goals Unmet:  Not Applicable  Comments: session 10:45-11:45   Dr. Jehanzeb Memon is Medical Director for [facility]. 

## 2020-03-22 ENCOUNTER — Other Ambulatory Visit: Payer: Self-pay

## 2020-03-22 ENCOUNTER — Encounter (HOSPITAL_COMMUNITY)
Admission: RE | Admit: 2020-03-22 | Discharge: 2020-03-22 | Disposition: A | Discharge status: Home / Self Care | Payer: No Typology Code available for payment source | Source: Ambulatory Visit | Attending: Pulmonary Disease | Admitting: Pulmonary Disease

## 2020-03-22 VITALS — Ht 62.0 in | Wt 233.7 lb

## 2020-03-22 DIAGNOSIS — R0602 Shortness of breath: Secondary | ICD-10-CM

## 2020-03-22 DIAGNOSIS — J410 Simple chronic bronchitis: Secondary | ICD-10-CM | POA: Diagnosis present

## 2020-03-22 NOTE — Progress Notes (Signed)
Daily Session Note  Patient Details  Name: Jane Bennett MRN: [id number] Date of Birth: 1965-12-28 Referring Provider:     PULMONARY REHAB OTHER RESP ORIENTATION from 11/16/2019 in [facility]  Referring Provider Dr. Loanne Drilling      Encounter Date: 03/22/2020  Check In:  Session Check In - 03/22/20 1057      Check-In   Staff Present Algis Downs, Exercise Physiologist;Ashleymarie Granderson, RN, [place]    Virtual Visit No    Medication changes reported     No    Fall or balance concerns reported    No    Tobacco Cessation No Change    Warm-up and Cool-down Performed as group-led instruction    Resistance Training Performed Yes    VAD Patient? No    PAD/SET Patient? No      Pain Assessment   Currently in Pain? No/denies    Pain Score 0-No pain    Multiple Pain Sites No           Capillary Blood Glucose: No results found for this or any previous visit (from the past 24 hour(s)).    Social History   Tobacco Use  Smoking Status Never Smoker  Smokeless Tobacco Never Used    Goals Met:  Proper associated with RPD/PD & O2 Sat Independence with exercise equipment Using PLB without cueing & demonstrates good technique Exercise tolerated well Strength training completed today  Goals Unmet:  Not Applicable  Comments: session 11:00-12:00   Dr. Kathie Dike is Medical Director for [facility].

## 2020-03-27 ENCOUNTER — Encounter (HOSPITAL_COMMUNITY)
Admission: RE | Admit: 2020-03-27 | Discharge: 2020-03-27 | Disposition: A | Discharge status: Home / Self Care | Payer: No Typology Code available for payment source | Source: Ambulatory Visit | Attending: Pulmonary Disease | Admitting: Pulmonary Disease

## 2020-03-27 ENCOUNTER — Other Ambulatory Visit: Payer: Self-pay

## 2020-03-27 DIAGNOSIS — R0602 Shortness of breath: Secondary | ICD-10-CM

## 2020-03-27 DIAGNOSIS — J410 Simple chronic bronchitis: Secondary | ICD-10-CM

## 2020-03-27 NOTE — Progress Notes (Signed)
Daily Session Note  Patient Details  Name: Jane Bennett MRN: [id number] Date of Birth: 05/18/1966 Referring Provider:     PULMONARY REHAB OTHER RESP ORIENTATION from 11/16/2019 in [place] [facility]  Referring Provider Dr. Ellison      Encounter Date: 03/27/2020  Check In:  Session Check In - 03/27/20 1045      Check-In   Supervising physician immediately available to respond to emergencies See telemetry face sheet for immediately available MD    Location [facility]    Staff Present Phyllis Billingsley, RN;Vaniece Hatchett, Exercise Physiologist;Annedrea Stackhouse, RN, MHA    Virtual Visit No    Medication changes reported     No    Fall or balance concerns reported    No    Tobacco Cessation No Change    Warm-up and Cool-down Performed as group-led instruction    Resistance Training Performed Yes    VAD Patient? No    PAD/SET Patient? No      Pain Assessment   Currently in Pain? No/denies    Pain Score 0-No pain    Multiple Pain Sites No           Capillary Blood Glucose: No results found for this or any previous visit (from the past 24 hour(s)).    Social History   Tobacco Use  Smoking Status Never Smoker  Smokeless Tobacco Never Used    Goals Met:  Proper associated with RPD/PD & O2 Sat Independence with exercise equipment Improved SOB with ADL's Using PLB without cueing & demonstrates good technique Exercise tolerated well Personal goals reviewed No report of cardiac concerns or symptoms Strength training completed today  Goals Unmet:  Not Applicable  Comments: check out 11:45   Dr. Jehanzeb Memon is Medical Director for [facility]. 

## 2020-03-29 ENCOUNTER — Other Ambulatory Visit: Payer: Self-pay

## 2020-03-29 ENCOUNTER — Encounter (HOSPITAL_COMMUNITY)
Admission: RE | Admit: 2020-03-29 | Discharge: 2020-03-29 | Disposition: A | Discharge status: Home / Self Care | Payer: No Typology Code available for payment source | Source: Ambulatory Visit | Attending: Pulmonary Disease | Admitting: Pulmonary Disease

## 2020-03-29 DIAGNOSIS — J410 Simple chronic bronchitis: Secondary | ICD-10-CM

## 2020-03-29 DIAGNOSIS — R0602 Shortness of breath: Secondary | ICD-10-CM

## 2020-03-29 NOTE — Progress Notes (Signed)
Discharge Progress Report  Patient Details  Name: Jane Bennett MRN: [id number] Date of Birth: [birth year] Referring Provider:     PULMONARY REHAB OTHER RESP ORIENTATION from 11/16/2019 in [place]  Referring Provider Dr. Loanne Drilling       Number of Visits: 36  Reason for Discharge:  Patient reached a stable level of exercise. Patient independent in their exercise. Patient has met program and personal goals.  Smoking History:  Social History   Tobacco Use  Smoking Status Never Smoker  Smokeless Tobacco Never Used    Diagnosis:  SOB (shortness of breath)  Simple chronic bronchitis (HCC)  ADL [facility]:  Pulmonary Assessment Scores    Row Name 11/16/19 1117 03/29/20 1604       ADL [facility]   ADL Phase Entry Exit    SOB Score total 90 88    Rest 0 0    Walk 11 3    Stairs 5 5    Bath 4 3    Dress 5 4    Shop 5 5      CAT Score   CAT Score 33 34      mMRC Score   mMRC Score 4 3           Initial Exercise Prescription:  Initial Exercise Prescription - 11/16/19 1100      Date of Initial Exercise RX and Referring Provider   Date 11/16/19    Referring Provider Dr. Loanne Drilling    Expected Discharge Date 02/13/20      Arm Ergometer   Level 1    Watts 7    RPM 30    Minutes 17    METs 1.5      T5 Nustep   Level 1    SPM 49    Minutes 22    METs 1.8      Prescription Details   Frequency (times per week) 2    Duration Progress to 30 minutes of continuous aerobic without signs/symptoms of physical distress      Intensity   THRR 40-80% of Max Heartrate 97-133-150    Ratings of Perceived Exertion 11-13    Perceived Dyspnea 0-4      Progression   Progression Continue to progress workloads to maintain intensity without signs/symptoms of physical distress.      Resistance Training   Training Prescription Yes    Weight 1    Reps 10-15           Discharge Exercise Prescription (Final Exercise Prescription Changes):  Exercise  Prescription Changes - 03/15/20 1214      Response to Exercise   Blood Pressure (Admit) 128/70    Blood Pressure (Exercise) 128/80    Blood Pressure (Exit) 130/80    Heart Rate (Admit) 92 bpm    Heart Rate (Exercise) 116 bpm    Heart Rate (Exit) 109 bpm    Oxygen Saturation (Admit) 96 %    Oxygen Saturation (Exercise) 96 %    Oxygen Saturation (Exit) 96 %    Rating of Perceived Exertion (Exercise) 12    Perceived Dyspnea (Exercise) 13    Duration Continue with 30 min of aerobic exercise without signs/symptoms of physical distress.    Intensity THRR unchanged      Progression   Progression Continue to progress workloads to maintain intensity without signs/symptoms of physical distress.      Resistance Training   Training Prescription Yes    Weight 4    Reps 10-15  Arm Ergometer   Level 2.1    Watts 18    RPM 50    Minutes 22    METs 2.2      T5 Nustep   Level 2    SPM 127    Minutes 17    METs 3.3           Functional Capacity:  6 Minute Walk    Row Name 11/16/19 1058 03/22/20 1247       6 Minute Walk   Phase Initial Discharge    Distance 700 feet 900 feet    Distance % Change -- 28.57 %    Distance Feet Change -- 200 ft    Walk Time 6 minutes 6 minutes    # of Rest Breaks 1 1    MPH 1.32 1.7    METS 2.01 2.47    RPE 14 13    Perceived Dyspnea  16 16    VO2 Peak 0.56 8.67    Symptoms Yes (comment) Yes (comment)    Comments Extreme SOB. Had to stop to rest for 2 minutes then was able to finish the test. She was SOB and rested for about thirty seconds. She was able to complete the walk test after resting with only 60mn and 45sec left.    Resting HR 83 bpm 93 bpm    Resting BP 170/98 124/70    Resting Oxygen Saturation  97 % 97 %    Exercise Oxygen Saturation  during 6 min walk 96 % 95 %    Max Ex. HR 107 bpm 113 bpm    Max Ex. BP 198/120 140/84    2 Minute Post BP 158/98 122/80      Interval HR   1 Minute HR -- 110    2 Minute HR -- 113    3  Minute HR -- 113    4 Minute HR -- 112    5 Minute HR -- 108    6 Minute HR -- 110    2 Minute Post HR -- 100    Interval Heart Rate? -- Yes      Interval Oxygen   Interval Oxygen? -- Yes    Baseline Oxygen Saturation % -- 97 %    1 Minute Oxygen Saturation % -- 96 %    1 Minute Liters of Oxygen -- 0 L    2 Minute Oxygen Saturation % -- 95 %    2 Minute Liters of Oxygen -- 0 L    3 Minute Oxygen Saturation % -- 95 %    3 Minute Liters of Oxygen -- 0 L    4 Minute Oxygen Saturation % -- 95 %    4 Minute Liters of Oxygen -- 0 L    5 Minute Oxygen Saturation % -- 97 %    5 Minute Liters of Oxygen -- 0 L    6 Minute Oxygen Saturation % -- 96 %    6 Minute Liters of Oxygen -- 0 L    2 Minute Post Oxygen Saturation % -- 97 %    2 Minute Post Liters of Oxygen -- 0 L           Psychological, QOL, Others - Outcomes: PHQ 2/9: Depression screen [facility]2/9 03/29/2020 11/16/2019  Decreased Interest 0 0  Down, Depressed, Hopeless 0 0  PHQ - 2 Score 0 0  Altered sleeping 1 1  Tired, decreased energy 2 2  Change in appetite 0 1  Feeling bad or failure about yourself  0 0  Trouble concentrating 0 0  Moving slowly or fidgety/restless 0 0  Suicidal thoughts 0 0  PHQ-9 Score 3 4  Difficult doing work/chores Very difficult Somewhat difficult    Quality of Life:  Quality of Life - 03/22/20 1533      Quality of Life   Select Quality of Life      Quality of Life Scores   Health/Function Pre 20.88 %    Health/Function Post 15.72 %    Health/Function % Change -24.71 %    Socioeconomic Pre 20.25 %    Socioeconomic Post 15.44 %    Socioeconomic % Change  -23.75 %    Psych/Spiritual Pre 20.86 %    Psych/Spiritual Post 18 %    Psych/Spiritual % Change -13.71 %    Family Pre 20.8 %    Family Post 22.8 %    Family % Change 9.62 %    GLOBAL Pre 20.72 %    GLOBAL Post 17.08 %    GLOBAL % Change -17.57 %           Personal Goals: Goals established at orientation with interventions  provided to work toward goal.  Personal Goals and Risk Factors at Admission - 11/16/19 1125      Core Components/Risk Factors/Patient Goals on Admission    Weight Management Weight Maintenance    Personal Goal Other Yes    Personal Goal Breath better, Be able to get back to normal ADL's, Get back to her daily life and to be able to return to work.    Intervention Attend program 2 x week and to supplement with 3 x week home exercise plan.    Expected Outcomes Reach expected goals.            Personal Goals Discharge:  Goals and Risk Factor Review    Row Name 11/24/19 1539 12/15/19 1526 01/13/20 1413 02/03/20 1528 02/29/20 1531     Core Components/Risk Factors/Patient Goals Review   Personal Goals Review Weight Management/Obesity  Get back to ADL's; and get back to her daily life. Breathe better. Weight Management/Obesity  Get bacl to ADL's; Get back to her daily life; breathe better. Weight Management/Obesity  Get back to ADL's; Get back to her daily life; breathe better. Weight Management/Obesity  Get back to ADL's; and get back to her daily life; breathe better. Weight Management/Obesity  Get back to ADL's and her daily life; breathe better.   Review Patient is new to the program. She has completed 3 sessions. She does say however that she is doing more at home since she has been attending the program because she is not as afraid to do more. Will continue to monitor for progress. Patient has completed 9 sessions gaining 3 llbs since she started the program. She is doing well in the program with some progression. She says she has good days and bad days with her breathing. She reports recently being exposed to yard mowing which has exacerbated her SOB and wheezing. She had to use her MDI during the session today. She still however feels like she is making progress in the program having more "good" days and attempting to do more at home. Will continue to monitor for progress. Patient has completed  16 sessions gaining 4 lbs since last 30 day review. She continues to do well in the program with progression. She reports doing more at home on the days she feels stronger. She feels like  the program has given her more strength. Her SOB has improved. She is able to walk from her car in the parking lot into the building to the gym now without having to stop and rest of getting extremely SOB. Will continue to monitor for progress. Patient has completed 22 sessions gaining 1 lb since last 30 day review. She continues to do well in the program with progression. She says she continues to do more at home on the days she feels stronger. She continues to have her days of increased SOB and fatigue. She continues to improve on her ability to walk into the building and to the gym without having to stop. She is doing well on her machines. She feels she is making progress. Will continue to monitor for progress. Patient has completed 28 sessions gaining 1 lb since last 30 day review. She continues to do well in the program with progression. She continues to try and do more at home but still has days of increased SOB and fatigue. She is doing well when she exercises and is able to walk into the building and into the department without having to stop and rest. She still feels like she is making progress in the program. Will continue to monitor for progress.   Expected Outcomes Patient will continue to attend sessions and complete the program meeting her personal goals. Patient will continue to attend sessions and complete the program meeting her personal goals. Patient will continue to attend sessions and complete the program meeting both personal and program goals. Patient will continue to attend sessions and complete the program meeting both personal and program goals. Patient will continue to attend sessions and complete the program meeting both personal and program goals.   [place] Name 03/29/20 1612             Core  Components/Risk Factors/Patient Goals Review   Personal Goals Review Weight Management/Obesity  Get back to ADL's and her daily life; breathe better.       Review Patient graduated with 36 sessions gaining 11 lbs since her initial visit. She did very well in the program. Her exit walk test increased by 28.57% and her exit measurements improved in grip strength, balance and flexbility. She says she is able to walk longer distance now. She can walk for 10 minutes without having to stop due to SOB. She says she is able to do ADL's for 10 to 15 minutes now before she gets severly SOB. She was not able to do any ADL's before the program and only walk a very short distance. She is very pleased with her progress in the program and feels she has successfully met her goals. She plans to continue exercising at home by using weights and bands and walking. PR will f/u for one year.       Expected Outcomes Patient will continue exercising at home by using weights and bands and walking and continue to  meet her personal goals.              Exercise Goals and Review:  Exercise Goals    Row Name 11/16/19 1108             Exercise Goals   Increase Physical Activity Yes       Intervention Provide advice, education, support and counseling about physical activity/exercise needs.;Develop an individualized exercise prescription for aerobic and resistive training based on initial evaluation findings, risk stratification, comorbidities and participant's personal goals.  Expected Outcomes Short Term: Attend rehab on a regular basis to increase amount of physical activity.;Long Term: Add in home exercise to make exercise part of routine and to increase amount of physical activity.       Increase Strength and Stamina Yes       Intervention Provide advice, education, support and counseling about physical activity/exercise needs.;Develop an individualized exercise prescription for aerobic and resistive training based  on initial evaluation findings, risk stratification, comorbidities and participant's personal goals.       Expected Outcomes Short Term: Perform resistance training exercises routinely during rehab and add in resistance training at home;Long Term: Improve cardiorespiratory fitness, muscular endurance and strength as measured by increased METs and functional capacity (6MWT)       Able to understand and use rate of perceived exertion (RPE) scale Yes       Intervention Provide education and explanation on how to use RPE scale       Expected Outcomes Short Term: Able to use RPE daily in rehab to express subjective intensity level;Long Term:  Able to use RPE to guide intensity level when exercising independently       Able to understand and use Dyspnea scale Yes       Intervention Provide education and explanation on how to use Dyspnea scale       Expected Outcomes Short Term: Able to use Dyspnea scale daily in rehab to express subjective sense of shortness of breath during exertion;Long Term: Able to use Dyspnea scale to guide intensity level when exercising independently       Knowledge and understanding of Target Heart Rate Range (THRR) Yes       Intervention Provide education and explanation of THRR including how the numbers were predicted and where they are located for reference       Expected Outcomes Short Term: Able to use daily as guideline for intensity in rehab;Long Term: Able to use THRR to govern intensity when exercising independently       Able to check pulse independently Yes       Intervention Provide education and demonstration on how to check pulse in carotid and radial arteries.;Review the importance of being able to check your own pulse for safety during independent exercise       Expected Outcomes Short Term: Able to explain why pulse checking is important during independent exercise;Long Term: Able to check pulse independently and accurately       Understanding of Exercise  Prescription Yes       Intervention Provide education, explanation, and written materials on patient's individual exercise prescription       Expected Outcomes Short Term: Able to explain program exercise prescription;Long Term: Able to explain home exercise prescription to exercise independently              Exercise Goals Re-Evaluation:  Exercise Goals Re-Evaluation    Row Name 11/24/19 1501 12/16/19 1436 01/12/20 1809 02/06/20 1634 02/20/20 1502     Exercise Goal Re-Evaluation   Exercise Goals Review Increase Physical Activity;Increase Strength and Stamina Increase Physical Activity;Increase Strength and Stamina Increase Physical Activity;Increase Strength and Stamina Increase Physical Activity;Increase Strength and Stamina Increase Physical Activity;Increase Strength and Stamina   Comments Patient has just started the program. This is her 3rd visit. We will continue to monitor her progress. Patient goals are to breathe better and to get back to normal ADL's. Her goals are to breathe better, get back to her daily life which includes going back  to work. Patient wants to get back to normal ADLs and her daily life. She also wants to breathe better. Patient goals are to breathe better, to get back to her normal ADLs and daily life. Kerrilynn has done well despite her having a really hard time breathing. She will push through how she is feeling anyway and finish all of the machines. We will continue to monitor her progress.   Expected Outcomes Reach her expected goals of breathing better and getting back to normal ADL's. she also wants to get back to work. Reach her personal goals Reach her goals To reach expected goals To readh her expected goals.   [place] Name 03/22/20 1538             Exercise Goal Re-Evaluation   Exercise Goals Review Increase Physical Activity;Increase Strength and Stamina       Comments She said that her SOB has improved with walking but she still SOB with other ADL's. She said  overall she feels that program has helped her.       Expected Outcomes To reach expected outcomes              Nutrition & Weight - Outcomes:  Pre Biometrics - 11/16/19 1111      Pre Biometrics   Height _0  (1.575 m)    Weight 101.8 kg    Waist Circumference 44 inches    Hip Circumference 45.5 inches    Waist to Hip Ratio 0.97 %    BMI (Calculated) 41.05    Triceps Skinfold 26 mm    % Body Fat 48.4 %    Grip Strength 20.8 kg    Flexibility 7.6 in    Single Leg Stand 25 seconds           Post Biometrics - 03/22/20 1258       Post  Biometrics   Height _1  (1.575 m)    Weight 106 kg    Waist Circumference 44 inches    Hip Circumference 48 inches    Waist to Hip Ratio 0.92 %    BMI (Calculated) 42.73    Triceps Skinfold 50 mm    % Body Fat 52.9 %    Grip Strength 28.23 kg    Flexibility 12.91 in    Single Leg Stand 28.05 seconds           Nutrition:  Nutrition Therapy & Goals - 02/29/20 1528      Personal Nutrition Goals   Comments We continue to work with patient and RD to schedule RD classes. Her medificts diet assessment score was 33. She continues to say she is trying to eat healthy. Will continue to monitor for progress.      Intervention Plan   Intervention Nutrition handout(s) given to patient.           Nutrition Discharge:  Nutrition Assessments - 03/29/20 1608      MEDFICTS Scores   Pre Score 33    Post Score 21    Score Difference -12           Education Questionnaire Score:  Knowledge Questionnaire Score - 03/29/20 1608      Knowledge Questionnaire Score   Pre Score 15/18    Post Score 16/18           Goals reviewed with patient; copy given to patient.

## 2020-03-29 NOTE — Progress Notes (Signed)
Daily Session Note  Patient Details  Name: RAYSA BOSAK MRN: [id number] Date of Birth: [birth year] Referring Provider:     PULMONARY REHAB OTHER RESP ORIENTATION from 11/16/2019 in [place]  Referring Provider Dr. Loanne Drilling      Encounter Date: 03/29/2020  Check In:  Session Check In - 03/29/20 1045      Check-In   Supervising physician immediately available to respond to emergencies See telemetry face sheet for immediately available MD    Location [facility]    Staff Present Algis Downs, Exercise Physiologist;Lilymarie Scroggins [name], RN, BSN    Virtual Visit No    Medication changes reported     No    Fall or balance concerns reported    No    Tobacco Cessation No Change    Warm-up and Cool-down Performed as group-led instruction    Resistance Training Performed Yes    VAD Patient? No    PAD/SET Patient? No      Pain Assessment   Currently in Pain? No/denies    Pain Score 0-No pain    Multiple Pain Sites No           Capillary Blood Glucose: No results found for this or any previous visit (from the past 24 hour(s)).    Social History   Tobacco Use  Smoking Status Never Smoker  Smokeless Tobacco Never Used    Goals Met:  Proper associated with RPD/PD & O2 Sat Independence with exercise equipment Improved SOB with ADL's Using PLB without cueing & demonstrates good technique Exercise tolerated well No report of cardiac concerns or symptoms Strength training completed today  Goals Unmet:  Not Applicable  Comments: Check out 1145.   Dr. Kathie Dike is Medical Director for [facility].

## 2020-03-29 NOTE — Progress Notes (Signed)
Pulmonary Individual Treatment Plan  Patient Details  Name: Jane Bennett MRN: [id number] Date of Birth: [birth year] Referring Provider:     PULMONARY REHAB OTHER RESP ORIENTATION from 11/16/2019 in [facility]  Referring Provider Dr. Loanne Drilling      Initial Encounter Date:    PULMONARY Jane Bennett from 11/16/2019 in [facility]  Date 11/16/19      Visit Diagnosis: SOB (shortness of breath)  Simple chronic bronchitis ([place])  Patient's Home Medications on Admission:   Current Outpatient Medications:  .  albuterol (VENTOLIN HFA) 108 (90 Base) MCG/ACT inhaler, INHALE 1 TO 2 PUFFS BY MOUTH EVERY 4 TO 6 HOURS, Disp: 18 g, Rfl: 3 .  cetirizine (ZYRTEC) 10 MG tablet, Take 1 tablet by mouth once daily, Disp: 30 tablet, Rfl: 5 .  famotidine (PEPCID) 20 MG tablet, Take 1 tablet (20 mg total) by mouth at bedtime., Disp: 30 tablet, Rfl: 1 .  fluticasone (FLONASE) 50 MCG/ACT nasal spray, Place 1 spray into both nostrils daily., Disp: 16 g, Rfl: 2 .  fluticasone-salmeterol (ADVAIR HFA) 230-21 MCG/ACT inhaler, Inhale 2 puffs into the lungs 2 (two) times daily., Disp: 1 Inhaler, Rfl: 12 .  losartan-hydrochlorothiazide (HYZAAR) 50-12.5 MG tablet, Take 1 tablet by mouth daily., Disp: , Rfl:  .  omeprazole (PRILOSEC) 20 MG capsule, Take 1 capsule (20 mg total) by mouth daily., Disp: 30 capsule, Rfl: 2 .  ondansetron (ZOFRAN-ODT) 8 MG disintegrating tablet, Take 8 mg by mouth every 6 (six) hours as needed., Disp: , Rfl:  .  predniSONE (DELTASONE) 5 MG tablet, Take 1 tablet (5 mg total) by mouth daily with breakfast. Until next follow-up, Disp: 30 tablet, Rfl: 0 .  Tiotropium Bromide Monohydrate (SPIRIVA RESPIMAT) 1.25 MCG/ACT AERS, Inhale 2 puffs into the lungs daily., Disp: 4 g, Rfl: 5  Past Medical History: Past Medical History:  Diagnosis Date  . DVT (deep venous thrombosis) ([place])   . High cholesterol   . Pneumonia     Tobacco  Use: Social History   Tobacco Use  Smoking Status Never Smoker  Smokeless Tobacco Never Used    Labs: Recent Review Flowsheet Data   There is no flowsheet data to display.     Capillary Blood Glucose: No results found for: GLUCAP   Pulmonary Assessment Scores:  Pulmonary Assessment Scores    Row Name 11/16/19 1117 03/29/20 1604       ADL UCSD   ADL Phase Entry Exit    SOB Score total 90 88    Rest 0 0    Walk 11 3    Stairs 5 5    Bath 4 3    Dress 5 4    Shop 5 5      CAT Score   CAT Score 33 34      mMRC Score   mMRC Score 4 3          UCSD: Self-administered rating of dyspnea associated with activities of daily living (ADLs) 6-point scale (0 = "not at all" to 5 = "maximal or unable to do because of breathlessness")  Scoring Scores range from 0 to 120.  Minimally important difference is 5 units  CAT: CAT can identify the health impairment of COPD patients and is better correlated with disease progression.  CAT has a scoring range of zero to 40. The CAT score is classified into four groups of low (less than 10), medium (10 - 20), high (21-30) and very high (31-40)  based on the impact level of disease on health status. A CAT score over 10 suggests significant symptoms.  A worsening CAT score could be explained by an exacerbation, poor medication adherence, poor inhaler technique, or progression of COPD or comorbid conditions.  CAT MCID is 2 points  mMRC: mMRC (Modified Medical Research Council) Dyspnea Scale is used to assess the degree of baseline functional disability in patients of respiratory disease due to dyspnea. No minimal important difference is established. A decrease in score of 1 point or greater is considered a positive change.   Pulmonary Function Assessment:   Exercise Target Goals: Exercise Program Goal: Individual exercise prescription set using results from initial 6 min walk test and THRR while considering  patient's activity barriers  and safety.   Exercise Prescription Goal: Initial exercise prescription builds to 30-45 minutes a day of aerobic activity, 2-3 days per week.  Home exercise guidelines will be given to patient during program as part of exercise prescription that the participant will acknowledge.  Activity Barriers & Risk Stratification:  Activity Barriers & Cardiac Risk Stratification - 11/16/19 1103      Activity Barriers & Cardiac Risk Stratification   Activity Barriers Shortness of Breath    Cardiac Risk Stratification Moderate           6 Minute Walk:  6 Minute Walk    Row Name 11/16/19 1058 03/22/20 1247       6 Minute Walk   Phase Initial Discharge    Distance 700 feet 900 feet    Distance % Change -- 28.57 %    Distance Feet Change -- 200 ft    Walk Time 6 minutes 6 minutes    # of Rest Breaks 1 1    MPH 1.32 1.7    METS 2.01 2.47    RPE 14 13    Perceived Dyspnea  16 16    VO2 Peak 0.56 8.67    Symptoms Yes (comment) Yes (comment)    Comments Extreme SOB. Had to stop to rest for 2 minutes then was able to finish the test. She was SOB and rested for about thirty seconds. She was able to complete the walk test after resting with only 64mn and 45sec left.    Resting HR 83 bpm 93 bpm    Resting BP 170/98 124/70    Resting Oxygen Saturation  97 % 97 %    Exercise Oxygen Saturation  during 6 min walk 96 % 95 %    Max Ex. HR 107 bpm 113 bpm    Max Ex. BP 198/120 140/84    2 Minute Post BP 158/98 122/80      Interval HR   1 Minute HR -- 110    2 Minute HR -- 113    3 Minute HR -- 113    4 Minute HR -- 112    5 Minute HR -- 108    6 Minute HR -- 110    2 Minute Post HR -- 100    Interval Heart Rate? -- Yes      Interval Oxygen   Interval Oxygen? -- Yes    Baseline Oxygen Saturation % -- 97 %    1 Minute Oxygen Saturation % -- 96 %    1 Minute Liters of Oxygen -- 0 L    2 Minute Oxygen Saturation % -- 95 %    2 Minute Liters of Oxygen -- 0 L    3 Minute Oxygen Saturation % --  95 %    3 Minute Liters of Oxygen -- 0 L    4 Minute Oxygen Saturation % -- 95 %    4 Minute Liters of Oxygen -- 0 L    5 Minute Oxygen Saturation % -- 97 %    5 Minute Liters of Oxygen -- 0 L    6 Minute Oxygen Saturation % -- 96 %    6 Minute Liters of Oxygen -- 0 L    2 Minute Post Oxygen Saturation % -- 97 %    2 Minute Post Liters of Oxygen -- 0 L           Oxygen Initial Assessment:  Oxygen Initial Assessment - 11/16/19 1116      Home Oxygen   Home Oxygen Device None    Sleep Oxygen Prescription None    Home Exercise Oxygen Prescription None    Home at Rest Exercise Oxygen Prescription None      Initial 6 min Walk   Oxygen Used None      Program Oxygen Prescription   Program Oxygen Prescription None           Oxygen Re-Evaluation:  Oxygen Re-Evaluation    Row Name 11/24/19 1537 12/15/19 1525 01/13/20 1413 02/03/20 1528 02/29/20 1527     Program Oxygen Prescription   Program Oxygen Prescription None None None None None     Home Oxygen   Home Oxygen Device None None None None None   Sleep Oxygen Prescription None None None None None   Home Exercise Oxygen Prescription None None None None None   Home at Rest Exercise Oxygen Prescription None None None None None   Compliance with Home Oxygen Use Yes Yes Yes Yes Yes     Goals/Expected Outcomes   Short Term Goals To learn and exhibit compliance with exercise, home and travel O2 prescription;To learn and understand importance of monitoring SPO2 with pulse oximeter and demonstrate accurate use of the pulse oximeter.;To learn and understand importance of maintaining oxygen saturations>88%;To learn and demonstrate proper pursed lip breathing techniques or other breathing techniques. To learn and exhibit compliance with exercise, home and travel O2 prescription;To learn and understand importance of monitoring SPO2 with pulse oximeter and demonstrate accurate use of the pulse oximeter.;To learn and understand importance of  maintaining oxygen saturations>88%;To learn and demonstrate proper pursed lip breathing techniques or other breathing techniques. To learn and exhibit compliance with exercise, home and travel O2 prescription;To learn and understand importance of monitoring SPO2 with pulse oximeter and demonstrate accurate use of the pulse oximeter.;To learn and understand importance of maintaining oxygen saturations>88%;To learn and demonstrate proper pursed lip breathing techniques or other breathing techniques. To learn and exhibit compliance with exercise, home and travel O2 prescription;To learn and understand importance of monitoring SPO2 with pulse oximeter and demonstrate accurate use of the pulse oximeter.;To learn and understand importance of maintaining oxygen saturations>88%;To learn and demonstrate proper pursed lip breathing techniques or other breathing techniques. To learn and exhibit compliance with exercise, home and travel O2 prescription;To learn and understand importance of monitoring SPO2 with pulse oximeter and demonstrate accurate use of the pulse oximeter.;To learn and understand importance of maintaining oxygen saturations>88%;To learn and demonstrate proper pursed lip breathing techniques or other breathing techniques.   Long  Term Goals Exhibits compliance with exercise, home and travel O2 prescription;Verbalizes importance of monitoring SPO2 with pulse oximeter and return demonstration;Maintenance of O2 saturations>88%;Exhibits proper breathing techniques, such as pursed lip breathing or other method taught  during program session;Compliance with respiratory medication Exhibits compliance with exercise, home and travel O2 prescription;Verbalizes importance of monitoring SPO2 with pulse oximeter and return demonstration;Maintenance of O2 saturations>88%;Exhibits proper breathing techniques, such as pursed lip breathing or other method taught during program session;Compliance with respiratory  medication Exhibits compliance with exercise, home and travel O2 prescription;Verbalizes importance of monitoring SPO2 with pulse oximeter and return demonstration;Maintenance of O2 saturations>88%;Exhibits proper breathing techniques, such as pursed lip breathing or other method taught during program session;Compliance with respiratory medication Exhibits compliance with exercise, home and travel O2 prescription;Verbalizes importance of monitoring SPO2 with pulse oximeter and return demonstration;Maintenance of O2 saturations>88%;Exhibits proper breathing techniques, such as pursed lip breathing or other method taught during program session;Compliance with respiratory medication Exhibits compliance with exercise, home and travel O2 prescription;Verbalizes importance of monitoring SPO2 with pulse oximeter and return demonstration;Maintenance of O2 saturations>88%;Exhibits proper breathing techniques, such as pursed lip breathing or other method taught during program session;Compliance with respiratory medication   Comments Patient is able to verbalize the importance of monitoring her SPO2 and maintaining her O2 saturation >88%. She is also demonstrate proper pursed lip breathing technique in class and is compliant with her exercise prescription and medications. Patient is able to verbalize the importance of monitoring her SPO2 and maintaining her O2 saturation >88% and to demonstrate proper usage of pulse oximeter. She is also able to demonstrate proper pursed lip breathing technique in class and is compliant with her exercise prescription and medications. Patient is able to verbalize the importance of monitoring her SPO2 and maintaining her O2 saturation >88% and to demonstrate proper usage of pulse oximeter. She is also able to demonstrate proper pursed lip breathing technique in class and is compliant with her exercise prescription and medications. Patient is able to verbalize the importance of monitoring her  SPO2 and maintaining her O2 saturation >88% and to demonstrate proper usage of pulse oximeter. She is also able to demonstrate proper pursed lip breathing technique in class and is compliant with her exercise prescription and medications. Patient is able to verbalize the importance of monitoring her SPO2 and maintaining her O2 saturation >88% and to demonstrate proper usage of pulse oximeter. She is also able to demonstrate proper pursed lip breathing technique in class and is compliant with her exercise prescription and medications.   Goals/Expected Outcomes Patient is meeting her expected goals and outcomes. Will continue to monitor. Patient is meeting her expected goals and outcomes. Will continue to monitor. Patient is meeting her expected goals and outcomes. Will continue to monitor. Patient is meeting her expected goals and outcomes. Will continue to monitor. Patient is meeting her expected goals and outcomes. Will continue to monitor.          Oxygen Discharge (Final Oxygen Re-Evaluation):  Oxygen Re-Evaluation - 02/29/20 1527      Program Oxygen Prescription   Program Oxygen Prescription None      Home Oxygen   Home Oxygen Device None    Sleep Oxygen Prescription None    Home Exercise Oxygen Prescription None    Home at Rest Exercise Oxygen Prescription None    Compliance with Home Oxygen Use Yes      Goals/Expected Outcomes   Short Term Goals To learn and exhibit compliance with exercise, home and travel O2 prescription;To learn and understand importance of monitoring SPO2 with pulse oximeter and demonstrate accurate use of the pulse oximeter.;To learn and understand importance of maintaining oxygen saturations>88%;To learn and demonstrate proper pursed lip breathing techniques or  other breathing techniques.    Long  Term Goals Exhibits compliance with exercise, home and travel O2 prescription;Verbalizes importance of monitoring SPO2 with pulse oximeter and return  demonstration;Maintenance of O2 saturations>88%;Exhibits proper breathing techniques, such as pursed lip breathing or other method taught during program session;Compliance with respiratory medication    Comments Patient is able to verbalize the importance of monitoring her SPO2 and maintaining her O2 saturation >88% and to demonstrate proper usage of pulse oximeter. She is also able to demonstrate proper pursed lip breathing technique in class and is compliant with her exercise prescription and medications.    Goals/Expected Outcomes Patient is meeting her expected goals and outcomes. Will continue to monitor.           Initial Exercise Prescription:  Initial Exercise Prescription - 11/16/19 1100      Date of Initial Exercise RX and Referring Provider   Date 11/16/19    Referring Provider Dr. Loanne Drilling    Expected Discharge Date 02/13/20      Arm Ergometer   Level 1    Watts 7    RPM 30    Minutes 17    METs 1.5      T5 Nustep   Level 1    SPM 49    Minutes 22    METs 1.8      Prescription Details   Frequency (times per week) 2    Duration Progress to 30 minutes of continuous aerobic without signs/symptoms of physical distress      Intensity   THRR 40-80% of Max Heartrate 97-133-150    Ratings of Perceived Exertion 11-13    Perceived Dyspnea 0-4      Progression   Progression Continue to progress workloads to maintain intensity without signs/symptoms of physical distress.      Resistance Training   Training Prescription Yes    Weight 1    Reps 10-15           Perform Capillary Blood Glucose checks as needed.  Exercise Prescription Changes:   Exercise Prescription Changes    Row Name 12/16/19 1400 01/12/20 1800 02/06/20 1600 02/20/20 1400 03/05/20 1500     Response to Exercise   Blood Pressure (Admit) 168/98 140/74 168/84 130/90 140/80   Blood Pressure (Exercise) 160/100 158/92 140/88 158/78 164/84   Blood Pressure (Exit) 150/92 120/86 126/82 770/70 112/72    Heart Rate (Admit) 104 bpm 88 bpm 92 bpm 86 bpm 86 bpm   Heart Rate (Exercise) 102 bpm 102 bpm 95 bpm 106 bpm 122 bpm   Heart Rate (Exit) 96 bpm 99 bpm 97 bpm 98 bpm 104 bpm   Oxygen Saturation (Admit) 96 % 98 % 97 % 97 % 98 %   Oxygen Saturation (Exercise) 96 % 93 % 94 % 97 % 94 %   Oxygen Saturation (Exit) 96 % 94 % 97 % 97 % 97 %   Rating of Perceived Exertion (Exercise) '12 12 12 12 12   '$ Perceived Dyspnea (Exercise) '12 12 12 12 13   '$ Symptoms extreme SOB extreme SOB extreme SOB extreme SOB --   Duration Continue with 30 min of aerobic exercise without signs/symptoms of physical distress. Continue with 30 min of aerobic exercise without signs/symptoms of physical distress. Continue with 30 min of aerobic exercise without signs/symptoms of physical distress. Continue with 30 min of aerobic exercise without signs/symptoms of physical distress. Continue with 30 min of aerobic exercise without signs/symptoms of physical distress.   Intensity THRR unchanged THRR  unchanged THRR unchanged THRR unchanged THRR unchanged     Progression   Progression Continue to progress workloads to maintain intensity without signs/symptoms of physical distress. Continue to progress workloads to maintain intensity without signs/symptoms of physical distress. Continue to progress workloads to maintain intensity without signs/symptoms of physical distress. Continue to progress workloads to maintain intensity without signs/symptoms of physical distress. Continue to progress workloads to maintain intensity without signs/symptoms of physical distress.   Average METs 1.8 1.95 2.3 2.5 --     Resistance Training   Training Prescription Yes Yes Yes Yes Yes   Weight '2 3 4 '$ -- 4   Reps 10-15 10-15 10-15 10-15 10-15   Time 10 Minutes 10 Minutes 10 Minutes 10 Minutes --     Oxygen   Liters -- -- -- 0 --     Arm Ergometer   Level 1.2 1.7 1.9 2.1 2.1   Watts '13 14 19 20 21   '$ RPM 51 55 54 52 53   Minutes '17 17 17 17 17   '$ METs  1.8 1.9 2.3 2.3 2.4     T5 Nustep   Level '1 2 2 2 2   '$ SPM 88 100 110 125 126   Minutes '22 22 22 22 22   '$ METs 1.8 2 2.3 2.7 3     Home Exercise Plan   Plans to continue exercise at Home (comment) Home (comment) Home (comment) Home (comment) --   Frequency Add 3 additional days to program exercise sessions. Add 3 additional days to program exercise sessions. Add 3 additional days to program exercise sessions. Add 3 additional days to program exercise sessions. --   Initial Home Exercises Provided 11/16/19 11/16/19 11/16/19 11/16/19 --   [place] Name 03/15/20 1214             Response to Exercise   Blood Pressure (Admit) 128/70       Blood Pressure (Exercise) 128/80       Blood Pressure (Exit) 130/80       Heart Rate (Admit) 92 bpm       Heart Rate (Exercise) 116 bpm       Heart Rate (Exit) 109 bpm       Oxygen Saturation (Admit) 96 %       Oxygen Saturation (Exercise) 96 %       Oxygen Saturation (Exit) 96 %       Rating of Perceived Exertion (Exercise) 12       Perceived Dyspnea (Exercise) 13       Duration Continue with 30 min of aerobic exercise without signs/symptoms of physical distress.       Intensity THRR unchanged         Progression   Progression Continue to progress workloads to maintain intensity without signs/symptoms of physical distress.         Resistance Training   Training Prescription Yes       Weight 4       Reps 10-15         Arm Ergometer   Level 2.1       Watts 18       RPM 50       Minutes 22       METs 2.2         T5 Nustep   Level 2       SPM 127       Minutes 17       METs 3.3  Exercise Comments:   Exercise Comments    Row Name 11/16/19 1109 11/24/19 1503 12/16/19 1437 01/12/20 1810 02/06/20 1636   Exercise Comments Today was patients inital assessment/orientation. She did well considering her residual effects of SOB and Wheezing post COVID-19. She is eager and wants to attend so that she can improve and decrease her SOB.  Will continue to progress as tolerated on exercise machines and weights. Patient has completed 9 sessions. She is still dealing with the residuals of having COVID-19. She has a very difficult time breathing. Inspite of this she still comes consistently and tries very hard on each machine. Will continue to progress her workloads and weights as tolerated. Arlee has been consistent with her attendance. She tries very hard with her exercise and progressions in spite of her difficulty breathing. She says that coming to PR is making her stronger. We will continue to progress as tolerated. Elyce has been consistent with her attendance. She tries very hard with her exercise and progressions in spite of her difficulty breathing. She says that coming to PR is making her stronger. We will continue to progress as tolerated.   [place] Name 02/20/20 1505 03/22/20 1545         Exercise Comments Patient has excellent attendance. Even when she is struggling to breathe she show up to class ready to give her 100%. She says that this is helping her to feel better and she is able to do more of her daily activites. We will continue to progress as tolerated. She has great attendance and preforms well. She works hard during [employer], She is not as SOB upon entry. She has really opened up and talks more in class.             Exercise Goals and Review:   Exercise Goals    Row Name 11/16/19 1108             Exercise Goals   Increase Physical Activity Yes       Intervention Provide advice, education, support and counseling about physical activity/exercise needs.;Develop an individualized exercise prescription for aerobic and resistive training based on initial evaluation findings, risk stratification, comorbidities and participant's personal goals.       Expected Outcomes Short Term: Attend rehab on a regular basis to increase amount of physical activity.;Long Term: Add in home exercise to make exercise part of routine and  to increase amount of physical activity.       Increase Strength and Stamina Yes       Intervention Provide advice, education, support and counseling about physical activity/exercise needs.;Develop an individualized exercise prescription for aerobic and resistive training based on initial evaluation findings, risk stratification, comorbidities and participant's personal goals.       Expected Outcomes Short Term: Perform resistance training exercises routinely during rehab and add in resistance training at home;Long Term: Improve cardiorespiratory fitness, muscular endurance and strength as measured by increased METs and functional capacity (6MWT)       Able to understand and use rate of perceived exertion (RPE) scale Yes       Intervention Provide education and explanation on how to use RPE scale       Expected Outcomes Short Term: Able to use RPE daily in rehab to express subjective intensity level;Long Term:  Able to use RPE to guide intensity level when exercising independently       Able to understand and use Dyspnea scale Yes       Intervention Provide  education and explanation on how to use Dyspnea scale       Expected Outcomes Short Term: Able to use Dyspnea scale daily in rehab to express subjective sense of shortness of breath during exertion;Long Term: Able to use Dyspnea scale to guide intensity level when exercising independently       Knowledge and understanding of Target Heart Rate Range (THRR) Yes       Intervention Provide education and explanation of THRR including how the numbers were predicted and where they are located for reference       Expected Outcomes Short Term: Able to use daily as guideline for intensity in rehab;Long Term: Able to use THRR to govern intensity when exercising independently       Able to check pulse independently Yes       Intervention Provide education and demonstration on how to check pulse in carotid and radial arteries.;Review the importance of being able  to check your own pulse for safety during independent exercise       Expected Outcomes Short Term: Able to explain why pulse checking is important during independent exercise;Long Term: Able to check pulse independently and accurately       Understanding of Exercise Prescription Yes       Intervention Provide education, explanation, and written materials on patient's individual exercise prescription       Expected Outcomes Short Term: Able to explain program exercise prescription;Long Term: Able to explain home exercise prescription to exercise independently              Exercise Goals Re-Evaluation :  Exercise Goals Re-Evaluation    Row Name 11/24/19 1501 12/16/19 1436 01/12/20 1809 02/06/20 1634 02/20/20 1502     Exercise Goal Re-Evaluation   Exercise Goals Review Increase Physical Activity;Increase Strength and Stamina Increase Physical Activity;Increase Strength and Stamina Increase Physical Activity;Increase Strength and Stamina Increase Physical Activity;Increase Strength and Stamina Increase Physical Activity;Increase Strength and Stamina   Comments Patient has just started the program. This is her 3rd visit. We will continue to monitor her progress. Patient goals are to breathe better and to get back to normal ADL's. Her goals are to breathe better, get back to her daily life which includes going back to work. Patient wants to get back to normal ADLs and her daily life. She also wants to breathe better. Patient goals are to breathe better, to get back to her normal ADLs and daily life. Gaynell has done well despite her having a really hard time breathing. She will push through how she is feeling anyway and finish all of the machines. We will continue to monitor her progress.   Expected Outcomes Reach her expected goals of breathing better and getting back to normal ADL's. she also wants to get back to work. Reach her personal goals Reach her goals To reach expected goals To readh her expected  goals.   [place] Name 03/22/20 1538             Exercise Goal Re-Evaluation   Exercise Goals Review Increase Physical Activity;Increase Strength and Stamina       Comments She said that her SOB has improved with walking but she still SOB with other ADL's. She said overall she feels that program has helped her.       Expected Outcomes To reach expected outcomes              Discharge Exercise Prescription (Final Exercise Prescription Changes):  Exercise Prescription Changes - 03/15/20 1214  Response to Exercise   Blood Pressure (Admit) 128/70    Blood Pressure (Exercise) 128/80    Blood Pressure (Exit) 130/80    Heart Rate (Admit) 92 bpm    Heart Rate (Exercise) 116 bpm    Heart Rate (Exit) 109 bpm    Oxygen Saturation (Admit) 96 %    Oxygen Saturation (Exercise) 96 %    Oxygen Saturation (Exit) 96 %    Rating of Perceived Exertion (Exercise) 12    Perceived Dyspnea (Exercise) 13    Duration Continue with 30 min of aerobic exercise without signs/symptoms of physical distress.    Intensity THRR unchanged      Progression   Progression Continue to progress workloads to maintain intensity without signs/symptoms of physical distress.      Resistance Training   Training Prescription Yes    Weight 4    Reps 10-15      Arm Ergometer   Level 2.1    Watts 18    RPM 50    Minutes 22    METs 2.2      T5 Nustep   Level 2    SPM 127    Minutes 17    METs 3.3           Nutrition:  Target Goals: Understanding of nutrition guidelines, daily intake of sodium '1500mg'$ , cholesterol '200mg'$ , calories 30% from fat and 7% or less from saturated fats, daily to have 5 or more servings of fruits and vegetables.  Biometrics:  Pre Biometrics - 11/16/19 1111      Pre Biometrics   Height '5\' 2"'$  (1.575 m)    Weight 101.8 kg    Waist Circumference 44 inches    Hip Circumference 45.5 inches    Waist to Hip Ratio 0.97 %    BMI (Calculated) 41.05    Triceps Skinfold 26 mm    % Body  Fat 48.4 %    Grip Strength 20.8 kg    Flexibility 7.6 in    Single Leg Stand 25 seconds           Post Biometrics - 03/22/20 1258       Post  Biometrics   Height '5\' 2"'$  (1.575 m)    Weight 106 kg    Waist Circumference 44 inches    Hip Circumference 48 inches    Waist to Hip Ratio 0.92 %    BMI (Calculated) 42.73    Triceps Skinfold 50 mm    % Body Fat 52.9 %    Grip Strength 28.23 kg    Flexibility 12.91 in    Single Leg Stand 28.05 seconds           Nutrition Therapy Plan and Nutrition Goals:  Nutrition Therapy & Goals - 02/29/20 1528      Personal Nutrition Goals   Comments We continue to work with patient and RD to schedule RD classes. Her medificts diet assessment score was 33. She continues to say she is trying to eat healthy. Will continue to monitor for progress.      Intervention Plan   Intervention Nutrition handout(s) given to patient.           Nutrition Assessments:  Nutrition Assessments - 03/29/20 1608      MEDFICTS Scores   Pre Score 33    Post Score 21    Score Difference -12           Nutrition Goals Re-Evaluation:   Nutrition Goals Discharge (Final Nutrition Goals  Re-Evaluation):   Psychosocial: Target Goals: Acknowledge presence or absence of significant depression and/or stress, maximize coping skills, provide positive support system. Participant is able to verbalize types and ability to use techniques and skills needed for reducing stress and depression.  Initial Review & Psychosocial Screening:  Initial Psych Review & Screening - 11/16/19 1120      Initial Review   Current issues with Current Anxiety/Panic   Due to not being able to breath post [place]? Yes      Barriers   Psychosocial barriers to participate in program There are no identifiable barriers or psychosocial needs.      Screening Interventions   Interventions Encouraged to exercise           Quality of Life  Scores:  Quality of Life - 03/22/20 1533      Quality of Life   Select Quality of Life      Quality of Life Scores   Health/Function Pre 20.88 %    Health/Function Post 15.72 %    Health/Function % Change -24.71 %    Socioeconomic Pre 20.25 %    Socioeconomic Post 15.44 %    Socioeconomic % Change  -23.75 %    Psych/Spiritual Pre 20.86 %    Psych/Spiritual Post 18 %    Psych/Spiritual % Change -13.71 %    Family Pre 20.8 %    Family Post 22.8 %    Family % Change 9.62 %    GLOBAL Pre 20.72 %    GLOBAL Post 17.08 %    GLOBAL % Change -17.57 %          Scores of 19 and below usually indicate a poorer quality of life in these areas.  A difference of  2-3 points is a clinically meaningful difference.  A difference of 2-3 points in the total score of the Quality of Life Index has been associated with significant improvement in overall quality of life, self-image, physical symptoms, and general health in studies assessing change in quality of life.   PHQ-9: Recent Review Flowsheet Data    Depression screen [facility] 2/9 03/29/2020 11/16/2019   Decreased Interest 0 0   Down, Depressed, Hopeless 0 0   PHQ - 2 Score 0 0   Altered sleeping 1 1   Tired, decreased energy 2 2   Change in appetite 0 1   Feeling bad or failure about yourself  0 0   Trouble concentrating 0 0   Moving slowly or fidgety/restless 0 0   Suicidal thoughts 0 0   PHQ-9 Score 3 4   Difficult doing work/chores Very difficult Somewhat difficult     Interpretation of Total Score  Total Score Depression Severity:  1-4 = Minimal depression, 5-9 = Mild depression, 10-14 = Moderate depression, 15-19 = Moderately severe depression, 20-27 = Severe depression   Psychosocial Evaluation and Intervention:  Psychosocial Evaluation - 03/29/20 1609      Discharge Psychosocial Assessment & Intervention   Comments Patient's discharge QOL score decreased by 17.55% at 17.08. Her PHQ-9 score went from 4 to 3. She has no psychosocial  issues identified at discharge. Her anxiety continues at times related to her SOB. Her mother was recently diagnosed with stage 4 pancreatic cancer which she attributes to her QOL scores.           Psychosocial Re-Evaluation:  Psychosocial Re-Evaluation    [place] Name 11/24/19 1541 12/15/19 1529 01/13/20  1416 02/03/20 1528 02/29/20 1528     Psychosocial Re-Evaluation   Current issues with Current Anxiety/Panic Current Anxiety/Panic Current Anxiety/Panic Current Anxiety/Panic None Identified   Comments Patient's initial QOL score was 20.72 and her PHQ-9 score was 4. She does report anxiety related to her SOB. She is currently not being treated for her anxiety. Will continue to monitor. Patient's initial QOL score was 20.72 and her PHQ-9 score was 4. She does report anxiety related to her SOB. She is currently not being treated for her anxiety. Will continue to monitor. Patient's initial QOL score was 20.72 and her PHQ-9 score was 4. She does report anxiety related to her SOB. She is currently not being treated for her anxiety. Will continue to monitor. Patient's initial QOL score was 20.72 and her PHQ-9 score was 4. She does report anxiety related to her SOB. She is currently not being treated for her anxiety. Will continue to monitor. Patient has no diagnosis of anxiety but states she was having some anxiety associated with her SOB. This is improving without treatment. Will continue to monitor.   Expected Outcomes Patient will have no additional psychosocial issues identified at discharge. Patient will have no additional psychosocial issues identified at discharge. Patient will have no additional psychosocial issues identified at discharge. Patient will have no additional psychosocial issues identified at discharge. Patient will have no additional psychosocial issues identified at discharge.   Interventions Relaxation education;Encouraged to attend Pulmonary Rehabilitation for the exercise;Stress  management education Relaxation education;Encouraged to attend Pulmonary Rehabilitation for the exercise;Stress management education Relaxation education;Encouraged to attend Pulmonary Rehabilitation for the exercise;Stress management education Relaxation education;Encouraged to attend Pulmonary Rehabilitation for the exercise;Stress management education Relaxation education;Encouraged to attend Pulmonary Rehabilitation for the exercise;Stress management education   Continue Psychosocial Services  Follow up required by staff Follow up required by staff No Follow up required No Follow up required No Follow up required          Psychosocial Discharge (Final Psychosocial Re-Evaluation):  Psychosocial Re-Evaluation - 02/29/20 1528      Psychosocial Re-Evaluation   Current issues with None Identified    Comments Patient has no diagnosis of anxiety but states she was having some anxiety associated with her SOB. This is improving without treatment. Will continue to monitor.    Expected Outcomes Patient will have no additional psychosocial issues identified at discharge.    Interventions Relaxation education;Encouraged to attend Pulmonary Rehabilitation for the exercise;Stress management education    Continue Psychosocial Services  No Follow up required            Education: Education Goals: Education classes will be provided on a weekly basis, covering required topics. Participant will state understanding/return demonstration of topics presented.  Learning Barriers/Preferences:  Learning Barriers/Preferences - 11/16/19 1004      Learning Barriers/Preferences   Learning Barriers None    Learning Preferences Pictoral;Group Instruction;Video;Individual Instruction;Skilled Demonstration           Education Topics: How Lungs Work and Diseases: - Discuss the anatomy of the lungs and diseases that can affect the lungs, such as COPD.   PULMONARY REHAB OTHER RESPIRATORY from 03/08/2020 in [place]  [facility]  Date 12/29/19  Educator D. Coad  Instruction Review Code 2- Demonstrated Understanding      Exercise: -Discuss the importance of exercise, FITT principles of exercise, normal and abnormal responses to exercise, and how to exercise safely.   Environmental Irritants: -Discuss types of environmental irritants and how to limit exposure to  environmental irritants.   Meds/Inhalers and oxygen: - Discuss respiratory medications, definition of an inhaler and oxygen, and the proper way to use an inhaler and oxygen.   PULMONARY REHAB OTHER RESPIRATORY from 03/08/2020 in [place] [facility]  Date 01/12/20  Educator DC      Energy Saving Techniques: - Discuss methods to conserve energy and decrease shortness of breath when performing activities of daily living.    Bronchial Hygiene / Breathing Techniques: - Discuss breathing mechanics, pursed-lip breathing technique,  proper posture, effective ways to clear airways, and other functional breathing techniques   Cleaning Equipment: - Provides group verbal and written instruction about the health risks of elevated stress, cause of high stress, and healthy ways to reduce stress.   PULMONARY REHAB OTHER RESPIRATORY from 03/08/2020 in [place] [facility]  Date 01/26/20  Educator DC  Instruction Review Code 2- Demonstrated Understanding      Nutrition I: Fats: - Discuss the types of cholesterol, what cholesterol does to the body, and how cholesterol levels can be controlled.   PULMONARY REHAB OTHER RESPIRATORY from 03/08/2020 in [place] [facility]  Date 02/09/20  Educator VH  Instruction Review Code 2- Demonstrated Understanding      Nutrition II: Labels: -Discuss the different components of food labels and how to read food labels.   PULMONARY REHAB OTHER RESPIRATORY from 03/08/2020 in [place] [facility]  Date 02/16/20  Educator DC  Instruction Review  Code 2- Demonstrated Understanding      Respiratory Infections: - Discuss the signs and symptoms of respiratory infections, ways to prevent respiratory infections, and the importance of seeking medical treatment when having a respiratory infection.   PULMONARY REHAB OTHER RESPIRATORY from 03/08/2020 in [place] [facility]  Date 11/24/19  Educator DLaural Benes  Instruction Review Code 2- Demonstrated Understanding      Stress I: Signs and Symptoms: - Discuss the causes of stress, how stress may lead to anxiety and depression, and ways to limit stress.   PULMONARY REHAB OTHER RESPIRATORY from 03/08/2020 in [place] [facility]  Date 12/01/19  Educator DC  Instruction Review Code 2- Demonstrated Understanding      Stress II: Relaxation: -Discuss relaxation techniques to limit stress.   PULMONARY REHAB OTHER RESPIRATORY from 03/08/2020 in [place] [facility]  Date 03/08/20  Educator DF  Instruction Review Code 2- Demonstrated Understanding      Oxygen for Home/Travel: - Discuss how to prepare for travel when on oxygen and proper ways to transport and store oxygen to ensure safety.   PULMONARY REHAB OTHER RESPIRATORY from 03/08/2020 in [place] [facility]  Date 12/15/19  Educator Timoteo Expose  Instruction Review Code 2- Demonstrated Understanding      Knowledge Questionnaire Score:  Knowledge Questionnaire Score - 03/29/20 1608      Knowledge Questionnaire Score   Pre Score 15/18    Post Score 16/18           Core Components/Risk Factors/Patient Goals at Admission:  Personal Goals and Risk Factors at Admission - 11/16/19 1125      Core Components/Risk Factors/Patient Goals on Admission    Weight Management Weight Maintenance    Personal Goal Other Yes    Personal Goal Breath better, Be able to get back to normal ADL's, Get back to her daily life and to be able to return to work.    Intervention Attend program 2 x week  and to supplement with 3 x week home exercise plan.  Expected Outcomes Reach expected goals.           Core Components/Risk Factors/Patient Goals Review:   Goals and Risk Factor Review    Row Name 11/24/19 1539 12/15/19 1526 01/13/20 1413 02/03/20 1528 02/29/20 1531     Core Components/Risk Factors/Patient Goals Review   Personal Goals Review Weight Management/Obesity  Get back to ADL's; and get back to her daily life. Breathe better. Weight Management/Obesity  Get bacl to ADL's; Get back to her daily life; breathe better. Weight Management/Obesity  Get back to ADL's; Get back to her daily life; breathe better. Weight Management/Obesity  Get back to ADL's; and get back to her daily life; breathe better. Weight Management/Obesity  Get back to ADL's and her daily life; breathe better.   Review Patient is new to the program. She has completed 3 sessions. She does say however that she is doing more at home since she has been attending the program because she is not as afraid to do more. Will continue to monitor for progress. Patient has completed 9 sessions gaining 3 llbs since she started the program. She is doing well in the program with some progression. She says she has good days and bad days with her breathing. She reports recently being exposed to yard mowing which has exacerbated her SOB and wheezing. She had to use her MDI during the session today. She still however feels like she is making progress in the program having more "good" days and attempting to do more at home. Will continue to monitor for progress. Patient has completed 16 sessions gaining 4 lbs since last 30 day review. She continues to do well in the program with progression. She reports doing more at home on the days she feels stronger. She feels like the program has given her more strength. Her SOB has improved. She is able to walk from her car in the parking lot into the building to the gym now without having to stop and rest of  getting extremely SOB. Will continue to monitor for progress. Patient has completed 22 sessions gaining 1 lb since last 30 day review. She continues to do well in the program with progression. She says she continues to do more at home on the days she feels stronger. She continues to have her days of increased SOB and fatigue. She continues to improve on her ability to walk into the building and to the gym without having to stop. She is doing well on her machines. She feels she is making progress. Will continue to monitor for progress. Patient has completed 28 sessions gaining 1 lb since last 30 day review. She continues to do well in the program with progression. She continues to try and do more at home but still has days of increased SOB and fatigue. She is doing well when she exercises and is able to walk into the building and into the department without having to stop and rest. She still feels like she is making progress in the program. Will continue to monitor for progress.   Expected Outcomes Patient will continue to attend sessions and complete the program meeting her personal goals. Patient will continue to attend sessions and complete the program meeting her personal goals. Patient will continue to attend sessions and complete the program meeting both personal and program goals. Patient will continue to attend sessions and complete the program meeting both personal and program goals. Patient will continue to attend sessions and complete the program meeting both personal  and program goals.   [place] Name 03/29/20 1612             Core Components/Risk Factors/Patient Goals Review   Personal Goals Review Weight Management/Obesity  Get back to ADL's and her daily life; breathe better.       Review Patient graduated with 36 sessions gaining 11 lbs since her initial visit. She did very well in the program. Her exit walk test increased by 28.57% and her exit measurements improved in grip strength, balance and  flexbility. She says she is able to walk longer distance now. She can walk for 10 minutes without having to stop due to SOB. She says she is able to do ADL's for 10 to 15 minutes now before she gets severly SOB. She was not able to do any ADL's before the program and only walk a very short distance. She is very pleased with her progress in the program and feels she has successfully met her goals. She plans to continue exercising at home by using weights and bands and walking. PR will f/u for one year.       Expected Outcomes Patient will continue exercising at home by using weights and bands and walking and continue to  meet her personal goals.              Core Components/Risk Factors/Patient Goals at Discharge (Final Review):   Goals and Risk Factor Review - 03/29/20 1612      Core Components/Risk Factors/Patient Goals Review   Personal Goals Review Weight Management/Obesity   Get back to ADL's and her daily life; breathe better.   Review Patient graduated with 36 sessions gaining 11 lbs since her initial visit. She did very well in the program. Her exit walk test increased by 28.57% and her exit measurements improved in grip strength, balance and flexbility. She says she is able to walk longer distance now. She can walk for 10 minutes without having to stop due to SOB. She says she is able to do ADL's for 10 to 15 minutes now before she gets severly SOB. She was not able to do any ADL's before the program and only walk a very short distance. She is very pleased with her progress in the program and feels she has successfully met her goals. She plans to continue exercising at home by using weights and bands and walking. PR will f/u for one year.    Expected Outcomes Patient will continue exercising at home by using weights and bands and walking and continue to  meet her personal goals.           ITP Comments:  ITP Comments    Row Name 11/16/19 1027           ITP Comments Patient is coming  to use post COVID-19 with pneumonia which has left her extremely SOB on exertion. Patient is eager to get started.              Comments: Patient graduated from Pulmonary Rehabilitation today on 03/29/20 after completing 36 sessions. She achieved LTG of 30 minutes of aerobic exercise at Max Met level of 3.4. All patients vitals are WNL.  Discharge instruction has been reviewed in detail and patient stated an understanding of material given. Patient plans to exercise at home by using weights and bands and walking. Pulmonary Rehab staff will make f/u calls at 1 month, 6 months, and 1 year. Patient had no complaints of any abnormal S/S or pain on their  exit visit.

## 2020-04-03 ENCOUNTER — Encounter: Payer: Self-pay | Admitting: Pulmonary Disease

## 2020-04-03 ENCOUNTER — Ambulatory Visit (INDEPENDENT_AMBULATORY_CARE_PROVIDER_SITE_OTHER): Payer: No Typology Code available for payment source | Admitting: Pulmonary Disease

## 2020-04-03 ENCOUNTER — Encounter: Payer: Self-pay | Admitting: *Deleted

## 2020-04-03 ENCOUNTER — Other Ambulatory Visit: Payer: Self-pay

## 2020-04-03 VITALS — BP 136/82 | HR 88 | Temp 98.2°F | Ht 62.0 in | Wt 234.6 lb

## 2020-04-03 DIAGNOSIS — J383 Other diseases of vocal cords: Secondary | ICD-10-CM | POA: Diagnosis not present

## 2020-04-03 DIAGNOSIS — G4733 Obstructive sleep apnea (adult) (pediatric): Secondary | ICD-10-CM | POA: Diagnosis not present

## 2020-04-03 DIAGNOSIS — J4542 Moderate persistent asthma with status asthmaticus: Secondary | ICD-10-CM

## 2020-04-03 NOTE — Patient Instructions (Addendum)
Continue tapering the prednisone.  Go to 2.5 mg for 1 week and then stop altogether We will increase omeprazole to 40 mg twice daily for 2 weeks.  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 Everardo All

## 2020-04-03 NOTE — Progress Notes (Signed)
Jane Bennett    [id number]    [birth year]  Primary Care Physician:[facility]  Referring Physician: [facility] [address] 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  She did not require hospitalization Has developed symptoms of persistent wheezing, dyspnea since then Work-up including PFTs showing obstruction with bronchodilator response and paradoxical vocal cord motion on ENT examination She has been treated with Advair, PPI, prednisone with minimal improvement in symptoms  Referred to speech therapy for evaluation and treatment of vocal cord issues but has been denied by [employer] as it was not felt to be related to COVID-19.  She recently had a high-resolution CT with no evidence of interstitial lung disease  Chief complaint today is ongoing dyspnea, wheezing, marked upper airway stridor.  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  No mold, hot tub, Jacuzzi.  No down pillows or comforters Smoking history: Never smoker Travel history: No significant travel history Relevant family history: No significant family issue of lung disease  Outpatient Encounter Medications as of 04/03/2020  Medication Sig  . albuterol (VENTOLIN HFA) 108 (90 Base) MCG/ACT inhaler INHALE 1 TO 2 PUFFS BY MOUTH EVERY 4 TO 6 HOURS  . cetirizine (ZYRTEC) 10 MG tablet Take 1 tablet by mouth once daily  . famotidine (PEPCID) 20 MG tablet Take 1 tablet (20 mg total) by mouth at bedtime.  . fluticasone (FLONASE) 50 MCG/ACT nasal spray Place 1 spray into both nostrils daily.  . fluticasone-salmeterol (ADVAIR HFA) 230-21 MCG/ACT inhaler Inhale 2 puffs into the lungs 2 (two) times daily.  Marland Kitchen  losartan-hydrochlorothiazide (HYZAAR) 50-12.5 MG tablet Take 1 tablet by mouth daily.  Marland Kitchen omeprazole (PRILOSEC) 20 MG capsule Take 1 capsule (20 mg total) by mouth daily.  . ondansetron (ZOFRAN-ODT) 8 MG disintegrating tablet Take 8 mg by mouth every 6 (six) hours as needed.  . predniSONE (DELTASONE) 5 MG tablet Take 1 tablet (5 mg total) by mouth daily with breakfast. Until next follow-up  . Tiotropium Bromide Monohydrate (SPIRIVA RESPIMAT) 1.25 MCG/ACT AERS Inhale 2 puffs into the lungs daily.   No facility-administered encounter medications on file as of 04/03/2020.   Physical Exam: Blood pressure 136/82, pulse 88, temperature 98.2 F (36.8 C), temperature source Oral, height 5\' 2"  (1.575 m), weight 234 lb 9.6 oz (106.4 kg), SpO2 99 %. Gen:      No acute distress HEENT:  EOMI, sclera anicteric, upper airway stridor Neck:     No masses; no thyromegaly Lungs:    Clear to auscultation bilaterally; normal respiratory effort CV:         Regular rate and rhythm; no murmurs Abd:      + bowel sounds; soft, non-tender; no palpable masses, no distension Ext:    No edema; adequate peripheral perfusion Skin:      Warm and dry; no rash Neuro: alert and oriented x 3 Psych: normal mood and affect  Data Reviewed: Imaging: HRCT 03/30/2020 There is no evidence of interstitial lung disease.  Mild diffuse central airway thickening is noted which can be seen with asthma or bronchitis.   PFTs: 01/06/2020 FVC 1.27 [39%], FEV1 1.06 [41%], F/F 84 TLC 3.97 [83%], DLCO 20.20 [104%]  Labs: IgE 01/06/2020- 18 CBC  10/11/2019-WBC 8.7, eos 2%, absolute eosinophil count 174  Assessment:  Assessment for post Covid ILD She had a recent HRCT.  I do not have the images to review as it was done at [facility]  At least per report there is no evidence of interstitial lung disease No evidence of restriction on PFTs  Asthma, VCD Suspect her main issue is asthma with VCD which can frequently occur together.  PFTs reviewed  with significant bronchodilator response.  She will need to be seen by speech pathology however it is denied by work comp as this is likely not related to COVID-19 We will try to get this done through her regular insurance. Continue Advair. Wean off prednisone as I do not think it is helping  She will also need optimal management of coexisting GERD, obesity and likely undiagnosed sleep apnea which are also playing a significant role in presentation  Plan/Recommendations: Refer back to speech pathology Taper prednisone to 2.5 mg for 1 week and then stop Continue Advair Increase omeprazole to 40 mg twice daily for 2 weeks.  Then continue at 20 mg during the day and Pepcid at night Home sleep study Weight loss  Chilton Greathouse MD  Pulmonary and Critical Care 04/03/2020, 10:40 AM  CC: [facility]*

## 2020-04-06 ENCOUNTER — Telehealth: Payer: Self-pay | Admitting: Pulmonary Disease

## 2020-04-06 NOTE — Telephone Encounter (Signed)
Dr. Isaiah Serge please advise if ok to type new work note stating that patient is out of work but not due to [employer].  Thanks!

## 2020-04-11 NOTE — Telephone Encounter (Signed)
Dr. Isaiah Serge please advise if ok to type work note as requested. Thanks!

## 2020-04-11 NOTE — Telephone Encounter (Signed)
Aurther Loft calling back to state that PM said pt needs to be out of work,but symptoms are not related to covid. Need note to say excuse her from work  as symptoms are not related to covid. Aurther Loft can be reached at  [phone].

## 2020-04-11 NOTE — Telephone Encounter (Signed)
Jane Bennett - pt's case manager - sent another email checking on status of updated work note -pr

## 2020-04-11 NOTE — Telephone Encounter (Signed)
Yes. It is ok to give the letter

## 2020-04-11 NOTE — Telephone Encounter (Signed)
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  Need to find out exactly what is being asked as far as what's on the work note.

## 2020-04-11 NOTE — Telephone Encounter (Signed)
Letter has been typed and faxed as requested.  Nothing further needed at this time- will close encounter.

## 2020-05-04 ENCOUNTER — Ambulatory Visit (INDEPENDENT_AMBULATORY_CARE_PROVIDER_SITE_OTHER): Payer: No Typology Code available for payment source | Admitting: Pulmonary Disease

## 2020-05-04 ENCOUNTER — Other Ambulatory Visit: Payer: Self-pay

## 2020-05-04 ENCOUNTER — Encounter: Payer: Self-pay | Admitting: *Deleted

## 2020-05-04 ENCOUNTER — Encounter: Payer: Self-pay | Admitting: Pulmonary Disease

## 2020-05-04 VITALS — BP 128/82 | HR 102 | Temp 97.8°F | Ht 62.0 in | Wt 232.0 lb

## 2020-05-04 DIAGNOSIS — J383 Other diseases of vocal cords: Secondary | ICD-10-CM

## 2020-05-04 DIAGNOSIS — J4550 Severe persistent asthma, uncomplicated: Secondary | ICD-10-CM

## 2020-05-04 DIAGNOSIS — Z6841 Body Mass Index (BMI) 40.0 and over, adult: Secondary | ICD-10-CM

## 2020-05-04 MED ORDER — ALBUTEROL SULFATE (2.5 MG/3ML) 0.083% IN NEBU: 2.5000 mg | INHALATION_SOLUTION | Freq: Four times a day (QID) | RESPIRATORY_TRACT | 12 refills | Status: AC | PRN

## 2020-05-04 MED ORDER — FLUTICASONE-SALMETEROL 500-50 MCG/DOSE IN AEPB: 1.0000 | INHALATION_SPRAY | Freq: Two times a day (BID) | RESPIRATORY_TRACT | 2 refills | Status: DC

## 2020-05-04 NOTE — Progress Notes (Signed)
         TEQUILA ROTTMANN    [id number]    [birth year]  Primary Care Physician:[facility]  Referring Physician: [facility] [address] [place],  [place] [address]  Chief complaint: Follow-up asthma and vocal cord dysfunction  HPI: [name] is a 54 year old female previously diagnosed with COVID in 08/2018 who initially presented to Pulmonary clinic for shortness of breath and wheezing. Work-up revealed a diagnosis of  severe persistent asthma and vocal cord dysfunction. She has been compliant with her ICS/LABA inhaler without any improvement to her wheezing or shortness of breath. Her VCD exacerbates her breathing. She has been contacted by Speech to schedule therapy and plans to start this month.  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  No mold, hot tub, Jacuzzi.  No down pillows or comforters Smoking history: Never smoker Travel history: No significant travel history Relevant family history: No significant family issue of lung disease  Outpatient Encounter Medications as of 05/04/2020  Medication Sig  . albuterol (VENTOLIN HFA) 108 (90 Base) MCG/ACT inhaler INHALE 1 TO 2 PUFFS BY MOUTH EVERY 4 TO 6 HOURS  . cetirizine (ZYRTEC) 10 MG tablet Take 1 tablet by mouth once daily  . famotidine (PEPCID) 20 MG tablet Take 1 tablet (20 mg total) by mouth at bedtime.  . fluticasone (FLONASE) 50 MCG/ACT nasal spray Place 1 spray into both nostrils daily.  . fluticasone-salmeterol (ADVAIR HFA) 230-21 MCG/ACT inhaler Inhale 2 puffs into the lungs 2 (two) times daily.  Marland Kitchen losartan-hydrochlorothiazide (HYZAAR) 50-12.5 MG tablet Take 1 tablet by mouth daily.  Marland Kitchen omeprazole (PRILOSEC) 20 MG capsule Take 1 capsule (20 mg total) by mouth daily.  . ondansetron (ZOFRAN-ODT) 8 MG disintegrating tablet Take 8 mg by mouth every 6 (six) hours as needed.  . Tiotropium Bromide Monohydrate (SPIRIVA RESPIMAT)  1.25 MCG/ACT AERS Inhale 2 puffs into the lungs daily.  . [DISCONTINUED] predniSONE (DELTASONE) 5 MG tablet Take 1 tablet (5 mg total) by mouth daily with breakfast. Until next follow-up   No facility-administered encounter medications on file as of 05/04/2020.   Physical Exam: General: Obese, well-appearing, no acute distress HENT: [place], AT Eyes: EOMI, no scleral icterus Respiratory: Upper airway stridor. Clear to auscultation bilaterally.  No crackles, wheezing or rales Cardiovascular: RRR, -M/R/G, no JVD GI: BS+, soft, nontender Extremities:-Edema,-tenderness Neuro: AAO x4, CNII-XII grossly intact Skin: Intact, no rashes or bruising Psych: Normal mood, normal affect  Data Reviewed: Imaging: HRCT 03/30/2020 There is no evidence of interstitial lung disease.  Mild diffuse central airway thickening is noted which can be seen with asthma or bronchitis.   PFTs: 01/06/2020 FVC 1.27 [39%], FEV1 1.06 [41%], F/F 84 TLC 3.97 [83%], DLCO 20.20 [104%]  Labs: IgE 01/06/2020- 18 CBC 10/11/2019-WBC 8.7, eos 2%, absolute eosinophil count 174  Assessment:   Severe persistent asthma - uncontrolled --INCREASE inhaled steroid dosing. Will START WIXELA 500-50 mcg ONE puff TWICE a day --START Albuterol nebulizer as needed for shortness of breath or wheezing  Vocal cord dysfunction --Patient contacted by Speech to schedule therapy this month --CONTINUE omeprazole and pepcid  Obesity --Discussed weight loss  GERD --Continue PPI and Pepcid daily  High risk for OSA --Sleep study ordered or prior visit  Mechele Collin, M.D. [facility] Pulmonary/Critical Care Medicine 05/04/2020 11:08 AM

## 2020-05-04 NOTE — Progress Notes (Deleted)
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  She did not require hospitalization Has developed symptoms of persistent wheezing, dyspnea since then Work-up including PFTs showing obstruction with bronchodilator response and paradoxical vocal cord motion on ENT examination She has been treated with Advair, PPI, prednisone with minimal improvement in symptoms  Referred to speech therapy for evaluation and treatment of vocal cord issues but has been denied by [employer] as it was not felt to be related to COVID-19.  She recently had a high-resolution CT with no evidence of interstitial lung disease  Chief complaint today is ongoing dyspnea, wheezing, marked upper airway stridor.  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  No mold, hot tub, Jacuzzi.  No down pillows or comforters Smoking history: Never smoker Travel history: No significant travel history Relevant family history: No significant family issue of lung disease  Outpatient Encounter Medications as of 05/04/2020  Medication Sig  . albuterol (VENTOLIN HFA) 108 (90 Base) MCG/ACT inhaler INHALE 1 TO 2 PUFFS BY MOUTH EVERY 4 TO 6 HOURS  . cetirizine (ZYRTEC) 10 MG tablet Take 1 tablet by mouth once daily  . famotidine (PEPCID) 20 MG tablet Take 1 tablet (20 mg total) by mouth at bedtime.  . fluticasone (FLONASE) 50 MCG/ACT nasal spray Place 1 spray into both nostrils daily.  . fluticasone-salmeterol (ADVAIR HFA) 230-21 MCG/ACT inhaler Inhale 2 puffs into the lungs 2 (two) times daily.  Marland Kitchen  losartan-hydrochlorothiazide (HYZAAR) 50-12.5 MG tablet Take 1 tablet by mouth daily.  Marland Kitchen omeprazole (PRILOSEC) 20 MG capsule Take 1 capsule (20 mg total) by mouth daily.  . ondansetron (ZOFRAN-ODT) 8 MG disintegrating tablet Take 8 mg by mouth every 6 (six) hours as needed.  . Tiotropium Bromide Monohydrate (SPIRIVA RESPIMAT) 1.25 MCG/ACT AERS Inhale 2 puffs into the lungs daily.  . [DISCONTINUED] predniSONE (DELTASONE) 5 MG tablet Take 1 tablet (5 mg total) by mouth daily with breakfast. Until next follow-up   No facility-administered encounter medications on file as of 05/04/2020.   Physical Exam: Blood pressure 136/82, pulse 88, temperature 98.2 F (36.8 C), temperature source Oral, height 5\' 2"  (1.575 m), weight 234 lb 9.6 oz (106.4 kg), SpO2 99 %. Gen:      No acute distress HEENT:  EOMI, sclera anicteric, upper airway stridor Neck:     No masses; no thyromegaly Lungs:    Clear to auscultation bilaterally; normal respiratory effort CV:         Regular rate and rhythm; no murmurs Abd:      + bowel sounds; soft, non-tender; no palpable masses, no distension Ext:    No edema; adequate peripheral perfusion Skin:      Warm and dry; no rash Neuro: alert and oriented x 3 Psych: normal mood and affect  Data Reviewed: Imaging: HRCT 03/30/2020 There is no evidence of interstitial lung disease.  Mild diffuse central airway thickening is noted which can be seen with asthma or bronchitis.   PFTs: 01/06/2020 FVC 1.27 [39%], FEV1 1.06 [41%], F/F 84 TLC 3.97 [83%], DLCO 20.20 [104%]  Labs: IgE 01/06/2020- 18  CBC 10/11/2019-WBC 8.7, eos 2%, absolute eosinophil count 174  Assessment:  54 year old female with  She had a recent HRCT.  I do not have the images to review as it was done at [facility].  At least per report there is no evidence of interstitial lung disease No evidence of restriction on PFTs  Asthma, VCD Suspect her main issue is asthma with VCD which can frequently occur together.  PFTs  reviewed with significant bronchodilator response.  She will need to be seen by speech pathology however it is denied by work comp as this is likely not related to COVID-19 We will try to get this done through her regular insurance. Continue Advair. Wean off prednisone as I do not think it is helping   She will also need optimal management of coexisting GERD, obesity and likely undiagnosed sleep apnea which are also playing a significant role in presentation  Plan/Recommendations: Refer back to speech pathology Taper prednisone to 2.5 mg for 1 week and then stop Continue Advair Increase omeprazole to 40 mg twice daily for 2 weeks.  Then continue at 20 mg during the day and Pepcid at night Home sleep study Weight loss  Severe persistent asthma - uncontrolled --INCREASE inhaled steroid dosing. Will START WIXELA 500-50 mcg ONE puff TWICE a day --START Albuterol nebulizer as needed for shortness of breath or wheezing  Vocal cord dysfunction --Patient contacted by Speech to schedule therapy this month --CONTINUE omeprazole and pepcid  Obesity --Discussed weight loss  Mechele Collin, M.D. [facility] Pulmonary/Critical Care Medicine 05/04/2020 11:08 AM

## 2020-05-04 NOTE — Patient Instructions (Addendum)
Severe persistent asthma - uncontrolled --INCREASE inhaled steroid dosing. Will START WIXELA 500-50 mcg ONE puff TWICE a day --START Albuterol nebulizer as needed for shortness of breath or wheezing  Vocal cord dysfunction --Patient contacted by Speech to schedule therapy this month --CONTINUE omeprazole and pepcid  Obesity --Discussed weight loss  Follow-up in one month

## 2020-05-16 ENCOUNTER — Telehealth: Payer: Self-pay | Admitting: Pulmonary Disease

## 2020-05-17 NOTE — Telephone Encounter (Signed)
Pt's OV notes from July and August have been faxed to provided fax number in attn [employer]. Nothing further needed.

## 2020-05-22 ENCOUNTER — Ambulatory Visit (HOSPITAL_COMMUNITY): Payer: No Typology Code available for payment source | Attending: Pulmonary Disease | Admitting: Speech Pathology

## 2020-05-22 ENCOUNTER — Other Ambulatory Visit: Payer: Self-pay

## 2020-05-22 DIAGNOSIS — J383 Other diseases of vocal cords: Secondary | ICD-10-CM | POA: Diagnosis not present

## 2020-05-23 ENCOUNTER — Telehealth: Payer: Self-pay | Admitting: Pulmonary Disease

## 2020-05-23 NOTE — Telephone Encounter (Signed)
calle pt back and got insurance info to do a precert for [employer]

## 2020-05-25 ENCOUNTER — Encounter (HOSPITAL_COMMUNITY): Payer: Self-pay | Admitting: Speech Pathology

## 2020-05-25 NOTE — Therapy (Signed)
[facility] [address] [place], [place], [address] Phone: [phone]   Fax:  [phone]  Speech Language Pathology Evaluation  Patient Details  Name: Jane Bennett MRN: [id number] Date of Birth: 1966-07-17 Referring Provider (SLP): Chilton Greathouse, MD (Dr. Luciano Cutter - Pulmonologist)   Encounter Date: 05/22/2020   End of Session - 05/22/20 0819    Visit Number 1    Number of Visits 12    Authorization Type Self Pay; reports private insurance    SLP Start Time 1550    SLP Stop Time  1640    SLP Time Calculation (min) 50 min    Activity Tolerance Patient tolerated treatment well           Past Medical History:  Diagnosis Date  . Asthma   . DVT (deep venous thrombosis) (HCC)   . High cholesterol   . Pneumonia     Past Surgical History:  Procedure Laterality Date  . CHOLECYSTECTOMY      There were no vitals filed for this visit.   Subjective Assessment - 05/22/20 0826    Subjective "I am so frustrated with my breathing and my voice, I am just hoping ST can provide some relief"    Currently in Pain? No/denies              SLP Evaluation [facility] - 05/22/20 0001      SLP Visit Information   SLP Received On 05/22/20    Referring Provider (SLP) Chilton Greathouse, MD   Dr. Luciano Cutter - Pulmonologist   Onset Date 08/2019   Pt had COVID   Medical Diagnosis Vocal Cord Dysfunction      Subjective   Patient/Family Stated Goal Assistance with the [facility] issue      General Information   HPI Jane Bennett is a 54 y/o female who was diagnosed with COVID-19 in December of 2020. She has experienced post COVID asthma, Pulmonary disease and Vocal Cord Dysfunction (VCD) since the onset of COVID. Prior to this diagnosis, she was healthy with no pulmonary disease, shortness of breath or voice difficulties. No hx of smoking or drinking alcohol. Pt reports her voice comes and goes even mid sentence; further reports cough drops help  with moisture and decreasing vocal irritation.       Balance Screen   Has the patient fallen in the past 6 months No    Has the patient had a decrease in activity level because of a fear of falling?  No    Is the patient reluctant to leave their home because of a fear of falling?  No      Prior Functional Status   Cognitive/Linguistic Baseline Within functional limits    Type of Home House     Lives With Spouse;Son   2 sons   Available Support Family    Vocation Workers comp      Cognition   Overall Cognitive Status Within Functional Limits for tasks assessed      Auditory Comprehension   Overall Auditory Comprehension Appears within functional limits for tasks assessed      Oral Motor/Sensory Function   Overall Oral Motor/Sensory Function Appears within functional limits for tasks assessed      Motor Speech   Overall Motor Speech Appears within functional limits for tasks assessed             SLP Education - 05/22/20 0818    Education Details Provided written handouts of "Vocal  Hygiene Protocol" and "Ten Steps to [facility] Your Voice"    Person(s) Educated Patient    Methods Explanation    Comprehension Verbalized understanding            SLP Short Term Goals - 05/22/20 0825      SLP SHORT TERM GOAL #2   Title Pt will report daily usage of Vocal Hygiene Protocol and Vocal "Steps to [facility] your Voice" provided by SLP    Status New            SLP Long Term Goals - 05/22/20 0825      SLP LONG TERM GOAL #1   Title Pt will complete evaluation at [facility] health with SLP Voice Specialist Amy Morris to determine POC            Plan - 05/22/20 0820    Clinical Impression Statement Perceptual Assessment: Jane Bennett's vocal quality was low in pitch, with a gravely hoarse quality. Intermittent roughness and laryngeal strain were identified, particularly at the end of phrases as breath support decreased. There were reduced words per breath (sometimes3-4 words  per breath). Inhalation was striderous, as previously reported by fiberoptic exam on 11/30/2019 completed by Dr. Jenne Pane. Resonance was mildly pressed. Breath support during conversation was suboptimal marked by shallow inhalation and use of functional residual capacity. Jane Bennett is a good candidate for therapy, which is medically necessary for the patient's voice disorder. 1/wk X12 weeks to be initiated following Pt completing a comprehensive Voice Evaluation at [facility] with Amy Morris. Plan of care established at that evaluation may then be completed here at [facility] as this location is more convenient for the Pt.   Further Reported and pertinent Vocal hygiene and VCD hx: Hydration is reported 48-64 oz per day. Pt reports when she gets hot that she becomes very short of breath and getting hot also triggers a coughing episode which often lasts for 5-10 mins. Pt reports occasionally getting choked when drinking water also (consider dysphagia evaluation). Pt reports strong smells including candles, perfume and various cleaners are very triggering for her VCD and coughing episodes. She reports when she needs to tell her sons something and they are in another room she texts him rather than raising her voice. Pt achieved a 38 on the Voice Handicap Index (VHI) 10 (reported in detail below)   Speech Therapy Frequency 1x /week    Duration 12 weeks    Treatment/Interventions Compensatory techniques;Compensatory strategies;SLP instruction and feedback;Other (comment)   Voice therapy   Potential to Achieve Goals Fair    SLP Home Exercise Plan Vocal Hygiene Protocol and Vocal precautions provided    Consulted and Agree with Plan of Care Patient          The Voice Handicap Index-10 (VHI-10) was administered. This survey is a series of questions targeting the patient's perception of his/her own voice using a scale of 0-4 (0=Never, 4=Always). Score greater than  11 is abnormal. My voice makes it difficult for people to hear me. 4  People have difficulty understanding me in a noisy room. 4 My voice difficulties restrict personal and social life. 4 I feel left out of conversations because of my voice. 2  My voice problem causes me to lose income. 4 I feel as though I have to strain to produce voice. 2  The clarity of my voice is unpredictable. 4 My voice problem upsets me. 4 My voice makes me feel handicapped.  4  People ask, "What's wrong with your voice?" 4  TOTAL SCORE: 38 [x]  Abnormal (raw score >11)  Patient will benefit from skilled therapeutic intervention in order to improve the following deficits and impairments:   Vocal cord dysfunction - Plan: SLP plan of care cert/re-cert    Problem List Patient Active Problem List   Diagnosis Date Noted  . Class 3 severe obesity with body mass index (BMI) of 40.0 to 44.9 in adult (HCC) 05/05/2020  . GERD (gastroesophageal reflux disease) 03/16/2020  . Seasonal allergies 01/06/2020  . Vocal cord dysfunction 01/06/2020  . History of COVID-19 11/02/2019  . Physical deconditioning 11/02/2019   Shep Porter H. 12/31/2019, CCC-SLP Speech Language Pathologist  Romie Levee 05/25/2020, 8:32 AM  [facility] [address] [place], [place], [place] Phone: [phone]   Fax:  [phone]  Name: Jane Bennett MRN: Alberteen Sam Date of Birth: 11/11/1965

## 2020-05-30 ENCOUNTER — Ambulatory Visit: Payer: Self-pay | Admitting: Pulmonary Disease

## 2020-05-30 ENCOUNTER — Encounter: Payer: Self-pay | Admitting: Pulmonary Disease

## 2020-05-30 ENCOUNTER — Ambulatory Visit: Payer: No Typology Code available for payment source

## 2020-05-30 ENCOUNTER — Telehealth (HOSPITAL_COMMUNITY): Payer: Self-pay | Admitting: Speech Pathology

## 2020-05-30 ENCOUNTER — Ambulatory Visit (INDEPENDENT_AMBULATORY_CARE_PROVIDER_SITE_OTHER): Payer: No Typology Code available for payment source | Admitting: Pulmonary Disease

## 2020-05-30 ENCOUNTER — Other Ambulatory Visit: Payer: Self-pay

## 2020-05-30 VITALS — BP 112/70 | HR 96 | Temp 98.5°F | Ht 62.0 in | Wt 230.4 lb

## 2020-05-30 DIAGNOSIS — Z0000 Encounter for general adult medical examination without abnormal findings: Secondary | ICD-10-CM | POA: Insufficient documentation

## 2020-05-30 DIAGNOSIS — Z9189 Other specified personal risk factors, not elsewhere classified: Secondary | ICD-10-CM

## 2020-05-30 DIAGNOSIS — J4550 Severe persistent asthma, uncomplicated: Secondary | ICD-10-CM | POA: Insufficient documentation

## 2020-05-30 DIAGNOSIS — J383 Other diseases of vocal cords: Secondary | ICD-10-CM | POA: Diagnosis not present

## 2020-05-30 DIAGNOSIS — K219 Gastro-esophageal reflux disease without esophagitis: Secondary | ICD-10-CM

## 2020-05-30 DIAGNOSIS — G4733 Obstructive sleep apnea (adult) (pediatric): Secondary | ICD-10-CM | POA: Insufficient documentation

## 2020-05-30 DIAGNOSIS — Z8616 Personal history of COVID-19: Secondary | ICD-10-CM

## 2020-05-30 NOTE — Assessment & Plan Note (Signed)
Plan: Continue Wixela 500 May need to consider decreasing ICS/LABA after further evaluation by [facility] team Continue Spiriva Respimat 1.25 Continue Zyrtec Continue Flonase Continue albuterol nebulized meds Continue to work on increasing overall physical mobility and reducing BMI

## 2020-05-30 NOTE — Assessment & Plan Note (Signed)
Plan: Continue omeprazole Continue Pepcid  

## 2020-05-30 NOTE — Progress Notes (Signed)
@Patient  ID: Alberteen SamAnn P Beaudry, female    DOB: 03/07/1966, 54 y.o.   MRN: [id number]  Chief Complaint  Patient presents with  . Follow-up    will do her sleep study tonight.  did do speech therapy but told she will need to go to [facility] for further testing.     Referring provider: [facility], [facility]  HPI:  54 year old female never smoker found office for asthma, history of COVID-19, vocal cord dysfunction  PMH: Clear history of COVID-19, obesity, GERD Smoker/ Smoking History: Never Maintenance:  Wixella 500 Pt of: Dr. Everardo AllEllison   05/30/2020  - Visit   54 year old female never smoker followed in our office for asthma with a history of COVID-19 and vocal cord dysfunction.  She is followed in our office by Dr. Everardo AllEllison.  Dr. Everardo AllEllison last saw the patient in August/2021.  Plan of care at that time was for the patient to start Wixela 500 twice daily.  Start speech therapy.  Continue omeprazole and Pepcid, work on weight loss obtain home sleep study. Patient has been evaluated by ENT and showed paradoxical vocal cord motion on exam.  Patient presenting to office today reporting that she continues to have persistent upper airway wheeze.  She was [facility] to be [facility] by Friday of last week.  She has not received a call.  She plans on giving them a call today.  She does feel that the Wixela 500 has helped.  She continues to use Wixela 500 and Spiriva Respimat 1.25.  The greatest benefit is the albuterol nebulizers.  She is using these 2-3 times a day.  Patient continues to struggle with physical exertion as well as moving around.  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  She is working with a Sports coachcase manager for [employer].  Case manager is present in our office visit today.  Patient was ambulated in office today and oxygen levels dropped to 86% on room air.  Patient did  have quick recovery.  Patient was quite fatigued during the short distance.  Patient was unable to complete 1 lap in the office.  Patient plans on completing a home sleep study tonight.  Questionaires / Pulmonary Flowsheets:   ACT:  Asthma Control Test ACT Total Score  05/04/2020 7    MMRC: mMRC Dyspnea Scale mMRC Score  05/30/2020 3  04/03/2020 1    Epworth:  No flowsheet data found.  Tests:   Imaging: HRCT 03/30/2020 There is no evidence of interstitial lung disease.  Mild diffuse central airway thickening is noted which can be seen with asthma or bronchitis.   PFTs: 01/06/2020 FVC 1.27 [39%], FEV1 1.06 [41%], F/F 84 TLC 3.97 [83%], DLCO 20.20 [104%]  Labs: IgE 01/06/2020- 18 CBC 10/11/2019-WBC 8.7, eos 2%, absolute eosinophil count 174  09/15/2019-SARS-CoV-2-positive   FENO:  No results found for: NITRICOXIDE  PFT: PFT Results Latest Ref Rng & Units 01/06/2020  FVC-Pre L 1.20  FVC-Predicted Pre % 37  FVC-Post L 1.27  FVC-Predicted Post % 39  Pre FEV1/FVC % % 70  Post FEV1/FCV % % 84  FEV1-Pre L 0.84  FEV1-Predicted Pre % 33  FEV1-Post L 1.06  DLCO uncorrected ml/min/mmHg 20.20  DLCO UNC% % 104  DLCO corrected ml/min/mmHg 20.20  DLCO COR %Predicted % 104  DLVA Predicted % 133  TLC L 3.97  TLC % Predicted % 83  RV %  Predicted % 91    WALK:  SIX MIN WALK 05/30/2020 03/22/2020 11/02/2019  Supplimental Oxygen during Test? (L/min) No - No  2 Minute Oxygen Saturation % - 95 -  2 Minute HR - 113 -  4 Minute Oxygen Saturation % - 95 -  4 Minute HR - 112 -  6 Minute Oxygen Saturation % - 96 -  6 Minute HR - 110 -  Tech Comments: pt could not complete 1 full lap.  she did about 1/4 of a lap and dropped to 84% on room air.  she did a quick recovery once seated. - Patient walked at an average pace only able to complete one lap. Patient stopped three times while doing the lap to cough and to catch her breath. Patient had complaints of severe SOB and chest tightness and  also wheezed the entire time while walking.    Imaging: No results found.  Lab Results:  CBC    Component Value Date/Time   WBC 8.7 10/11/2019 2010   RBC 4.80 10/11/2019 2010   HGB 13.5 10/11/2019 2010   HCT 42.8 10/11/2019 2010   PLT 223 10/11/2019 2010   MCV 89.2 10/11/2019 2010   MCH 28.1 10/11/2019 2010   MCHC 31.5 10/11/2019 2010   RDW 15.1 10/11/2019 2010   LYMPHSABS 3.5 10/11/2019 2010   MONOABS 0.5 10/11/2019 2010   EOSABS 0.2 10/11/2019 2010   BASOSABS 0.1 10/11/2019 2010    BMET    Component Value Date/Time   NA 139 01/06/2020 1122   K 4.0 01/06/2020 1122   CL 101 01/06/2020 1122   CO2 30 01/06/2020 1122   GLUCOSE 96 01/06/2020 1122   BUN 13 01/06/2020 1122   CREATININE 0.85 01/06/2020 1122   CALCIUM 9.6 01/06/2020 1122   GFRNONAA >60 10/11/2019 2010   GFRAA >60 10/11/2019 2010    BNP No results found for: BNP  ProBNP No results found for: PROBNP  Specialty Problems      Pulmonary Problems   Vocal cord dysfunction    Patient evaluated in March/2021 at [facility] with Dr. Jenne Pane.  Findings were positive for paradoxical vocal cord motion during relaxed breathing felt to be contributing to her shortness of breath Referred to speech pathology, now referred [facility]      Severe persistent asthma without complication      Allergies  Allergen Reactions  . Other Hives  . Sulfa Antibiotics Hives  . Bee Venom Hives    Immunization History  Administered Date(s) Administered  . Influenza Whole 07/04/2019   Would recommend COVID-19 vaccinations  Past Medical History:  Diagnosis Date  . Asthma   . DVT (deep venous thrombosis) (HCC)   . High cholesterol   . Pneumonia     Tobacco History: Social History   Tobacco Use  Smoking Status Never Smoker  Smokeless Tobacco Never Used   Counseling given: Yes   Continue to not smoke  Outpatient Encounter Medications as of 05/30/2020  Medication Sig  . albuterol  (PROVENTIL) (2.5 MG/3ML) 0.083% nebulizer solution Take 3 mLs (2.5 mg total) by nebulization every 6 (six) hours as needed for wheezing or shortness of breath.  Marland Kitchen albuterol (VENTOLIN HFA) 108 (90 Base) MCG/ACT inhaler INHALE 1 TO 2 PUFFS BY MOUTH EVERY 4 TO 6 HOURS  . cetirizine (ZYRTEC) 10 MG tablet Take 1 tablet by mouth once daily  . famotidine (PEPCID) 20 MG tablet Take 1 tablet (20 mg total) by mouth at bedtime.  Marland Kitchen  fluticasone (FLONASE) 50 MCG/ACT nasal spray Place 1 spray into both nostrils daily.  . Fluticasone-Salmeterol (WIXELA INHUB) 500-50 MCG/DOSE AEPB Inhale 1 puff into the lungs in the morning and at bedtime.  Marland Kitchen losartan-hydrochlorothiazide (HYZAAR) 50-12.5 MG tablet Take 1 tablet by mouth daily.  Marland Kitchen omeprazole (PRILOSEC) 20 MG capsule Take 1 capsule (20 mg total) by mouth daily.  . ondansetron (ZOFRAN-ODT) 8 MG disintegrating tablet Take 8 mg by mouth every 6 (six) hours as needed.  . Tiotropium Bromide Monohydrate (SPIRIVA RESPIMAT) 1.25 MCG/ACT AERS Inhale 2 puffs into the lungs daily.   No facility-administered encounter medications on file as of 05/30/2020.     Review of Systems  Review of Systems  Constitutional: Positive for fatigue. Negative for activity change and fever.  HENT: Negative for sinus pressure, sinus pain and sore throat.   Respiratory: Positive for shortness of breath and wheezing. Negative for cough.   Cardiovascular: Negative for chest pain and palpitations.  Gastrointestinal: Negative for diarrhea, nausea and vomiting.  Musculoskeletal: Negative for arthralgias.  Neurological: Negative for dizziness.  Psychiatric/Behavioral: Negative for sleep disturbance. The patient is not nervous/anxious.      Physical Exam  BP 112/70   Pulse 96   Temp 98.5 F (36.9 C) (Oral)   Ht 5\' 2"  (1.575 m)   Wt 230 lb 6 oz (104.5 kg)   SpO2 99%   BMI 42.14 kg/m   Wt Readings from Last 5 Encounters:  05/30/20 230 lb 6 oz (104.5 kg)  05/04/20 232 lb (105.2 kg)   04/03/20 234 lb 9.6 oz (106.4 kg)  03/22/20 233 lb 11 oz (106 kg)  03/16/20 232 lb 6.4 oz (105.4 kg)    BMI Readings from Last 5 Encounters:  05/30/20 42.14 kg/m  05/04/20 42.43 kg/m  04/03/20 42.91 kg/m  03/22/20 42.74 kg/m  03/16/20 42.51 kg/m     Physical Exam Vitals and nursing note reviewed.  Constitutional:      General: She is not in acute distress.    Appearance: Normal appearance. She is obese.  HENT:     Head: Normocephalic and atraumatic.     Right Ear: Tympanic membrane, ear canal and external ear normal. There is no impacted cerumen.     Left Ear: Tympanic membrane, ear canal and external ear normal. There is no impacted cerumen.     Nose: Rhinorrhea present. No congestion.     Mouth/Throat:     Mouth: Mucous membranes are moist.     Pharynx: Oropharynx is clear.  Eyes:     Pupils: Pupils are equal, round, and reactive to light.  Neck:     Thyroid: No thyroid mass or thyromegaly.     Trachea: Trachea normal.     Comments: Upper airway wheeze Cardiovascular:     Rate and Rhythm: Normal rate and regular rhythm.     Pulses: Normal pulses.     Heart sounds: Normal heart sounds. No murmur heard.   Pulmonary:     Effort: Pulmonary effort is normal. No respiratory distress.     Breath sounds: No decreased air movement. Wheezing (Upper airway wheeze) present. No decreased breath sounds or rales.  Musculoskeletal:     Cervical back: Normal range of motion. No edema.  Skin:    General: Skin is warm and dry.     Capillary Refill: Capillary refill takes less than 2 seconds.  Neurological:     General: No focal deficit present.     Mental Status: She is alert and oriented to  person, place, and time. Mental status is at baseline.     Gait: Gait normal.  Psychiatric:        Mood and Affect: Mood normal.        Behavior: Behavior normal.        Thought Content: Thought content normal.        Judgment: Judgment normal.       Assessment & Plan:   Severe  persistent asthma without complication Plan: Continue Wixela 500 May need to consider decreasing ICS/LABA after further evaluation by [facility] team Continue Spiriva Respimat 1.25 Continue Zyrtec Continue Flonase Continue albuterol nebulized meds Continue to work on increasing overall physical mobility and reducing BMI  GERD (gastroesophageal reflux disease) Plan: Continue omeprazole Continue Pepcid  Vocal cord dysfunction Patient saw ear nose and throat provider Dr. Jenne Pane on March/2021 showing paradoxical vocal cord motion during relaxed breathing felt to be contributing to her shortness of breath ENT referred to speech pathology ENT recommending max GERD therapy Patient currently waiting to be established with [facility]  Discussion: Believe patient's vocal cord dysfunction is her largest component to the issues with her breathing at this time.  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  I have completed these forms based off of my physical exam today and her walk in office where she had exertional hypoxemia.  I provided these forms to the patient's caseworker.  A work note was provided for patient to be out of work for the next 6 to 8-week follow-up.  Copies were made and handed to [facility] for our records.  Plan: Continue for with speech therapy Contact [facility] to get an appointment to establish care with them If you are having difficulty obtaining appointment please contact your ear nose and throat provider Dr. Jenne Pane  History of COVID-19 History of COVID-19 infection in December/2020 Did not require hospitalization Severe persistent asthma symptoms as well as vocal cord dysfunction status post COVID-19 infection  Plan: Walk today in office, patient had oxygen desaturations on room air with quick recovery down to 86% We will order 6-minute walk at next office  visit 6 to 8-week follow-up with our office today  At risk for obstructive sleep apnea At risk for obstructive sleep apnea  Plan: Complete home sleep study  Healthcare maintenance Patient currently unvaccinated COVID-19 Patient does not have seasonal flu vaccine  Plan: Would recommend obtaining seasonal flu vaccine when available Would recommend obtaining the COVID-19 vaccinations    Return in about 2 months (around 07/30/2020), or if symptoms worsen or fail to improve, for Follow up with Dr. Everardo All, Follow up with Elisha Headland FNP-C.   Coral Ceo, NP 05/30/2020   This appointment required 55 minutes of patient care (this includes precharting, chart review, review of results, face-to-face care, etc.).

## 2020-05-30 NOTE — Assessment & Plan Note (Signed)
Patient saw ear nose and throat provider Dr. Jenne Pane on March/2021 showing paradoxical vocal cord motion during relaxed breathing felt to be contributing to her shortness of breath ENT referred to speech pathology ENT recommending max GERD therapy Patient currently waiting to be established with [facility]  Discussion: Believe patient's vocal cord dysfunction is her largest component to the issues with her breathing at this time.  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  I have completed these forms based off of my physical exam today and her walk in office where she had exertional hypoxemia.  I provided these forms to the patient's caseworker.  A work note was provided for patient to be out of work for the next 6 to 8-week follow-up.  Copies were made and handed to [facility] for our records.  Plan: Continue for with speech therapy Contact [facility] to get an appointment to establish care with them If you are having difficulty obtaining appointment please contact your ear nose and throat provider Dr. Jenne Pane

## 2020-05-30 NOTE — Assessment & Plan Note (Signed)
History of COVID-19 infection in December/2020 Did not require hospitalization Severe persistent asthma symptoms as well as vocal cord dysfunction status post COVID-19 infection  Plan: Walk today in office, patient had oxygen desaturations on room air with quick recovery down to 86% We will order 6-minute walk at next office visit 6 to 8-week follow-up with our office today

## 2020-05-30 NOTE — Patient Instructions (Addendum)
You were seen today by Coral Ceo, NP  for:   1. Vocal cord dysfunction  Please contact [facility] today to secure an appointment for your vocal cord dysfunction  2. Severe persistent asthma without complication  Continue Wixela 500  Spiriva Respimat 1.25 >>> 2 puffs daily >>> Do this every day >>>This is not a rescue inhaler  Continue Zyrtec   3. Gastroesophageal reflux disease, unspecified whether esophagitis present  Continue Prilosec Continue Pepcid  GERD management: >>>Avoid laying flat until 2 hours after meals >>>Elevate head of the bed including entire chest >>>Reduce size of meals and amount of fat, acid, spices, caffeine and sweets >>>If you are smoking, Please stop! >>>Decrease alcohol consumption >>>Work on maintaining a healthy weight with normal BMI   4. History of COVID-19  Paperwork completed for case management  I do not believe that you can return to work at this time  Walk today in office, oxygen saturations dropped to 84% with quick recovery  We will recommend 6-minute walk at follow-up  Establish care with [facility] team for vocal cord dysfunction  5. At risk for obstructive sleep apnea  Complete home sleep study tonight   Follow Up:    Return in about 2 months (around 07/30/2020), or if symptoms worsen or fail to improve, for Follow up with Dr. Everardo All, Follow up with Elisha Headland FNP-C. With 6 min walk   Notification of test results are managed in the following manner: If there are  any recommendations or changes to the  plan of care discussed in office today,  we will contact you and let you know what they are. If you do not hear from [place], then your results are normal and you can view them through your  MyChart account , or a letter will be sent to you. Thank you again for trusting [place] with your care  - Thank you, [facility]    It is flu season:   >>> Best ways to protect herself from the flu: Receive the yearly flu vaccine,  practice good hand hygiene washing with soap and also using hand sanitizer when available, eat a nutritious meals, get adequate rest, hydrate appropriately       Please contact the office if your symptoms worsen or you have concerns that you are not improving.   Thank you for choosing [facility] for your healthcare, and for allowing [place] to partner with you on your healthcare journey. I am thankful to be able to provide care to you today.   Elisha Headland FNP-C

## 2020-05-30 NOTE — Telephone Encounter (Signed)
Pt called to let Lauris Poag know she has not heard from [facility] to Schedule Swallow Study. Ameila will call pt back today and discuss this issue.

## 2020-05-30 NOTE — Assessment & Plan Note (Signed)
At risk for obstructive sleep apnea  Plan: Complete home sleep study

## 2020-05-30 NOTE — Assessment & Plan Note (Signed)
Patient currently unvaccinated COVID-19 Patient does not have seasonal flu vaccine  Plan: Would recommend obtaining seasonal flu vaccine when available Would recommend obtaining the COVID-19 vaccinations

## 2020-05-30 NOTE — Telephone Encounter (Signed)
SLP contacted Pt via phone and provided information regarding Recommendation for Pt to see Voice Pathologist & Singing Voice Specialist Amy K. Fields, MM, MA, CCC-SLP in the Department of Otolaryngology Head and Neck Surgery. SLP provided phone number [phone] for [facility] appointments and Pt was further informed that the evaluation referral would likely need to be sent from her MD. Pt was again educated that an SLP here at the [facility] Outpatient office could provide Voice therapy with using the POC provided following comprehensive voice evaluation completed by Voice Pathologist named above. Pt voiced understanding and is in agreement with plan. Thank you,  Jane Fornes H. Romie Levee, CCC-SLP Speech Language Pathologist

## 2020-05-31 NOTE — Telephone Encounter (Signed)
Per Brians note at last OV: Plan: Continue for with speech therapy Contact [facility] to get an appointment to establish care with them If you are having difficulty obtaining appointment please contact your ear nose and throat provider Dr. Jenne Pane

## 2020-06-05 ENCOUNTER — Encounter: Payer: Self-pay | Admitting: *Deleted

## 2020-06-05 ENCOUNTER — Other Ambulatory Visit: Payer: Self-pay | Admitting: *Deleted

## 2020-06-05 DIAGNOSIS — J302 Other seasonal allergic rhinitis: Secondary | ICD-10-CM

## 2020-06-05 MED ORDER — FLUTICASONE PROPIONATE 50 MCG/ACT NA SUSP: 1.0000 | Freq: Every day | NASAL | 2 refills | Status: DC

## 2020-06-06 DIAGNOSIS — G4733 Obstructive sleep apnea (adult) (pediatric): Secondary | ICD-10-CM

## 2020-06-12 ENCOUNTER — Other Ambulatory Visit: Payer: Self-pay | Admitting: Primary Care

## 2020-06-25 ENCOUNTER — Telehealth (HOSPITAL_COMMUNITY): Payer: Self-pay | Admitting: Speech Pathology

## 2020-06-25 ENCOUNTER — Encounter (HOSPITAL_COMMUNITY): Payer: Self-pay

## 2020-06-25 ENCOUNTER — Ambulatory Visit (HOSPITAL_COMMUNITY): Payer: PRIVATE HEALTH INSURANCE | Admitting: Speech Pathology

## 2020-06-25 NOTE — Telephone Encounter (Signed)
S/w Dabney: Dabney doesn't need to see this patient - she will recieve her patient care in a [facility].

## 2020-06-26 ENCOUNTER — Telehealth: Payer: Self-pay | Admitting: Pulmonary Disease

## 2020-06-26 NOTE — Telephone Encounter (Signed)
Dr. Everardo All is back on 11/14 it will be in the pink mail folder

## 2020-07-10 NOTE — Telephone Encounter (Signed)
Diamantina Providence with [employer] regarding correspondence from 10/1 that Dr. Everardo All needed to sign.  Please advise.  [phone]

## 2020-07-12 NOTE — Telephone Encounter (Signed)
Dr. Everardo All was in clinic the week of 10/11.  Lauren, do you know if these were completed? Or who was working with her to have these forms? Per Dr. Everardo All she has completed all her paperwork.

## 2020-07-13 ENCOUNTER — Ambulatory Visit: Payer: No Typology Code available for payment source

## 2020-07-13 ENCOUNTER — Telehealth: Payer: Self-pay | Admitting: Primary Care

## 2020-07-13 ENCOUNTER — Encounter: Payer: Self-pay | Admitting: Pulmonary Disease

## 2020-07-13 ENCOUNTER — Ambulatory Visit (INDEPENDENT_AMBULATORY_CARE_PROVIDER_SITE_OTHER): Payer: No Typology Code available for payment source | Admitting: Pulmonary Disease

## 2020-07-13 ENCOUNTER — Other Ambulatory Visit: Payer: Self-pay

## 2020-07-13 DIAGNOSIS — J383 Other diseases of vocal cords: Secondary | ICD-10-CM | POA: Diagnosis not present

## 2020-07-13 DIAGNOSIS — J4550 Severe persistent asthma, uncomplicated: Secondary | ICD-10-CM | POA: Diagnosis not present

## 2020-07-13 DIAGNOSIS — K219 Gastro-esophageal reflux disease without esophagitis: Secondary | ICD-10-CM | POA: Diagnosis not present

## 2020-07-13 DIAGNOSIS — G4733 Obstructive sleep apnea (adult) (pediatric): Secondary | ICD-10-CM

## 2020-07-13 DIAGNOSIS — Z8616 Personal history of COVID-19: Secondary | ICD-10-CM | POA: Diagnosis not present

## 2020-07-13 DIAGNOSIS — Z7189 Other specified counseling: Secondary | ICD-10-CM | POA: Insufficient documentation

## 2020-07-13 MED ORDER — FAMOTIDINE 40 MG PO TABS: 40.0000 mg | ORAL_TABLET | Freq: Every day | ORAL | Status: AC

## 2020-07-13 MED ORDER — METHYLPREDNISOLONE ACETATE 80 MG/ML IJ SUSP
120.0000 mg | Freq: Once | INTRAMUSCULAR | Status: AC
  Administered 2020-07-13: 120 mg via INTRAMUSCULAR

## 2020-07-13 MED ORDER — OMEPRAZOLE 40 MG PO CPDR: 40.0000 mg | DELAYED_RELEASE_CAPSULE | Freq: Every day | ORAL | 3 refills | Status: DC

## 2020-07-13 NOTE — Patient Instructions (Addendum)
You were seen today by Coral Ceo, NP  for:   1. Vocal cord dysfunction 2. Gastroesophageal reflux disease, unspecified whether esophagitis present  - omeprazole (PRILOSEC) 40 MG capsule; Take 1 capsule (40 mg total) by mouth daily.  Dispense: 30 capsule; Refill: 3 - famotidine (PEPCID) 40 MG tablet; Take 1 tablet (40 mg total) by mouth at bedtime. - methylPREDNISolone acetate (DEPO-MEDROL) injection 120 mg  Depo injection 120  Prednisone 10mg  tablet  >>>Take 2 tablets (20 mg total) daily for the next 5 days >>> Take with food in the morning >>> start 07/14/20  We have increased your omeprazole  We have increased your Pepcid  I have contacted ear nose and throat Dr. 07/16/20 team to see the status of your referral to [facility] for speech therapy and voice rehab  3. Severe persistent asthma without complication  - methylPREDNISolone acetate (DEPO-MEDROL) injection 120 mg  Continue Advair 500  4. History of COVID-19  Keep your upcoming appointment with [facility]. [facility] on 07/27/2020 as your insurance is requesting this appointment to further review her Worker's Comp. claims   5. OSA (obstructive sleep apnea)  You do have mild obstructive sleep apnea  We have better control of your vocal cord dysfunction we can consider treatment with CPAP therapy   We recommend today:   Meds ordered this encounter  Medications  . omeprazole (PRILOSEC) 40 MG capsule    Sig: Take 1 capsule (40 mg total) by mouth daily.    Dispense:  30 capsule    Refill:  3  . famotidine (PEPCID) 40 MG tablet    Sig: Take 1 tablet (40 mg total) by mouth at bedtime.  . methylPREDNISolone acetate (DEPO-MEDROL) injection 120 mg    Follow Up:    Return in about 2 weeks (around 07/27/2020), or if symptoms worsen or fail to improve, for Follow up with Dr. 13/01/2020.   Notification of test results are managed in the following manner: If there are  any recommendations or changes to the  plan of care discussed in  office today,  we will contact you and let you know what they are. If you do not hear from Isaiah Serge, then your results are normal and you can view them through your  MyChart account , or a letter will be sent to you. Thank you again for trusting [place] with your care  - Thank you, [facility]    It is flu season:   >>> Best ways to protect herself from the flu: Receive the yearly flu vaccine, practice good hand hygiene washing with soap and also using hand sanitizer when available, eat a nutritious meals, get adequate rest, hydrate appropriately       Please contact the office if your symptoms worsen or you have concerns that you are not improving.   Thank you for choosing [facility] for your healthcare, and for allowing [place] to partner with you on your healthcare journey. I am thankful to be able to provide care to you today.   [place] FNP-C

## 2020-07-13 NOTE — Assessment & Plan Note (Signed)
Patient saw ear nose and throat provider Dr. Jenne Pane on March/2021 showing paradoxical vocal cord motion during relaxed breathing felt to be contributing to her shortness of breath ENT referred to speech pathology ENT recommending max GERD therapy Patient currently waiting to be established with [facility]  Discussion: This is the most pertinent issue presenting for the patient right now.  Symptoms have flared ever since COVID-19 infection.  Patient struggling with establishing care with [facility] speech pathology.  I personally contacted Dr. Dionicio Stall office.  Spoke with on-call provider Dr. Pollyann Kennedy.  Spoke with Dr. Pollyann Kennedy directly about the patient struggling with establishing care.  He reported that he will have his team look at this immediately to see how quickly we can get the patient established appropriately.  He agrees with increasing patient's acid reflux medications today.  He was also notified that patient was provided a Depo-Medrol injection today as well as a short course of steroids given her persistent upper airway wheeze.  Per patient's case manager and visit today there seems to be a debate on whether or not patient's Workmen's Comp. for insurance should be covering the voice therapy.  They would like to have a another appointment with Dr. Isaiah Serge to discuss this.  Plan: Continue for with speech therapy Contact [facility] to get an appointment to establish care with them If you are having difficulty obtaining appointment please contact your ear nose and throat provider Dr. Lawanda Cousins 120 today Prednisone 20 mg for 5 days starting tomorrow Omeprazole 40 mg daily Pepcid 40 mg at night

## 2020-07-13 NOTE — Telephone Encounter (Signed)
Pt was left vm advised zyrtec is available otc

## 2020-07-13 NOTE — Assessment & Plan Note (Signed)
This continues to be a complex case.  There is debate between the patient's insurance as well as Workmen's Comp. on coverage of therapies of voice rehab as well as inhalers and follow-up with pulmonary team.  There is debate between whether or not these are symptoms of long Covid or flares post Covid syndrome.  Or if these are simply chronic issues that the patient had prior to COVID-19.  Insurance/Workmen's Comp. team is requesting another follow-up with Dr. Isaiah Serge to discuss this.  I have obtained a copy of the paperwork they would like Dr. Isaiah Serge to fill out.  I will discuss this with Dr. Isaiah Serge.  Patient has an upcoming appointment on 07/27/2020 to discuss this.  I have also personally contacted the ear nose and throat provider Dr. Dionicio Stall team.  Spoke with on-call physician Dr. Pollyann Kennedy.  He will have his team look and see why the patient is struggling with getting established in [facility].

## 2020-07-13 NOTE — Assessment & Plan Note (Signed)
Plan: Can consider CPAP therapy when symptoms of vocal cord dysfunction are better managed. 

## 2020-07-13 NOTE — Progress Notes (Signed)
  ID: Jane Bennett, female    DOB: 27-May-1966, 54 y.o.   MRN: [id number]  Chief Complaint  Patient presents with  . Follow-up    Vocal cord Dysfunction    Referring provider: [facility]  HPI:  54 year old female never smoker found office for asthma, history of COVID-19, vocal cord dysfunction  PMH: Clear history of COVID-19, obesity, GERD Smoker/ Smoking History: Never Maintenance:  Wixella 500 Pt of: Dr. Everardo All   07/13/2020  - Visit   54 year old female never smoker upon our office for asthma, history of COVID-19 infection and vocal cord dysfunction.  She is established with Dr. Everardo All.  She was last seen in our office in September/2021.    She had a walk in office on 05/30/2021 with a brief oxygen desaturation.  She was scheduled to have a 6-minute walk at follow-up today.  She was encouraged to contact [facility] and establish with them for speech therapy due to her persisting vocal cord dysfunction.  It was recommended that she obtain the COVID-19 vaccinations as well as the seasonal flu vaccine.  It was recommended that she have follow-up with Dr. Everardo All or myself in 2 months.  Patient presents today for the follow-up and a 6-minute walk.  Patient completed 6-minute walk today prior to this office visit.  She tolerated this well.  Patient did have episodes of dyspnea as well as very audible wheezing.  She did not have any oxygen desaturations.  Unfortunately patient still has not been set up with voice therapy/rehab [facility] health.  She reports that she is contacted Dr. Dionicio Stall office 2 times to assess the status of the referral.  She last spoke with them last week.  She has not heard anything back.  Patient admits that some days she does okay with her breathing but she is very limited with mobility by her symptoms.  She reports that she typically only walks around 50 to 75 feet due to ongoing cough, wheezing, shortness of  breath.  Patient was evaluated by Dr. 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 Everardo All.  The assessment and plan from that visit is listed below:   July/2021 - Mannam  Assessment:  Assessment for post Covid ILD She had a recent HRCT.  I do not have the images to review as it was done at [facility].  At least per report there is no evidence of interstitial lung disease No evidence of restriction on PFTs  Asthma, VCD Suspect her main issue is asthma with VCD which can frequently occur together.  PFTs reviewed with significant bronchodilator response.  She will need to be seen by speech pathology however it is denied by work comp as this is likely not related to COVID-19 We will try to get this done through her regular insurance. Continue Advair. Wean off prednisone as I do not think it is helping  She will also need optimal management of coexisting GERD, obesity and likely undiagnosed sleep apnea which are also playing a significant role in presentation  Plan/Recommendations: Refer back to speech pathology Taper prednisone to 2.5 mg for 1 week and then stop Continue Advair Increase omeprazole to 40 mg twice daily for 2 weeks.  Then continue at 20 mg during the day and Pepcid at night Home sleep study Weight loss  Patient has completed a home sleep study which shows mild obstructive sleep apnea.  Patient continues to struggle with insurance coverage and Workmen's Comp.  She is presenting today with her case Production designer, theatre/television/filmmanager.  It was not clear to the patient that insurance wanted her to see Dr. Isaiah SergeMannam again.  There continues to be domains on whether or not patient's vocal cord dysfunction and asthma are related to COVID-19 exposure.  Although doesn't appear the patient has COVID-19 ILD her symptoms have acutely worsened since her COVID-19 infection.  Questionaires / Pulmonary Flowsheets:   ACT:  Asthma Control Test ACT  Total Score  07/13/2020 8  05/04/2020 7    MMRC: mMRC Dyspnea Scale mMRC Score  05/30/2020 3  04/03/2020 1    Epworth:  No flowsheet data found.  Tests:   Imaging: HRCT 03/30/2020 There is no evidence of interstitial lung disease.  Mild diffuse central airway thickening is noted which can be seen with asthma or bronchitis.   PFTs: 01/06/2020 FVC 1.27 [39%], FEV1 1.06 [41%], F/F 84 TLC 3.97 [83%], DLCO 20.20 [104%]  Labs: IgE 01/06/2020- 18 CBC 10/11/2019-WBC 8.7, eos 2%, absolute eosinophil count 174  09/15/2019-SARS-CoV-2-positive  05/30/2020-home sleep study-AHI 8.5, SaO2 low 75%  FENO:  No results found for: NITRICOXIDE  PFT: PFT Results Latest Ref Rng & Units 01/06/2020  FVC-Pre L 1.20  FVC-Predicted Pre % 37  FVC-Post L 1.27  FVC-Predicted Post % 39  Pre FEV1/FVC % % 70  Post FEV1/FCV % % 84  FEV1-Pre L 0.84  FEV1-Predicted Pre % 33  FEV1-Post L 1.06  DLCO uncorrected ml/min/mmHg 20.20  DLCO UNC% % 104  DLCO corrected ml/min/mmHg 20.20  DLCO COR %Predicted % 104  DLVA Predicted % 133  TLC L 3.97  TLC % Predicted % 83  RV % Predicted % 91    WALK:  SIX MIN WALK 07/13/2020 05/30/2020 03/22/2020 11/02/2019  Medications NONE - - -  Supplimental Oxygen during Test? (L/min) No No - No  Laps 10 - - -  Partial Lap (in Meters) 1 - - -  Baseline BP (sitting) 120/88 - - -  Baseline Heartrate 74 - - -  Baseline Dyspnea (Borg Scale) 0.5 - - -  Baseline Fatigue (Borg Scale) 2 - - -  Baseline SPO2 97 - - -  2 Minute Oxygen Saturation % - - 95 -  2 Minute HR - - 113 -  4 Minute Oxygen Saturation % - - 95 -  4 Minute HR - - 112 -  6 Minute Oxygen Saturation % - - 96 -  6 Minute HR - - 110 -  BP (sitting) 160/106 - - -  Heartrate 104 - - -  Dyspnea (Borg Scale) 7 - - -  Fatigue (Borg Scale) 5 - - -  SPO2 96 - - -  BP (sitting) 156/100 - - -  Heartrate 93 - - -  SPO2 96 - - -  Stopped or Paused before Six Minutes Yes - - -  Other Symptoms at end of Exercise To  catch her breath patient has Vocal cord dysfunction - - -  Interpretation (No Data) - - -  Distance Completed 341 - - -  Distance Completed 1 - - -  Tech Comments: Patient walked slow pace and maintained good sats. Had to stop once to catch her breath due to Vocal cord dysfunction, completed 6 min walk pt could not complete 1 full lap.  she did about 1/4 of a lap and dropped to 84% on room air.  she did a quick recovery once seated. - Patient  walked at an average pace only able to complete one lap. Patient stopped three times while doing the lap to cough and to catch her breath. Patient had complaints of severe SOB and chest tightness and also wheezed the entire time while walking.    Imaging: No results found.  Lab Results:  CBC    Component Value Date/Time   WBC 8.7 10/11/2019 2010   RBC 4.80 10/11/2019 2010   HGB 13.5 10/11/2019 2010   HCT 42.8 10/11/2019 2010   PLT 223 10/11/2019 2010   MCV 89.2 10/11/2019 2010   MCH 28.1 10/11/2019 2010   MCHC 31.5 10/11/2019 2010   RDW 15.1 10/11/2019 2010   LYMPHSABS 3.5 10/11/2019 2010   MONOABS 0.5 10/11/2019 2010   EOSABS 0.2 10/11/2019 2010   BASOSABS 0.1 10/11/2019 2010    BMET    Component Value Date/Time   NA 139 01/06/2020 1122   K 4.0 01/06/2020 1122   CL 101 01/06/2020 1122   CO2 30 01/06/2020 1122   GLUCOSE 96 01/06/2020 1122   BUN 13 01/06/2020 1122   CREATININE 0.85 01/06/2020 1122   CALCIUM 9.6 01/06/2020 1122   GFRNONAA >60 10/11/2019 2010   GFRAA >60 10/11/2019 2010    BNP No results found for: BNP  ProBNP No results found for: PROBNP  Specialty Problems      Pulmonary Problems   Vocal cord dysfunction    Patient evaluated in March/2021 at [facility] with Dr. Jenne Pane.  Findings were positive for paradoxical vocal cord motion during relaxed breathing felt to be contributing to her shortness of breath Referred to speech pathology, now referred to [facility]      OSA (obstructive  sleep apnea)   Severe persistent asthma without complication      Allergies  Allergen Reactions  . Other Hives  . Sulfa Antibiotics Hives  . Bee Venom Hives    Immunization History  Administered Date(s) Administered  . Influenza Whole 07/04/2019   Continue to recommend seasonal flu vaccine in fall/2021  Recommend patient obtain COVID-19 vaccinations  Past Medical History:  Diagnosis Date  . Asthma   . DVT (deep venous thrombosis) (HCC)   . High cholesterol   . Pneumonia     Tobacco History: Social History   Tobacco Use  Smoking Status Never Smoker  Smokeless Tobacco Never Used   Counseling given: Not Answered   Continue to not smoke  Outpatient Encounter Medications as of 07/13/2020  Medication Sig  . albuterol (PROVENTIL) (2.5 MG/3ML) 0.083% nebulizer solution Take 3 mLs (2.5 mg total) by nebulization every 6 (six) hours as needed for wheezing or shortness of breath.  Marland Kitchen albuterol (VENTOLIN HFA) 108 (90 Base) MCG/ACT inhaler INHALE 1 TO 2 PUFFS BY MOUTH EVERY 4 TO 6 HOURS  . cetirizine (ZYRTEC) 10 MG tablet Take 1 tablet by mouth once daily  . fluticasone (FLONASE) 50 MCG/ACT nasal spray Place 1 spray into both nostrils daily.  . Fluticasone-Salmeterol (WIXELA INHUB) 500-50 MCG/DOSE AEPB Inhale 1 puff into the lungs in the morning and at bedtime.  Marland Kitchen losartan-hydrochlorothiazide (HYZAAR) 50-12.5 MG tablet Take 1 tablet by mouth daily.  . ondansetron (ZOFRAN-ODT) 8 MG disintegrating tablet Take 8 mg by mouth every 6 (six) hours as needed.  . Tiotropium Bromide Monohydrate (SPIRIVA RESPIMAT) 1.25 MCG/ACT AERS Inhale 2 puffs into the lungs daily.  . [DISCONTINUED] famotidine (PEPCID) 20 MG tablet Take 1 tablet (20 mg total) by mouth at bedtime.  . [DISCONTINUED] omeprazole (PRILOSEC) 20  MG capsule Take 1 capsule by mouth once daily  . famotidine (PEPCID) 40 MG tablet Take 1 tablet (40 mg total) by mouth at bedtime.  Marland Kitchen omeprazole (PRILOSEC) 40 MG capsule Take 1 capsule  (40 mg total) by mouth daily.  . [EXPIRED] methylPREDNISolone acetate (DEPO-MEDROL) injection 120 mg    No facility-administered encounter medications on file as of 07/13/2020.     Review of Systems  Review of Systems  Constitutional: Positive for fatigue. Negative for activity change and fever.  HENT: Negative for congestion, sinus pressure, sinus pain and sore throat.   Respiratory: Positive for cough, shortness of breath and wheezing.   Cardiovascular: Negative for chest pain and palpitations.  Gastrointestinal: Negative for diarrhea, nausea and vomiting.  Musculoskeletal: Negative for arthralgias.  Neurological: Negative for dizziness.  Psychiatric/Behavioral: Negative for sleep disturbance. The patient is not nervous/anxious.      Physical Exam  There were no vitals taken for this visit.  Wt Readings from Last 5 Encounters:  07/13/20 230 lb (104.3 kg)  05/30/20 230 lb 6 oz (104.5 kg)  05/04/20 232 lb (105.2 kg)  04/03/20 234 lb 9.6 oz (106.4 kg)  03/22/20 233 lb 11 oz (106 kg)    BMI Readings from Last 5 Encounters:  07/13/20 42.07 kg/m  05/30/20 42.14 kg/m  05/04/20 42.43 kg/m  04/03/20 42.91 kg/m  03/22/20 42.74 kg/m     Physical Exam Vitals and nursing note reviewed.  Constitutional:      General: She is not in acute distress.    Appearance: Normal appearance.     Comments: Visibly distressed adult female  HENT:     Head: Normocephalic and atraumatic.     Right Ear: Tympanic membrane, ear canal and external ear normal. There is no impacted cerumen.     Left Ear: Tympanic membrane, ear canal and external ear normal. There is no impacted cerumen.     Nose: Nose normal. No congestion.     Mouth/Throat:     Mouth: Mucous membranes are moist.     Pharynx: Oropharynx is clear.  Eyes:     Pupils: Pupils are equal, round, and reactive to light.  Neck:     Thyroid: No thyromegaly or thyroid tenderness.     Comments: Profound upper airway  wheeze Cardiovascular:     Rate and Rhythm: Normal rate and regular rhythm.     Pulses: Normal pulses.     Heart sounds: Normal heart sounds. No murmur heard.   Pulmonary:     Effort: Pulmonary effort is normal. No respiratory distress.     Breath sounds: No decreased air movement. Wheezing present. No decreased breath sounds or rales.  Musculoskeletal:     Cervical back: Normal range of motion. No edema.  Skin:    General: Skin is warm and dry.     Capillary Refill: Capillary refill takes less than 2 seconds.  Neurological:     General: No focal deficit present.     Mental Status: She is alert and oriented to person, place, and time. Mental status is at baseline.     Gait: Gait normal.  Psychiatric:        Mood and Affect: Mood normal.        Behavior: Behavior normal.        Thought Content: Thought content normal.        Judgment: Judgment normal.       Assessment & Plan:   Vocal cord dysfunction Patient saw ear nose and throat provider  Dr. Jenne Pane on March/2021 showing paradoxical vocal cord motion during relaxed breathing felt to be contributing to her shortness of breath ENT referred to speech pathology ENT recommending max GERD therapy Patient currently waiting to be established with [facility]  Discussion: This is the most pertinent issue presenting for the patient right now.  Symptoms have flared ever since COVID-19 infection.  Patient struggling with establishing care with [facility] speech pathology.  I personally contacted Dr. Dionicio Stall office.  Spoke with on-call provider Dr. Pollyann Kennedy.  Spoke with Dr. Pollyann Kennedy directly about the patient struggling with establishing care.  He reported that he will have his team look at this immediately to see how quickly we can get the patient established appropriately.  He agrees with increasing patient's acid reflux medications today.  He was also notified that patient was provided a Depo-Medrol injection  today as well as a short course of steroids given her persistent upper airway wheeze.  Per patient's case manager and visit today there seems to be a debate on whether or not patient's Workmen's Comp. for insurance should be covering the voice therapy.  They would like to have a another appointment with Dr. Isaiah Serge to discuss this.  Plan: Continue for with speech therapy Contact [facility] to get an appointment to establish care with them If you are having difficulty obtaining appointment please contact your ear nose and throat provider Dr. Lawanda Cousins 120 today Prednisone 20 mg for 5 days starting tomorrow Omeprazole 40 mg daily Pepcid 40 mg at night  OSA (obstructive sleep apnea) Plan: Can consider CPAP therapy when symptoms of vocal cord dysfunction are better managed.  Severe persistent asthma without complication Plan: Continue Wixela 500 Continue Spiriva Respimat 1.25  Continue Zyrtec Continue Flonase Continue albuterol nebulized meds Continue to work on increasing overall physical mobility and reducing BMI  GERD (gastroesophageal reflux disease) Plan: Increase omeprazole today Increase Pepcid today  Coordination of complex care This continues to be a complex case.  There is debate between the patient's insurance as well as Workmen's Comp. on coverage of therapies of voice rehab as well as inhalers and follow-up with pulmonary team.  There is debate between whether or not these are symptoms of long Covid or flares post Covid syndrome.  Or if these are simply chronic issues that the patient had prior to COVID-19.  Insurance/Workmen's Comp. team is requesting another follow-up with Dr. Isaiah Serge to discuss this.  I have obtained a copy of the paperwork they would like Dr. Isaiah Serge to fill out.  I will discuss this with Dr. Isaiah Serge.  Patient has an upcoming appointment on 07/27/2020 to discuss this.  I have also personally contacted the ear nose and throat  provider Dr. Dionicio Stall team.  Spoke with on-call physician Dr. Pollyann Kennedy.  He will have his team look and see why the patient is struggling with getting established in [facility].    Return in about 2 weeks (around 07/27/2020), or if symptoms worsen or fail to improve, for Follow up with Dr. Isaiah Serge.   Coral Ceo, NP 07/13/2020   This appointment required 72 minutes of patient care (this includes precharting, chart review, review of results, face-to-face care, etc.).

## 2020-07-13 NOTE — Progress Notes (Signed)
Six Minute Walk - 07/13/20 1004      Six Minute Walk   Medications taken before test (dose and time) NONE    Supplemental oxygen during test? No    Lap distance in meters  34 meters    Laps Completed 10    Partial lap (in meters) 1 meters    Baseline BP (sitting) 120/88    Baseline Heartrate 74    Baseline Dyspnea (Borg Scale) 0.5    Baseline Fatigue (Borg Scale) 2    Baseline SPO2 97 %      End of Test Values    BP (sitting) 160/106    Heartrate 104    Dyspnea (Borg Scale) 7    Fatigue (Borg Scale) 5    SPO2 96 %      2 Minutes Post Walk Values   BP (sitting) 156/100    Heartrate 93    SPO2 96 %    Stopped or paused before six minutes? Yes    Reason: To catch her breath patient has Vocal cord dysfunction    Other Symptoms at end of exercise: --   No complaints     Interpretation   Distance completed 341 meters    Tech Comments: Patient walked slow pace and maintained good sats. Had to stop once to catch her breath due to Vocal cord dysfunction, completed 6 min walk

## 2020-07-13 NOTE — Assessment & Plan Note (Signed)
Plan: Continue Wixela 500 Continue Spiriva Respimat 1.25  Continue Zyrtec Continue Flonase Continue albuterol nebulized meds Continue to work on increasing overall physical mobility and reducing BMI

## 2020-07-13 NOTE — Assessment & Plan Note (Signed)
Plan: Increase omeprazole today Increase Pepcid today

## 2020-07-14 MED ORDER — PREDNISONE 10 MG PO TABS: 20.0000 mg | ORAL_TABLET | Freq: Every day | ORAL | 0 refills | Status: AC

## 2020-07-19 NOTE — Telephone Encounter (Signed)
Jane Bennett called and is checking on [facility] form - she can be reached at [phone] -pr

## 2020-07-24 NOTE — Telephone Encounter (Signed)
I signed everything in the folders given me that week before I left the office. I do not recall seeing paperwork for worker's comp for Jane Bennett. I am available to sign forms this week. Please let me know when they are ready.

## 2020-07-24 NOTE — Telephone Encounter (Signed)
Dr. Everardo All, please advise if you have completed this. Its a workers comp case. Per Lauren its in a pink folder. Thank you.

## 2020-07-26 NOTE — Telephone Encounter (Signed)
Lauren, please advise if forms are still in pink folder. Dr. Everardo All is available this week to sign. Thank you.

## 2020-07-26 NOTE — Telephone Encounter (Signed)
Nothing in her folders as of today 07/26/20 in the pods. I will route this to [facility] to see if she has anything related to patient and workman's comp

## 2020-07-27 ENCOUNTER — Ambulatory Visit (INDEPENDENT_AMBULATORY_CARE_PROVIDER_SITE_OTHER): Payer: No Typology Code available for payment source | Admitting: Pulmonary Disease

## 2020-07-27 ENCOUNTER — Telehealth: Payer: Self-pay | Admitting: Pulmonary Disease

## 2020-07-27 ENCOUNTER — Encounter: Payer: Self-pay | Admitting: *Deleted

## 2020-07-27 ENCOUNTER — Encounter: Payer: Self-pay | Admitting: Pulmonary Disease

## 2020-07-27 ENCOUNTER — Other Ambulatory Visit: Payer: Self-pay

## 2020-07-27 VITALS — BP 134/78 | HR 68 | Temp 97.6°F | Ht 62.0 in | Wt 228.2 lb

## 2020-07-27 DIAGNOSIS — Z8616 Personal history of COVID-19: Secondary | ICD-10-CM

## 2020-07-27 DIAGNOSIS — K219 Gastro-esophageal reflux disease without esophagitis: Secondary | ICD-10-CM | POA: Diagnosis not present

## 2020-07-27 DIAGNOSIS — J383 Other diseases of vocal cords: Secondary | ICD-10-CM | POA: Diagnosis not present

## 2020-07-27 NOTE — Telephone Encounter (Signed)
I do not handle WC paperwork. However, due to the fact this has been going on for approximately a month. I have called and left message on Michelle's vm asking her to refax to my attention at [phone] - TL fax number. Once form is received I will document and place in Dr. Everardo All box for review. - pr

## 2020-07-27 NOTE — Telephone Encounter (Signed)
error 

## 2020-07-27 NOTE — Progress Notes (Signed)
For all further: Clear              Jane Bennett    [id number]    09/13/1966  Primary Care Physician:[facility]  Referring Physician: [facility] [address] 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  She did not require hospitalization Has developed symptoms of persistent wheezing, dyspnea since then Work-up including PFTs showing obstruction with bronchodilator response and paradoxical vocal cord motion on ENT examination She has been treated with Advair, PPI, prednisone with minimal improvement in symptoms  Referred to speech therapy for evaluation and treatment of vocal cord issues but has been denied by [employer] as it was not felt to be related to COVID-19.  She recently had a high-resolution CT with no evidence of interstitial lung disease  Chief complaint today is ongoing dyspnea, wheezing, marked upper airway stridor.  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  No mold, hot tub, Jacuzzi.  No down pillows or comforters Smoking history: Never smoker Travel history: No significant travel history Relevant family history: No significant family issue of lung disease  Interim history: She has been referred to speech pathology at [facility] who evaluated her and recommended referral to tertiary center with any feels, speech specialist at [facility].  She has not heard back from them in spite of multiple attempts  Continues to have significant stridor and hypoxia which is limiting her ability to do work   Outpatient Encounter Medications as of 07/27/2020  Medication Sig  . albuterol (PROVENTIL) (2.5 MG/3ML) 0.083% nebulizer solution Take 3 mLs (2.5 mg total) by nebulization every 6  (six) hours as needed for wheezing or shortness of breath.  Marland Kitchen albuterol (VENTOLIN HFA) 108 (90 Base) MCG/ACT inhaler INHALE 1 TO 2 PUFFS BY MOUTH EVERY 4 TO 6 HOURS  . cetirizine (ZYRTEC) 10 MG tablet Take 1 tablet by mouth once daily  . famotidine (PEPCID) 40 MG tablet Take 1 tablet (40 mg total) by mouth at bedtime.  . fluticasone (FLONASE) 50 MCG/ACT nasal spray Place 1 spray into both nostrils daily.  . Fluticasone-Salmeterol (WIXELA INHUB) 500-50 MCG/DOSE AEPB Inhale 1 puff into the lungs in the morning and at bedtime.  Marland Kitchen losartan-hydrochlorothiazide (HYZAAR) 50-12.5 MG tablet Take 1 tablet by mouth daily.  Marland Kitchen omeprazole (PRILOSEC) 40 MG capsule Take 1 capsule (40 mg total) by mouth daily.  . ondansetron (ZOFRAN-ODT) 8 MG disintegrating tablet Take 8 mg by mouth every 6 (six) hours as needed.  . Tiotropium Bromide Monohydrate (SPIRIVA RESPIMAT) 1.25 MCG/ACT AERS Inhale 2 puffs into the lungs daily.   No facility-administered encounter medications on file as of 07/27/2020.   Physical Exam: Blood pressure 134/78, pulse 68, temperature 97.6 F (36.4 C), temperature source Skin, height 5\' 2"  (1.575 m), weight 228 lb 3.2 oz (103.5 kg), SpO2 95 %. Gen:      No acute distress HEENT:  EOMI, sclera anicteric Neck:     No masses; no thyromegaly Lungs:    Inspiratory stridor, referred wheeze to the lungs CV:         Regular rate and rhythm; no murmurs Abd:      + bowel sounds; soft, non-tender; no palpable masses, no distension Ext:    No edema; adequate peripheral perfusion  Skin:      Warm and dry; no rash Neuro: alert and oriented x 3 Psych: normal mood and affect  Data Reviewed: Imaging: HRCT 03/30/2020 There is no evidence of interstitial lung disease.  Mild diffuse central airway thickening is noted which can be seen with asthma or bronchitis.   PFTs: 01/06/2020 FVC 1.27 [39%], FEV1 1.06 [41%], F/F 84 TLC 3.97 [83%], DLCO 20.20 [104%]  Labs: IgE 01/06/2020- 18 CBC 10/11/2019-WBC 8.7,  eos 2%, absolute eosinophil count 174  Assessment:  Asthma, VCD, post Covid She has asthma with new development of vocal cord dysfunction which came on after Covid infection.  Even though she does not have ILD I believe her presentation is Covid related as it developed right after infection  She will need follow-up with speech pathology at [facility] and will make a referral Get in touch with ENT to make sure that this is follow-through as well  I have completed paperwork for her Workmen's Compensation form to indicate that her presentation is Covid related  Plan/Recommendations: Referral to speech pathology at [facility]  Chilton Greathouse MD  Pulmonary and Critical Care 07/27/2020, 10:08 AM  CC: [facility]*

## 2020-07-27 NOTE — Telephone Encounter (Signed)
Marcelino Duster calling back to state they have not received any forms back yet. Marcelino Duster can be reached at [phone]. Response dated for 06/22/2020 can be faxed to [phone]

## 2020-07-27 NOTE — Telephone Encounter (Signed)
lmtcb for [facility] to see if they were already fax back and she received them.

## 2020-07-27 NOTE — Patient Instructions (Addendum)
We will make a referral to speech pathology.  Please also continue contact with ENT and [facility] to make sure the referral is received and follow through We will fill in paperwork for your disability and lower front indicate that in my opinion your condition is likely related to Covid  Follow-up in 3 to 4 months with Elisha Headland or Dr. Everardo All

## 2020-07-31 NOTE — Telephone Encounter (Signed)
In picking up scan documents from the pod- I found Ms. Aron's form completed and signed by Dr. Isaiah Serge. Faxed to [facility] at [phone]. - pr

## 2020-08-31 ENCOUNTER — Encounter: Payer: Self-pay | Admitting: Pulmonary Disease

## 2020-08-31 ENCOUNTER — Other Ambulatory Visit: Payer: Self-pay

## 2020-08-31 ENCOUNTER — Ambulatory Visit (INDEPENDENT_AMBULATORY_CARE_PROVIDER_SITE_OTHER): Payer: No Typology Code available for payment source | Admitting: Pulmonary Disease

## 2020-08-31 VITALS — BP 128/82 | HR 76 | Temp 97.6°F | Ht 62.0 in | Wt 227.4 lb

## 2020-08-31 DIAGNOSIS — J383 Other diseases of vocal cords: Secondary | ICD-10-CM

## 2020-08-31 DIAGNOSIS — Z8616 Personal history of COVID-19: Secondary | ICD-10-CM | POA: Diagnosis not present

## 2020-08-31 NOTE — Progress Notes (Signed)
For Bennett further: Clear              Jane Bennett    [id number]    Apr 22, 1966  Primary Care Physician:[facility]  Referring Physician: [facility] [address] 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  She did not require hospitalization Has developed symptoms of persistent wheezing, dyspnea since then Work-up including PFTs showing obstruction with bronchodilator response and paradoxical vocal cord motion on ENT examination She has been treated with Advair, PPI, prednisone with minimal improvement in symptoms  Referred to speech therapy for evaluation and treatment of vocal cord issues but has been denied by [employer] as it was not felt to be related to COVID-19.  She recently had a high-resolution CT with no evidence of interstitial lung disease  Chief complaint today is ongoing dyspnea, wheezing, marked upper airway stridor.  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  No mold, hot tub, Jacuzzi.  No down pillows or comforters Smoking history: Never smoker Travel history: No significant travel history Relevant family history: No significant family issue of lung disease  Interim history: She has been referred to speech pathology at [facility] who evaluated her and recommended referral to tertiary center with any feels, speech specialist at [facility] [place] [facility].  She has finally heard back from them and is scheduled for first appointment later this month.  Continues to have significant stridor and hypoxia which is limiting her ability to do work  Outpatient Encounter Medications as of 08/31/2020  Medication Sig  . albuterol (VENTOLIN HFA) 108 (90 Base) MCG/ACT inhaler INHALE 1 TO 2 PUFFS BY  MOUTH EVERY 4 TO 6 HOURS  . cetirizine (ZYRTEC) 10 MG tablet Take 1 tablet by mouth once daily  . famotidine (PEPCID) 40 MG tablet Take 1 tablet (40 mg total) by mouth at bedtime.  . fluticasone (FLONASE) 50 MCG/ACT nasal spray Place 1 spray into both nostrils daily.  . Fluticasone-Salmeterol (WIXELA INHUB) 500-50 MCG/DOSE AEPB Inhale 1 puff into the lungs in the morning and at bedtime.  Marland Kitchen losartan-hydrochlorothiazide (HYZAAR) 50-12.5 MG tablet Take 1 tablet by mouth daily.  Marland Kitchen omeprazole (PRILOSEC) 40 MG capsule Take 1 capsule (40 mg total) by mouth daily.  . ondansetron (ZOFRAN-ODT) 8 MG disintegrating tablet Take 8 mg by mouth every 6 (six) hours as needed.  . Tiotropium Bromide Monohydrate (SPIRIVA RESPIMAT) 1.25 MCG/ACT AERS Inhale 2 puffs into the lungs daily.  Marland Kitchen albuterol (PROVENTIL) (2.5 MG/3ML) 0.083% nebulizer solution Take 3 mLs (2.5 mg total) by nebulization every 6 (six) hours as needed for wheezing or shortness of breath. (Patient not taking: Reported on 08/31/2020)   No facility-administered encounter medications on file as of 08/31/2020.   Physical Exam: Blood pressure 128/82, pulse 76, temperature 97.6 F (36.4 C), height 5\' 2"  (1.575 m), weight 227 lb 6.4 oz (103.1 kg), SpO2 98 %. Gen:      No acute distress HEENT:  EOMI, sclera anicteric Neck:     No masses; no thyromegaly Lungs:    Inspiratory wheeze, stridor in the throat, referred to lungs CV:         Regular rate and rhythm; no murmurs Abd:      + bowel sounds; soft, non-tender; no palpable masses, no distension Ext:  No edema; adequate peripheral perfusion Skin:      Warm and dry; no rash Neuro: alert and oriented x 3 Psych: normal mood and affect  Data Reviewed: Imaging: HRCT 03/30/2020 There is no evidence of interstitial lung disease.  Mild diffuse central airway thickening is noted which can be seen with asthma or bronchitis.   PFTs: 01/06/2020 FVC 1.27 [39%], FEV1 1.06 [41%], F/F 84 TLC 3.97 [83%], DLCO  20.20 [104%]  Labs: IgE 01/06/2020- 18 CBC 10/11/2019-WBC 8.7, eos 2%, absolute eosinophil count 174  Assessment:  Asthma, VCD, post Covid She has asthma with new development of vocal cord dysfunction which came on after Covid infection.  Even though she does not have ILD I believe her presentation is Covid related as it developed right after infection  We paperwork for her Workmen's Compensation form to indicate that her presentation is Covid related and she is now covered After multiple referrals she is finally got an appointment at [facility] speech pathology. We will give her a letter to stay out of work for the next 3 to 4 months until her therapy is completed. Discussed with the case manager and patient in office today.  Follow-up in clinic in 3 to 4 months with Jane Bennett or Jane Bennett.  Plan/Recommendations: Speech pathology at [facility] Work letter given.  Chilton Greathouse MD [facility] 08/31/2020, 9:04 AM  CC: [facility]

## 2020-08-31 NOTE — Patient Instructions (Signed)
I am glad that we got you on the schedule to be seen by speech therapist at [facility] It is critical for you to start this therapy to help with the vocal cord issues.  Return to clinic in 3 to 4 months with either Jane Bennett or Dr. Everardo All We will give you a letter stating that it is okay to stay out of work until then.

## 2020-09-11 ENCOUNTER — Other Ambulatory Visit: Payer: Self-pay | Admitting: Pulmonary Disease

## 2020-10-12 ENCOUNTER — Telehealth: Payer: Self-pay | Admitting: Pulmonary Disease

## 2020-10-12 NOTE — Telephone Encounter (Signed)
Pharmacy name: [facility]  Drug requested: Spiriva 1.25 CMM?: yes Key: [id number] Covered alternatives: none listed Tried and failed: on file Decision: sent to plan, determination expected within 72 hours. Routing to [employer] for follow-up.

## 2020-10-18 NOTE — Telephone Encounter (Signed)
Medication name and strength: Spirivia PA approved/denied: cancelled Approval dates:  If denied, reason for denial: duplicate or not covered by insurance.

## 2020-10-25 ENCOUNTER — Encounter: Payer: Self-pay | Admitting: Pulmonary Disease

## 2020-10-25 ENCOUNTER — Ambulatory Visit (INDEPENDENT_AMBULATORY_CARE_PROVIDER_SITE_OTHER): Payer: BC Managed Care – PPO | Admitting: Pulmonary Disease

## 2020-10-25 ENCOUNTER — Other Ambulatory Visit: Payer: Self-pay

## 2020-10-25 VITALS — BP 128/78 | HR 120 | Temp 98.0°F | Ht 62.0 in | Wt 228.0 lb

## 2020-10-25 DIAGNOSIS — G4733 Obstructive sleep apnea (adult) (pediatric): Secondary | ICD-10-CM

## 2020-10-25 DIAGNOSIS — J383 Other diseases of vocal cords: Secondary | ICD-10-CM

## 2020-10-25 DIAGNOSIS — Z7189 Other specified counseling: Secondary | ICD-10-CM

## 2020-10-25 DIAGNOSIS — J4550 Severe persistent asthma, uncomplicated: Secondary | ICD-10-CM | POA: Diagnosis not present

## 2020-10-25 DIAGNOSIS — Z8616 Personal history of COVID-19: Secondary | ICD-10-CM | POA: Diagnosis not present

## 2020-10-25 DIAGNOSIS — Z79899 Other long term (current) drug therapy: Secondary | ICD-10-CM | POA: Diagnosis not present

## 2020-10-25 DIAGNOSIS — K219 Gastro-esophageal reflux disease without esophagitis: Secondary | ICD-10-CM

## 2020-10-25 MED ORDER — PREDNISONE 10 MG PO TABS: ORAL_TABLET | ORAL | 0 refills | Status: DC

## 2020-10-25 MED ORDER — BREZTRI AEROSPHERE 160-9-4.8 MCG/ACT IN AERO: 2.0000 | INHALATION_SPRAY | Freq: Two times a day (BID) | RESPIRATORY_TRACT | 0 refills | Status: DC

## 2020-10-25 MED ORDER — METHYLPREDNISOLONE ACETATE 80 MG/ML IJ SUSP
120.0000 mg | Freq: Once | INTRAMUSCULAR | Status: AC
  Administered 2020-10-25: 120 mg via INTRAMUSCULAR

## 2020-10-25 NOTE — Assessment & Plan Note (Signed)
Plan: Continue PPI Continue Pepcid

## 2020-10-25 NOTE — Assessment & Plan Note (Signed)
Established with Dr. Rubye Oaks the ENT [facility] Established with speech therapy  Plan: Continue follow-up with ENT Keep appointment next week Contact ENT regarding your upper airway irritation and cough, they may increase your gabapentin We will route note to them today Keep close follow-up with our office in 2 weeks

## 2020-10-25 NOTE — Assessment & Plan Note (Signed)
Plan: Can consider CPAP therapy when symptoms of vocal cord dysfunction are better managed.

## 2020-10-25 NOTE — Assessment & Plan Note (Addendum)
Concern of high-dose ICS contributing to patient's cough  Discussion: We will trial patient coming off of Advair/Wixela 500 and Spiriva Respimat.  We will combine into ICS/LABA/LAMA agent  Plan: Stop Advair Stop Spiriva Respimat Trial of Breztri  Spacer provided today

## 2020-10-25 NOTE — Assessment & Plan Note (Signed)
Plan: Stop Wixela 500 Stop Spiriva Respimat 1.25  Trial of Breztri  Continue Zyrtec Continue Flonase Continue albuterol nebulized meds Continue to work on increasing overall physical mobility and reducing BMI Depo 120 today Prednisone taper tomorrow 2-week follow-up with our office

## 2020-10-25 NOTE — Assessment & Plan Note (Signed)
History of COVID-19 infection in December/2020 Did not require hospitalization Severe persistent asthma symptoms as well as vocal cord dysfunction status post COVID-19 infection  Plan: We will continue to clinically monitor

## 2020-10-25 NOTE — Patient Instructions (Addendum)
You were seen today by Coral Ceo, NP  for:   1. Vocal cord dysfunction  - predniSONE (DELTASONE) 10 MG tablet; 4 tabs for 2 days, then 3 tabs for 2 days, 2 tabs for 2 days, then 1 tab for 2 days, then stop  Dispense: 20 tablet; Refill: 0  Depo 120  Prednisone 10mg  tablet  >>>4 tabs for 2 days, then 3 tabs for 2 days, 2 tabs for 2 days, then 1 tab for 2 days, then stop >>>take with food  >>>take in the morning  >>>start 10/26/20  Continue gabapentin  Continue medications as managed by ENT  2. History of COVID-19  We will continue to monitor you clinically  3. Severe persistent asthma with potential exac  - predniSONE (DELTASONE) 10 MG tablet; 4 tabs for 2 days, then 3 tabs for 2 days, 2 tabs for 2 days, then 1 tab for 2 days, then stop  Dispense: 20 tablet; Refill: 0  Depo 120 today  Prednisone 10mg  tablet  >>>4 tabs for 2 days, then 3 tabs for 2 days, 2 tabs for 2 days, then 1 tab for 2 days, then stop >>>take with food  >>>take in the morning  >>>start 10/26/20  STOP ADVAIR   STOP SPIRIVA   Trial / >>> 2 puffs in the morning right when you wake up, rinse out your mouth after use, 12 hours later 2 puffs, rinse after use >>> Take this daily, no matter what >>> This is not a rescue inhaler  >>>use spacer  >>>start 10/26/20  Only use your albuterol as a rescue medication to be used if you can't catch your breath by resting or doing a relaxed purse lip breathing pattern.  - The less you use it, the better it will work when you need it. - Ok to use up to 2 puffs  every 4 hours if you must but call for immediate appointment if use goes up over your usual need - Don't leave home without it !!  (think of it like the spare tire for your car)   4. Medication management  STOP ADVAIR   STOP SPIRIVA   Trial / START Breztri >>> 2 puffs in the morning right when you wake up, rinse out your mouth after use, 12 hours later 2 puffs, rinse after use >>> Take this  daily, no matter what >>> This is not a rescue inhaler  >>>use spacer  >>>start 10/26/20  5. Gastroesophageal reflux disease, unspecified whether esophagitis present  Continue current medications  GERD management: >>>Avoid laying flat until 2 hours after meals >>>Elevate head of the bed including entire chest >>>Reduce size of meals and amount of fat, acid, spices, caffeine and sweets >>>If you are smoking, Please stop! >>>Decrease alcohol consumption >>>Work on maintaining a healthy weight with normal BMI    6. Coordination of complex care  Continue to work with case management  Continue to work with 12/24/20.  Continue to see ENT and speech therapy  7. OSA (obstructive sleep apnea)  We will consider CPAP start once vocal cord dysfunction is more under control   We recommend today:   Meds ordered this encounter  Medications  . predniSONE (DELTASONE) 10 MG tablet    Sig: 4 tabs for 2 days, then 3 tabs for 2 days, 2 tabs for 2 days, then 1 tab for 2 days, then stop    Dispense:  20 tablet    Refill:  0  . Budeson-Glycopyrrol-Formoterol (BREZTRI AEROSPHERE)  160-9-4.8 MCG/ACT AERO    Sig: Inhale 2 puffs into the lungs in the morning and at bedtime.    Dispense:  5.9 g    Refill:  0    Order Specific Question:   Lot Number?    Answer:   [id number]    Order Specific Question:   Expiration Date?    Answer:   03/22/2022    Order Specific Question:   Manufacturer?    Answer:   [employer] [[id number]]    Order Specific Question:   Quantity    Answer:   4    Follow Up:    Return in about 12 days (around 11/06/2020), or if symptoms worsen or fail to improve, for Follow up with Dr. Everardo All.   Notification of test results are managed in the following manner: If there are  any recommendations or changes to the  plan of care discussed in office today,  we will contact you and let you know what they are. If you do not hear from [place], then your results are normal and you can view  them through your  MyChart account , or a letter will be sent to you. Thank you again for trusting [place] with your care  - Thank you, [facility]    It is flu season:   >>> Best ways to protect herself from the flu: Receive the yearly flu vaccine, practice good hand hygiene washing with soap and also using hand sanitizer when available, eat a nutritious meals, get adequate rest, hydrate appropriately       Please contact the office if your symptoms worsen or you have concerns that you are not improving.   Thank you for choosing [facility] for your healthcare, and for allowing [place] to partner with you on your healthcare journey. I am thankful to be able to provide care to you today.   Elisha Headland FNP-C

## 2020-10-25 NOTE — Assessment & Plan Note (Signed)
Continue follow-up with ENT Continue follow-up with speech therapy Keep close follow-up with our office Continue to work with registered nurse case manager

## 2020-10-25 NOTE — Progress Notes (Signed)
@Patient  ID: , female    DOB: April 16, 1966, 55 y.o.   MRN: [id number]  Chief Complaint  Patient presents with  . Follow-up    Pt states she has been seeing a [id number] and was told that her asthma has not been under control. Pt has been having a lot of problems with coughing and wheezing for about 2 weeks.    Referring provider: [facility]  HPI:  55 year old female never smoker found office for asthma, history of COVID-19, vocal cord dysfunction  PMH: Clear history of COVID-19, obesity, GERD Smoker/ Smoking History: Never Maintenance:  Advair 500, Spiriva Resp 1.25 Pt of: Dr. 57   10/25/2020  - Visit   55 year old female never smoker established in our office with Dr. 57 status post COVID-19 infection.  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  She is also been seen by Dr. Everardo All not found to have post Covid ILD.  Patient has since been seen by [facility] Dr. [facility] at initial consult assessment and plan as listed below:  10/03/20 - ENT [facility] - Dr. 12/01/20:  Ms. Bridgett is a 55 y.o. female with   1. Hoarseness  2. Dysphagia, unspecified type  3. Muscle tension dysphonia  4. Chronic cough  5. Laryngeal spasm  6. Chronic throat clearing  7. Paradoxical vocal cord motion  8. Wheezing   PLAN: Today, I discussed issues and options today. The risks, benefits and alternatives were discussed and questions answered.   I have reviewed the patient's medical record from their referring provider and discussed the findings with the patient.  Patient with MULTIPLE varied laryngological complaints.   Voice: I have recommended voice therapy: Voice Therapy:   Voice therapy involves a patient-centered treatment method to modify behaviors that contribute to voice disorders or in some other way limit normal voice use. Vocal behaviors causing hoarseness are changed in two major ways: (1)  rigorous application of vocal wellness principles and (2) a series of therapeutic techniques superficially designed to change the way the vocal folds vibrate and vocal tract resonates. Both aspects, vocal wellness and voice exercises, are important to the success of the voice therapy. Vocal wellness involves reducing both environmental and behavioral factors that may damage the voice. For example, smoking, excessive alcohol intake, exposure to fumes and chemicals, and frequent shouting are some activities that may damage the vocal folds. Drinking adequate water to lubricate body tissues, regular exercise, and a well-balanced diet are positive practices of vocal hygiene and are vital to a healthy voice.   Voice therapy also involves a series of techniques/exercises designed to address the specific problems associated with the voice disorder. Voice therapy exercises include postural adjustments, breathing, specific neck and throat relaxation exercises, flow phonation, resonant voice, and exercises to mobilize the muscles of the lips, tongue and jaw. Exercises to bring the vocal folds together with decreased or increased force are also an integral part of voice therapy. Paramount to every voice therapy exercise is the ability to hear and feel changes in voice production. The purpose of this training is to help a person develop an awareness of the new voice and to differentiate the "new" from the "old" voice. In this way the patient becomes comfortable with the "new" voice and is able to adjust the voice with little effort. Ultimately, this modified voice quality is integrated into conversational speech in an automatic and consistent manner, without conscious use of  the voice therapy techniques.  Voice therapy, like physical therapy, relies on the patient as the most important participant in returning the voice to its highest functional level. The voice therapist directs the program but it is only successful If the  patient is motivated to change, understands the changes to be made, and is compliant with the voice therapy program (e.g., practices exercises) until the desired results are achieved.  Voice therapy sessions should take place once a week for the first few weeks in order to establish the behavioral changes needed to correct the voice disorder. Common voice disorders such as vocal nodule or muscle tension dysphonia are generally treated in 3-6 sessions, with a very high success rate. In the beginning, the new voice may feel different or "unusual" to the patient, as is true of many new activities (new shoes, new golf swing.) However, with the combined efforts of the voice therapist and the patient, improved voice quality and function can be successfully achieved. These successes are then integrated into speaking technique without having to consciously think about voice therapy technique.   Breathing: I have recommended respiratory retraining therapy Respiratory Retraining Therapy:  Respiratory retraining is a series of techniques for treating persons with Paradoxical Vocal Fold Motion Disorder (PVFMD). PVFMD is characterized by involuntary closing of the vocal folds while attempting to inhale, and sometimes when exhaling. The airway closure prevents air from getting into the lungs, causing breathing difficulty, sometimes associated with wheezing and/or stridor (gasping sound). Other common symptoms associated with PVFMD are chronic cough, frequent throat clearing, globus or foreign body sensation, hoarseness or change in voice quality, and choking. These symptoms can occur constantly of as acute episodes.  The choking sensation, sometimes described as PVFMD episodes or "attacks," can happen at any time. When severe, these episodes become quite panicking, causing repeated visits to ER or even hospitalizations. Often, the condition is misdiagnosed or confused with asthma, especially because it may co-occur in  people with asthma. PVFMD is called the "great masquerader" frequently resulting in unnecessary medical intervention. The intervention found most effective in treating this disorder is speech therapy focusing on respiratory retraining.  An essential aspect of respiratory retraining is reassuring the person that nothing is physically wrong (no present disease or organic/physical obstruction of the airway). Consequently, encouraging the person to learn how to control the breathing will prevent and minimize the severity of possible PVFMD "attacks." It is also necessary to clarify the underlying causes, aggravating circumstances or agents ("triggers"), and predisposing conditions related to PVFMD, such as acid reflux, airborne irritant inductions, emotional stress, and muscular tension.  The keys to therapy for PVFMD include muscle relaxation; retraining of the breath cycle to increase the individual's awareness of relaxed rhythmic cycles, and reduce the effort associated with respiration; and treatment of acid reflux.  The most important aspect of this treatment relies on the fact that the person with PVFMD is actually able to control his or her vocal muscles through regular, rhythmic and relaxed exhalation and passive inhalation. Great emphasis is placed on the abdominal breathing pattern and restoring diaphragm function, which will reduce excessive tension of the muscles in the chest, shoulder elevation and neck contraction.  People with PVFMD benefit from brief periods of practice distributed frequently over time (5 to 10 minutes several times a day). Initially, therapy should be scheduled in frequent short sessions, depending on the severity of the case, and as the symptoms improve with habituated relaxed rhythmic breathing, the time between sessions may increase. The  average time for most cases varies between 4 to 6 therapy sessions, but some people may report significant changes and progress after 2 or 3  sessions.  Dysphagia: I have recommended barium esophagram. Barium Esophagram to further assess esophageal function. This is an x-ray swallowing test that will grossly assess the motility of the esophagus, look for strictures or other narrowed areas, assess the lower esophageal sphincter function, etc. Sometimes gastroesophageal reflux/regurgitation can be identified on exam. I will personally review this exam and make recommendations for next best steps in management.  Cough:  I will try gabapentin for chronic cough. Neurontin (gabapentin) is an anti-seizure and nerve pain medication. It has many "off-label" usages including chronic neuropathic cough. It is thought that symptoms of chronic cough may be due to a sensitivity problem of the nerves in the throat.   Medication Schedule:   1 tablet three times a day for 7 days  Most of the time it will take patients up to 4 weeks to notice a difference in their cough. About 70% of patients notice enough improvement in their symptoms to remain on the medication for 3 months. At that time, we will attempt to taper the dosage down. Our goal is to get you the lowest possible dosage that will keep your symptoms under control.   A certain percentage of patients will not respond to the medication. However, should this be the case with you, DO NOT STOP THE MEDICATION ABRUPTLY. We must taper you down the same way you were tapered up.   If you have any questions or concerns about the medication or how you are feeling, please contact our office.   I have asked the patient to follow up on the same day as above testing - 1 month.  Medical Decision Making [id number] or [id number]:  Number and Complexity of Problems Addressed  Complexity - Moderate Moderate  . 1 or more chronic illnesses with exacerbation, progression, or side effects of treatment; or  . 2 or more stable chronic illnesses; or . 1 undiagnosed new problem with uncertain prognosis; or  . 1 acute illness  with systemic symptoms; or  . 1 acute complicated injury   Amount or Complexity of Review - Moderate (Meets at least 1 out of 3 categories)   Moderate (Must meet the requirements of at least 1 out of 3 categories)  Category 1: Tests, documents, or independent historian(s) - Any combination of 3 from the following:  . Review of prior external note(s) from each unique source*;  . Review of the result(s) of each unique test*;  . Ordering of each unique test*;  . Assessment requiring an independent historian(s) or   Category 2: Independent interpretation of tests  . Independent interpretation of a test performed by another physician/other qualified health care professional (not separately reported); or   Category 3: Discussion of management or test interpretation  . Discussion of management or test interpretation with external physician/other qualified health care professional\appropriate source (not separately reported)   Moderate risk of morbidity from additional diagnostic testing or treatment  Examples: . Prescription drug management, BSS, MBS, FEES, stroboscopy, TFL  Electronically signed by: Eino Farber, DO 10/03/20 1024    Patient also established with speech therapy in December/2021 with [facility].  Patient presented to her office today reporting the ENT informed her that her asthma is not well controlled and she needed to see our office.  Patient reports she is had worsening cough, wheezing over the last week.  This  was exacerbated when patient was waiting online and another patient in the waiting area had a strong cologne on.  Patient reports adherence to her medications.  She reports she utilizes her rescue inhaler every 6-8 hours.  Most recently used that 1:30 PM today.  Patient continues to utilize gabapentin for cough neuropathy is managed by ENT.  She has not yet contacted them to notify that her symptoms have worsened over the last week.  Patient presents  today with her registered nurse case manager Lisabeth Register, RN, CCM.  ACT score today is 6.   Questionaires / Pulmonary Flowsheets:   ACT:  Asthma Control Test ACT Total Score  10/25/2020 6  07/27/2020 9  07/13/2020 8    MMRC: mMRC Dyspnea Scale mMRC Score  05/30/2020 3  04/03/2020 1    Epworth:  No flowsheet data found.  Tests:   Imaging: HRCT 03/30/2020 There is no evidence of interstitial lung disease.  Mild diffuse central airway thickening is noted which can be seen with asthma or bronchitis.   PFTs: 01/06/2020 FVC 1.27 [39%], FEV1 1.06 [41%], F/F 84 TLC 3.97 [83%], DLCO 20.20 [104%]  Labs: IgE 01/06/2020- 18 CBC 10/11/2019-WBC 8.7, eos 2%, absolute eosinophil count 174  09/15/2019-SARS-CoV-2-positive  05/30/2020-home sleep study-AHI 8.5, SaO2 low 75%  FENO:  No results found for: NITRICOXIDE  PFT: PFT Results Latest Ref Rng & Units 01/06/2020  FVC-Pre L 1.20  FVC-Predicted Pre % 37  FVC-Post L 1.27  FVC-Predicted Post % 39  Pre FEV1/FVC % % 70  Post FEV1/FCV % % 84  FEV1-Pre L 0.84  FEV1-Predicted Pre % 33  FEV1-Post L 1.06  DLCO uncorrected ml/min/mmHg 20.20  DLCO UNC% % 104  DLCO corrected ml/min/mmHg 20.20  DLCO COR %Predicted % 104  DLVA Predicted % 133  TLC L 3.97  TLC % Predicted % 83  RV % Predicted % 91    WALK:  SIX MIN WALK 07/13/2020 05/30/2020 03/22/2020 11/02/2019  Medications NONE - - -  Supplimental Oxygen during Test? (L/min) No No - No  Laps 10 - - -  Partial Lap (in Meters) 1 - - -  Baseline BP (sitting) 120/88 - - -  Baseline Heartrate 74 - - -  Baseline Dyspnea (Borg Scale) 0.5 - - -  Baseline Fatigue (Borg Scale) 2 - - -  Baseline SPO2 97 - - -  2 Minute Oxygen Saturation % - - 95 -  2 Minute HR - - 113 -  4 Minute Oxygen Saturation % - - 95 -  4 Minute HR - - 112 -  6 Minute Oxygen Saturation % - - 96 -  6 Minute HR - - 110 -  BP (sitting) 160/106 - - -  Heartrate 104 - - -  Dyspnea (Borg Scale) 7 - - -  Fatigue (Borg  Scale) 5 - - -  SPO2 96 - - -  BP (sitting) 156/100 - - -  Heartrate 93 - - -  SPO2 96 - - -  Stopped or Paused before Six Minutes Yes - - -  Other Symptoms at end of Exercise To catch her breath patient has Vocal cord dysfunction - - -  Interpretation (No Data) - - -  Distance Completed 341 - - -  Distance Completed 1 - - -  Tech Comments: Patient walked slow pace and maintained good sats. Had to stop once to catch her breath due to Vocal cord dysfunction, completed 6 min walk pt could not complete 1 full lap.  she did about 1/4 of a lap and dropped to 84% on room air.  she did a quick recovery once seated. - Patient walked at an average pace only able to complete one lap. Patient stopped three times while doing the lap to cough and to catch her breath. Patient had complaints of severe SOB and chest tightness and also wheezed the entire time while walking.    Imaging: No results found.  Lab Results:  CBC    Component Value Date/Time   WBC 8.7 10/11/2019 2010   RBC 4.80 10/11/2019 2010   HGB 13.5 10/11/2019 2010   HCT 42.8 10/11/2019 2010   PLT 223 10/11/2019 2010   MCV 89.2 10/11/2019 2010   MCH 28.1 10/11/2019 2010   MCHC 31.5 10/11/2019 2010   RDW 15.1 10/11/2019 2010   LYMPHSABS 3.5 10/11/2019 2010   MONOABS 0.5 10/11/2019 2010   EOSABS 0.2 10/11/2019 2010   BASOSABS 0.1 10/11/2019 2010    BMET    Component Value Date/Time   NA 139 01/06/2020 1122   K 4.0 01/06/2020 1122   CL 101 01/06/2020 1122   CO2 30 01/06/2020 1122   GLUCOSE 96 01/06/2020 1122   BUN 13 01/06/2020 1122   CREATININE 0.85 01/06/2020 1122   CALCIUM 9.6 01/06/2020 1122   GFRNONAA >60 10/11/2019 2010   GFRAA >60 10/11/2019 2010    BNP No results found for: BNP  ProBNP No results found for: PROBNP  Specialty Problems      Pulmonary Problems   Vocal cord dysfunction    Patient evaluated in March/2021 at [facility] with Dr. Jenne Pane.  Findings were positive for paradoxical vocal cord  motion during relaxed breathing felt to be contributing to her shortness of breath Referred to speech pathology, now referred to [facility]      OSA (obstructive sleep apnea)   Severe persistent asthma without complication      Allergies  Allergen Reactions  . Other Hives  . Sulfa Antibiotics Hives  . Bee Venom Hives    Immunization History  Administered Date(s) Administered  . Influenza Whole 07/04/2019, 07/26/2020    Past Medical History:  Diagnosis Date  . Asthma   . DVT (deep venous thrombosis) (HCC)   . High cholesterol   . Pneumonia     Tobacco History: Social History   Tobacco Use  Smoking Status Never Smoker  Smokeless Tobacco Never Used   Counseling given: Not Answered   Continue to not smoke  Outpatient Encounter Medications as of 10/25/2020  Medication Sig  . albuterol (PROVENTIL) (2.5 MG/3ML) 0.083% nebulizer solution Take 3 mLs (2.5 mg total) by nebulization every 6 (six) hours as needed for wheezing or shortness of breath.  Marland Kitchen albuterol (VENTOLIN HFA) 108 (90 Base) MCG/ACT inhaler INHALE 1 TO 2 PUFFS BY MOUTH EVERY 4 TO 6 HOURS  . Budeson-Glycopyrrol-Formoterol (BREZTRI AEROSPHERE) 160-9-4.8 MCG/ACT AERO Inhale 2 puffs into the lungs in the morning and at bedtime.  . cetirizine (ZYRTEC) 10 MG tablet Take 1 tablet by mouth once daily  . famotidine (PEPCID) 40 MG tablet Take 1 tablet (40 mg total) by mouth at bedtime.  . fluticasone (FLONASE) 50 MCG/ACT nasal spray Place 1 spray into both nostrils daily.  . Fluticasone-Salmeterol (ADVAIR) 500-50 MCG/DOSE AEPB INHALE 1 DOSE BY MOUTH IN THE MORNING AND AT BEDTIME  . gabapentin (NEURONTIN) 100 MG capsule Take 100 mg by mouth 3 (three) times daily.  Marland Kitchen losartan-hydrochlorothiazide (HYZAAR) 50-12.5 MG tablet Take 1 tablet by  mouth daily.  Marland Kitchen omeprazole (PRILOSEC) 40 MG capsule Take 1 capsule (40 mg total) by mouth daily.  . ondansetron (ZOFRAN-ODT) 8 MG disintegrating tablet Take 8 mg by mouth  every 6 (six) hours as needed.  . predniSONE (DELTASONE) 10 MG tablet 4 tabs for 2 days, then 3 tabs for 2 days, 2 tabs for 2 days, then 1 tab for 2 days, then stop  . SPIRIVA RESPIMAT 1.25 MCG/ACT AERS INHALE 2 SPRAY(S) BY MOUTH ONCE DAILY   No facility-administered encounter medications on file as of 10/25/2020.     Review of Systems  Review of Systems  Constitutional: Positive for fatigue. Negative for activity change and fever.  HENT: Negative for congestion, sinus pressure, sinus pain and sore throat.   Respiratory: Positive for cough, shortness of breath and wheezing.   Cardiovascular: Negative for chest pain and palpitations.  Gastrointestinal: Negative for diarrhea, nausea and vomiting.  Musculoskeletal: Negative for arthralgias.  Neurological: Negative for dizziness.  Psychiatric/Behavioral: Negative for sleep disturbance. The patient is not nervous/anxious.      Physical Exam  BP 128/78 (BP Location: Left Wrist, Cuff Size: Normal)   Pulse (!) 120   Temp 98 F (36.7 C) (Other (Comment)) Comment (Src): wrist  Ht 5\' 2"  (1.575 m)   Wt 228 lb (103.4 kg)   SpO2 96%   BMI 41.70 kg/m   Wt Readings from Last 5 Encounters:  10/25/20 228 lb (103.4 kg)  08/31/20 227 lb 6.4 oz (103.1 kg)  07/27/20 228 lb 3.2 oz (103.5 kg)  07/13/20 230 lb (104.3 kg)  05/30/20 230 lb 6 oz (104.5 kg)    BMI Readings from Last 5 Encounters:  10/25/20 41.70 kg/m  08/31/20 41.59 kg/m  07/27/20 41.74 kg/m  07/13/20 42.07 kg/m  05/30/20 42.14 kg/m     Physical Exam Vitals and nursing note reviewed.  Constitutional:      General: She is not in acute distress.    Appearance: Normal appearance. She is obese.  HENT:     Head: Normocephalic and atraumatic.     Right Ear: External ear normal.     Left Ear: External ear normal.     Nose: Nose normal. No congestion.     Mouth/Throat:     Mouth: Mucous membranes are moist.     Pharynx: Oropharynx is clear.  Eyes:     Pupils: Pupils are  equal, round, and reactive to light.  Cardiovascular:     Rate and Rhythm: Normal rate and regular rhythm.     Pulses: Normal pulses.     Heart sounds: Normal heart sounds. No murmur heard.   Pulmonary:     Effort: Pulmonary effort is normal. No respiratory distress.     Breath sounds: No decreased air movement. Examination of the right-upper field reveals wheezing. Examination of the left-upper field reveals wheezing. Wheezing (Inspiratory and expiratory wheezes, upper airway wheeze) present. No decreased breath sounds or rales.  Musculoskeletal:     Cervical back: Normal range of motion.  Skin:    General: Skin is warm and dry.     Capillary Refill: Capillary refill takes less than 2 seconds.  Neurological:     General: No focal deficit present.     Mental Status: She is alert and oriented to person, place, and time. Mental status is at baseline.     Gait: Gait normal.  Psychiatric:        Mood and Affect: Mood normal.        Behavior: Behavior  normal.        Thought Content: Thought content normal.        Judgment: Judgment normal.       Assessment & Plan:   OSA (obstructive sleep apnea) Plan: Can consider CPAP therapy when symptoms of vocal cord dysfunction are better managed.  Severe persistent asthma without complication Plan: Stop Wixela 500 Stop Spiriva Respimat 1.25  Trial of Breztri  Continue Zyrtec Continue Flonase Continue albuterol nebulized meds Continue to work on increasing overall physical mobility and reducing BMI Depo 120 today Prednisone taper tomorrow 2-week follow-up with our office  GERD (gastroesophageal reflux disease) Plan: Continue PPI Continue Pepcid   Coordination of complex care Continue follow-up with ENT Continue follow-up with speech therapy Keep close follow-up with our office Continue to work with registered nurse case manager  History of COVID-19 History of COVID-19 infection in December/2020 Did not require  hospitalization Severe persistent asthma symptoms as well as vocal cord dysfunction status post COVID-19 infection  Plan: We will continue to clinically monitor  Medication management Concern of high-dose ICS contributing to patient's cough  Discussion: We will trial patient coming off of Advair/Wixela 500 and Spiriva Respimat.  We will combine into ICS/LABA/LAMA agent  Plan: Stop Advair Stop Spiriva Respimat Trial of Breztri  Spacer provided today  Vocal cord dysfunction Established with Dr. Rubye Oaks the ENT [facility] Established with speech therapy  Plan: Continue follow-up with ENT Keep appointment next week Contact ENT regarding your upper airway irritation and cough, they may increase your gabapentin We will route note to them today Keep close follow-up with our office in 2 weeks    Return in about 12 days (around 11/06/2020), or if symptoms worsen or fail to improve, for Follow up with Dr. Everardo All.   Coral Ceo, NP 10/25/2020   This appointment required 41 minutes of patient care (this includes precharting, chart review, review of results, face-to-face care, etc.).

## 2020-10-27 ENCOUNTER — Other Ambulatory Visit: Payer: Self-pay | Admitting: Pulmonary Disease

## 2020-10-27 ENCOUNTER — Other Ambulatory Visit: Payer: Self-pay | Admitting: Primary Care

## 2020-10-27 DIAGNOSIS — K219 Gastro-esophageal reflux disease without esophagitis: Secondary | ICD-10-CM

## 2020-10-27 DIAGNOSIS — J383 Other diseases of vocal cords: Secondary | ICD-10-CM

## 2020-10-27 DIAGNOSIS — J302 Other seasonal allergic rhinitis: Secondary | ICD-10-CM

## 2020-10-30 ENCOUNTER — Other Ambulatory Visit: Payer: Self-pay | Admitting: Pulmonary Disease

## 2020-10-30 DIAGNOSIS — J383 Other diseases of vocal cords: Secondary | ICD-10-CM

## 2020-10-30 DIAGNOSIS — K219 Gastro-esophageal reflux disease without esophagitis: Secondary | ICD-10-CM

## 2020-11-06 ENCOUNTER — Ambulatory Visit (INDEPENDENT_AMBULATORY_CARE_PROVIDER_SITE_OTHER): Payer: BC Managed Care – PPO | Admitting: Pulmonary Disease

## 2020-11-06 ENCOUNTER — Other Ambulatory Visit: Payer: Self-pay

## 2020-11-06 ENCOUNTER — Encounter: Payer: Self-pay | Admitting: Pulmonary Disease

## 2020-11-06 VITALS — BP 118/84 | HR 113 | Temp 98.2°F | Ht 62.0 in | Wt 227.0 lb

## 2020-11-06 DIAGNOSIS — J383 Other diseases of vocal cords: Secondary | ICD-10-CM

## 2020-11-06 DIAGNOSIS — U099 Post covid-19 condition, unspecified: Secondary | ICD-10-CM | POA: Diagnosis not present

## 2020-11-06 DIAGNOSIS — R053 Chronic cough: Secondary | ICD-10-CM

## 2020-11-06 NOTE — Patient Instructions (Addendum)
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 Prescribed today --CONTINUE Albuterol AS NEEDED for shortness of breath or wheezing --Counseled on biologic options including Fasenra, Dupixent and Nucala --Start enrollment with [facility]  Follow-up with me in 3 months

## 2020-11-06 NOTE — Progress Notes (Signed)
@Patient  ID: , female    DOB: 11-04-1965, 55 y.o.   MRN: [id number]  Chief Complaint  Patient presents with  . Follow-up    Breztri working well. Having persistent dry cough. SOB with exertion. No improvements sine last seen [id number] 2 weeks ago.    HPI:  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 Worker's comp representative present during this visit.  She is being managed by a multidisciplinary team voice therapy with Speech, ENT and Pulmonary. She reports return of dry persistent cough after completing her steroid taper. She was changed from 57 to [place]. She reports the wheezing has improved by 50%. She did not have a change in character with the cough with steroids. She uses the albuterol 4 times a day with some relief of dyspnea. Denies anxiety and depression. Reports frustration. She talks to her husband as an outlet. She has had to adjust her regular activities. Currently on gabapentin which is being titrated up. She is scheduled for barium swallow.   Questionaires / Pulmonary Flowsheets:   ACT:  Asthma Control Test ACT Total Score  10/25/2020 6  07/27/2020 9  07/13/2020 8  Tests:   Imaging: HRCT 03/30/2020 There is no evidence of interstitial lung disease.  Mild diffuse central airway thickening is noted which can be seen with asthma or bronchitis.   PFTs: 01/06/2020 FVC 1.27 [39%], FEV1 1.06 [41%], F/F 84 TLC 3.97 [83%], DLCO 20.20 [104%]  Labs: IgE 01/06/2020- 18 CBC 10/11/2019-WBC 8.7, eos 2%, absolute eosinophil count 200  09/15/2019-SARS-CoV-2-positive  05/30/2020-home sleep study-AHI 8.5, SaO2 low 75%  FENO:  No results found for: NITRICOXIDE  PFT: PFT Results Latest Ref Rng & Units 01/06/2020  FVC-Pre L 1.20  FVC-Predicted Pre % 37  FVC-Post L 1.27  FVC-Predicted Post % 39  Pre FEV1/FVC % % 70  Post FEV1/FCV % % 84  FEV1-Pre L  0.84  FEV1-Predicted Pre % 33  FEV1-Post L 1.06  DLCO uncorrected ml/min/mmHg 20.20  DLCO UNC% % 104  DLCO corrected ml/min/mmHg 20.20  DLCO COR %Predicted % 104  DLVA Predicted % 133  TLC L 3.97  TLC % Predicted % 83  RV % Predicted % 91    WALK:  SIX MIN WALK 07/13/2020 05/30/2020 03/22/2020 11/02/2019  Medications NONE - - -  Supplimental Oxygen during Test? (L/min) No No - No  Laps 10 - - -  Partial Lap (in Meters) 1 - - -  Baseline BP (sitting) 120/88 - - -  Baseline Heartrate 74 - - -  Baseline Dyspnea (Borg Scale) 0.5 - - -  Baseline Fatigue (Borg Scale) 2 - - -  Baseline SPO2 97 - - -  2 Minute Oxygen Saturation % - - 95 -  2 Minute HR - - 113 -  4 Minute Oxygen Saturation % - - 95 -  4 Minute HR - - 112 -  6 Minute Oxygen Saturation % - - 96 -  6 Minute HR - - 110 -  BP (sitting) 160/106 - - -  Heartrate 104 - - -  Dyspnea (Borg Scale) 7 - - -  Fatigue (Borg Scale) 5 - - -  SPO2 96 - - -  BP (sitting) 156/100 - - -  Heartrate 93 - - -  SPO2 96 - - -  Stopped or Paused before Six Minutes Yes - - -  Other Symptoms at end of Exercise To catch her breath patient has Vocal cord dysfunction - - -  Interpretation (No Data) - - -  Distance Completed 341 - - -  Distance Completed 1 - - -  Tech Comments: Patient walked slow pace and maintained good sats. Had to stop once to catch her breath due to Vocal cord dysfunction, completed 6 min walk pt could not complete 1 full lap.  she did about 1/4 of a lap and dropped to 84% on room air.  she did a quick recovery once seated. - Patient walked at an average pace only able to complete one lap. Patient stopped three times while doing the lap to cough and to catch her breath. Patient had complaints of severe SOB and chest tightness and also wheezed the entire time while walking.    Imaging: No results found.  Lab Results:  CBC    Component Value Date/Time   WBC 8.7 10/11/2019 2010   RBC 4.80 10/11/2019 2010   HGB 13.5  10/11/2019 2010   HCT 42.8 10/11/2019 2010   PLT 223 10/11/2019 2010   MCV 89.2 10/11/2019 2010   MCH 28.1 10/11/2019 2010   MCHC 31.5 10/11/2019 2010   RDW 15.1 10/11/2019 2010   LYMPHSABS 3.5 10/11/2019 2010   MONOABS 0.5 10/11/2019 2010   EOSABS 0.2 10/11/2019 2010   BASOSABS 0.1 10/11/2019 2010    Specialty Problems      Pulmonary Problems   Vocal cord dysfunction    Patient evaluated in March/2021 at [facility] with Dr. Jenne Pane.  Findings were positive for paradoxical vocal cord motion during relaxed breathing felt to be contributing to her shortness of breath Referred to speech pathology, now referred to [name] [facility]      OSA (obstructive sleep apnea)   Severe persistent asthma without complication      Allergies  Allergen Reactions  . Other Hives  . Sulfa Antibiotics Hives  . Bee Venom Hives    Past Medical History:  Diagnosis Date  . Asthma   . DVT (deep venous thrombosis) (HCC)   . High cholesterol   . Pneumonia      Outpatient Encounter Medications as of 11/06/2020  Medication Sig  . albuterol (PROVENTIL) (2.5 MG/3ML) 0.083% nebulizer solution Take 3 mLs (2.5 mg total) by nebulization every 6 (six) hours as needed for wheezing or shortness of breath.  Marland Kitchen albuterol (VENTOLIN HFA) 108 (90 Base) MCG/ACT inhaler INHALE 1 TO 2 PUFFS BY MOUTH EVERY 4 TO 6 HOURS  . Budeson-Glycopyrrol-Formoterol (BREZTRI AEROSPHERE) 160-9-4.8 MCG/ACT AERO Inhale 2 puffs into the lungs in the morning and at bedtime.  . cetirizine (ZYRTEC) 10 MG tablet Take 1 tablet by mouth once daily  . famotidine (PEPCID) 40 MG tablet Take 1 tablet (40 mg total) by mouth at bedtime.  . fluticasone (FLONASE) 50 MCG/ACT nasal spray Place 1 spray into both nostrils daily.  Marland Kitchen gabapentin (NEURONTIN) 100 MG capsule Take 100 mg by mouth 3 (three) times daily.  Marland Kitchen losartan-hydrochlorothiazide (HYZAAR) 50-12.5 MG tablet Take 1 tablet by mouth daily.  Marland Kitchen omeprazole (PRILOSEC) 40 MG  capsule Take 1 capsule by mouth once daily  . ondansetron (ZOFRAN-ODT) 8 MG disintegrating tablet Take 8 mg by mouth every 6 (six) hours as needed.  . Fluticasone-Salmeterol (ADVAIR) 500-50 MCG/DOSE AEPB INHALE 1 DOSE BY MOUTH IN THE MORNING AND AT BEDTIME (Patient not taking: Reported on 11/06/2020)  . predniSONE (DELTASONE) 10 MG tablet 4 tabs for 2 days,  then 3 tabs for 2 days, 2 tabs for 2 days, then 1 tab for 2 days, then stop (Patient not taking: Reported on 11/06/2020)  . SPIRIVA RESPIMAT 1.25 MCG/ACT AERS INHALE 2 SPRAY(S) BY MOUTH ONCE DAILY (Patient not taking: Reported on 11/06/2020)   No facility-administered encounter medications on file as of 11/06/2020.     Review of Systems Review of Systems  Constitutional: Negative for chills, diaphoresis, fever, malaise/fatigue and weight loss.  HENT: Negative for congestion.   Respiratory: Positive for cough, shortness of breath and wheezing. Negative for hemoptysis and sputum production.   Cardiovascular: Negative for chest pain, palpitations and leg swelling.      Physical Exam  BP 118/84 (BP Location: Left Arm, Cuff Size: Normal)   Pulse (!) 113   Temp 98.2 F (36.8 C)   Ht 5\' 2"  (1.575 m)   Wt 227 lb (103 kg)   SpO2 97%   BMI 41.52 kg/m   Physical Exam: General: Well-appearing, no acute distress HENT: [place], AT Eyes: EOMI, no scleral icterus Respiratory: Clear to auscultation bilaterally.  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  Asthma remains uncontrolled despite triple therapy. Steroids provided partial benefit with return of symptoms once course completed. Discussed biologic agents including Xolair  (anti-IgE), Nucala (anti-IL-5), Fasenra (anti-IL-5 receptor alpha) and Dupixent (anti-IL-4 receptor subunit alpha). I believe patient would benefit from biologic agent given uncontrolled symptoms, multiple exacerbations and eosinophilia. We discussed the risks and benefits of these type of medications including anaphylaxis. Patient expressed understanding and would like to pursue treatment if eligible.  Covid long hauler manifested as chronic cough, vocal cord dysfunction - uncontrolled Severe eosinophilia persistent asthma -uncontrolled --CONTINUE Breztri 160-9-4.5 mcg TWO puffs TWICE daily. Prescribed today --CONTINUE Albuterol AS NEEDED for shortness of breath or wheezing --Counseled on biologic options including Fasenra, Dupixent and Nucala --Continue therapy with Speech and ENT  OSA --Did not discuss at this visit. May benefit from initiation of auto-PAP  Health Maintenance Immunization History  Administered Date(s) Administered  . Influenza Whole 07/04/2019, 07/26/2020   No orders of the defined types were placed in this encounter.  No orders of the defined types were placed in this encounter.  Return in about 3 months (around 02/03/2021).  I have spent a total time of 31-minutes on the day of the appointment reviewing prior documentation, coordinating care and discussing medical diagnosis and plan with the patient/family. Imaging, labs and tests included in this note have been reviewed and interpreted independently by me.  02/05/2021, M.D. [facility] Pulmonary/Critical Care Medicine 11/06/2020 2:39 PM

## 2020-11-07 ENCOUNTER — Telehealth: Payer: Self-pay | Admitting: Pharmacy Technician

## 2020-11-07 DIAGNOSIS — J302 Other seasonal allergic rhinitis: Secondary | ICD-10-CM

## 2020-11-07 DIAGNOSIS — J4550 Severe persistent asthma, uncomplicated: Secondary | ICD-10-CM

## 2020-11-07 MED ORDER — ALBUTEROL SULFATE HFA 108 (90 BASE) MCG/ACT IN AERS: INHALATION_SPRAY | RESPIRATORY_TRACT | 3 refills | Status: DC

## 2020-11-07 MED ORDER — FLUTICASONE PROPIONATE 50 MCG/ACT NA SUSP: 1.0000 | Freq: Every day | NASAL | 11 refills | Status: AC

## 2020-11-07 NOTE — Telephone Encounter (Signed)
Submitted a Prior Authorization request to [employer] for [facility] via Cover My Meds. Will update once we receive a response.   (Key: [id number])

## 2020-11-09 NOTE — Telephone Encounter (Signed)
Received notification from [facility] regarding a prior authorization for [facility]. Authorization has been APPROVED from 11/07/20 to 11/06/21.   Authorization # Key: [id number] Phone # [phone]

## 2020-11-14 ENCOUNTER — Telehealth: Payer: Self-pay | Admitting: *Deleted

## 2020-11-14 NOTE — Telephone Encounter (Signed)
Patient is returning phone call. Patient phone number is [phone].

## 2020-11-14 NOTE — Telephone Encounter (Signed)
I called [facility] and they I was told they do not handle workman's comp in this area and transferred me to Numa at [phone] ex [phone], provided the patient's ID # and was told they could not pull up anyone with that ID #.  Called and left patient a VM as indicated on DPR regarding PA needed for a medication.  Received a request for a PA for Fluticasone Propionate as it is not covered under her plan.  Will await a return call from patient.

## 2020-11-14 NOTE — Telephone Encounter (Signed)
Attempted to start a PA Key:  [id number], after completing the information I reached a screen stating that his medication is not covered under the patient's plan

## 2020-11-14 NOTE — Telephone Encounter (Signed)
Called and spoke with the pharmacy staff member, Lupita Leash at [facility] regarding the fluticasone propionate and Albuterol sulfate HFA, advised that the prescriptions were ready for pick up.  No PA required.  Nothing further needed at this time.

## 2020-11-14 NOTE — Telephone Encounter (Signed)
Call made to patient, confirmed DOB. Made aware her medications have been sent to her pharmacy and are ready for pick up (see notes from 11/14/20). Voiced understanding.   Nothing further is needed at this time.

## 2020-11-22 ENCOUNTER — Encounter: Payer: Self-pay | Admitting: Pulmonary Disease

## 2020-11-23 ENCOUNTER — Other Ambulatory Visit: Payer: Self-pay | Admitting: Pulmonary Disease

## 2020-11-29 ENCOUNTER — Ambulatory Visit: Payer: No Typology Code available for payment source | Admitting: Pulmonary Disease

## 2020-11-29 ENCOUNTER — Encounter: Payer: Self-pay | Admitting: *Deleted

## 2020-11-29 NOTE — Telephone Encounter (Signed)
Please advise on patient mychart message  I have a work note taking me out till April 1,2022. Can you write me a work note and mail it to me extending the time out of work until my next visit in May?

## 2020-11-29 NOTE — Telephone Encounter (Signed)
Jane Cutter, MD  Lbpu Triage Pool 2 hours ago (1:53 PM)     Ok to send a work release note. Please use similar template from last note    Message text     Work note has been sent to pt. Nothing further needed.

## 2020-12-04 ENCOUNTER — Telehealth: Payer: Self-pay | Admitting: *Deleted

## 2020-12-04 ENCOUNTER — Telehealth: Payer: Self-pay | Admitting: Pulmonary Disease

## 2020-12-04 NOTE — Telephone Encounter (Signed)
PA initiated via [facility] for the omeprazole.  Additional papers will be sent.   KEY:  [id number] NAME:  Jane Bennett DOB:  [birth year]  Will forward to LR to follow up on.

## 2020-12-04 NOTE — Telephone Encounter (Signed)
Noted. Left message with Marcelino Duster to make aware we have received fax.   Nothing further needed at this time.

## 2020-12-04 NOTE — Telephone Encounter (Signed)
Please check on status fax report regarding patient's job offer for me to review.

## 2020-12-04 NOTE — Telephone Encounter (Signed)
Patrice please advise if you received a fax.

## 2020-12-04 NOTE — Telephone Encounter (Signed)
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

## 2020-12-04 NOTE — Telephone Encounter (Signed)
LMTCB  No fax has been received. Checked the front and pod cubbies.

## 2020-12-05 MED ORDER — BREZTRI AEROSPHERE 160-9-4.8 MCG/ACT IN AERO: 2.0000 | INHALATION_SPRAY | Freq: Two times a day (BID) | RESPIRATORY_TRACT | 6 refills | Status: DC

## 2020-12-06 NOTE — Telephone Encounter (Signed)
Checked status of PA via [email or web address], states that the PA was archived on 11/27/20 with no option to re-initiate the request.  lmtcb X1 for pt to see if she is having difficulty getting this medication- since it is also available OTC she might be getting it that way, or the copay might be low.

## 2020-12-06 NOTE — Telephone Encounter (Signed)
Document received from [facility] that stated that the omeprazole has been cancelled for the PA due to not requiring the PA to be done.  Will sign off at this time.

## 2020-12-07 NOTE — Telephone Encounter (Signed)
Per 12/04/20 telephone encounter [place], LPN, Fax was received and placed in Dr. George Hugh box to review. I have looked and fax is in Dr. George Hugh C pod box to be reviewed. Dr. Everardo All will return to office 12/10/20.  Message routed to Dr. Everardo All as Jane Bennett

## 2020-12-07 NOTE — Telephone Encounter (Signed)
Will forward to Dr Shirlee More nurse to follow up on paperwork.

## 2020-12-10 ENCOUNTER — Telehealth: Payer: Self-pay | Admitting: Pulmonary Disease

## 2020-12-10 NOTE — Telephone Encounter (Signed)
Letter and paperwork for work signed and faxed to [employer] at [phone].

## 2020-12-10 NOTE — Telephone Encounter (Signed)
I have reviewed documentation from [facility] regarding job position for Jane Bennett. I have contacted Jane Bennett that this job meets the physical requirements that she is able to perform.  Work release note with restrictions for a sedentary position with no lifting and no vocal communication will be faxed to [facility].

## 2020-12-13 NOTE — Telephone Encounter (Signed)
Per 12/10/20 encounter by Hendricks Limes, CMA,  Letter and paperwork was signed and faxed to [facility]. Nothing further at this time.    12/10/20, Hendricks Limes, CMA encounter- Letter and paperwork for work signed and faxed to [employer] at [phone].

## 2020-12-13 NOTE — Telephone Encounter (Signed)
Jane Bennett had stated that the forms had been in Dr Shirlee More lookat before. Do you still have this? Just need the fax number to send the letter Dr Everardo All did.

## 2020-12-13 NOTE — Telephone Encounter (Signed)
Forms were placed in Dr. George Hugh box per [place] in previous encounter. I checked Dr. Rolanda Lundborg box 12/07/20, to make sure forms were in box for Dr. Everardo All to complete once she returned to office. Per 12/10/20 encounter by Dr. Everardo All she received forms and work note was ready to be faxed.    Message routed to Leotis Shames, RN

## 2020-12-14 ENCOUNTER — Telehealth: Payer: Self-pay | Admitting: Pulmonary Disease

## 2020-12-14 NOTE — Telephone Encounter (Signed)
Paperwork and letter refaxed.

## 2020-12-18 ENCOUNTER — Telehealth: Payer: Self-pay | Admitting: Pulmonary Disease

## 2020-12-18 NOTE — Telephone Encounter (Signed)
Paperwork has been faxed for the 3rd time. A copy of paperwork has been placed in mail. Original documents will be sent to scan.

## 2021-01-01 ENCOUNTER — Telehealth: Payer: Self-pay | Admitting: *Deleted

## 2021-01-01 NOTE — Telephone Encounter (Signed)
PA initiated for this pt for cetrizine.    KEY: [id number] NAME: Jane Bennett DOB:06/01/1966  Will forward to LR to follow up on.

## 2021-01-03 NOTE — Telephone Encounter (Signed)
Medication name and strength: Cetirizine HCl 10 mg PA approved/denied: denied Approval dates:  If denied, reason for denial: [employer] is sending form with more information needed, fax not yet received.

## 2021-01-07 MED ORDER — IPRATROPIUM BROMIDE 0.06 % NA SOLN: 2.0000 | Freq: Two times a day (BID) | NASAL | 3 refills | Status: DC

## 2021-01-07 NOTE — Telephone Encounter (Signed)
I have replied to patient and ordered atrovent nasal spray. If patient continues to have persistent symptoms or if she is replying that she has wheezing, then she will need to have an appointment scheduled for evaluation.  Mechele Collin, M.D. [facility] Pulmonary/Critical Care Medicine 01/07/2021 3:35 PM

## 2021-01-07 NOTE — Telephone Encounter (Signed)
Noted  

## 2021-01-07 NOTE — Telephone Encounter (Signed)
Dr. Ellison, please see mychart messages sent by pt and advise. ?

## 2021-01-08 ENCOUNTER — Encounter: Payer: Self-pay | Admitting: Pulmonary Disease

## 2021-01-08 ENCOUNTER — Ambulatory Visit (INDEPENDENT_AMBULATORY_CARE_PROVIDER_SITE_OTHER): Payer: BC Managed Care – PPO | Admitting: Pulmonary Disease

## 2021-01-08 ENCOUNTER — Other Ambulatory Visit: Payer: Self-pay

## 2021-01-08 VITALS — BP 128/80 | HR 110 | Temp 98.0°F | Ht 62.0 in | Wt 230.0 lb

## 2021-01-08 DIAGNOSIS — I10 Essential (primary) hypertension: Secondary | ICD-10-CM

## 2021-01-08 DIAGNOSIS — J4550 Severe persistent asthma, uncomplicated: Secondary | ICD-10-CM | POA: Diagnosis not present

## 2021-01-08 DIAGNOSIS — J383 Other diseases of vocal cords: Secondary | ICD-10-CM | POA: Diagnosis not present

## 2021-01-08 DIAGNOSIS — G4733 Obstructive sleep apnea (adult) (pediatric): Secondary | ICD-10-CM

## 2021-01-08 DIAGNOSIS — R053 Chronic cough: Secondary | ICD-10-CM

## 2021-01-08 DIAGNOSIS — U099 Post covid-19 condition, unspecified: Secondary | ICD-10-CM

## 2021-01-08 MED ORDER — PSEUDOEPHEDRINE HCL 60 MG PO TABS: 60.0000 mg | ORAL_TABLET | Freq: Four times a day (QID) | ORAL | 0 refills | Status: AC | PRN

## 2021-01-08 MED ORDER — EPINEPHRINE 0.3 MG/0.3ML IJ SOAJ: 0.3000 mg | Freq: Once | INTRAMUSCULAR | 2 refills | Status: AC

## 2021-01-08 NOTE — Patient Instructions (Signed)
Covid long hauler manifested as chronic cough, vocal cord dysfunction - uncontrolled, triggered by postnasal drip due to environmental exposures with pollen Severe eosinophilia persistent asthma  --CONTINUE Breztri 160-9-4.5 mcg TWO puffs TWICE daily.  --CONTINUE Albuterol AS NEEDED for shortness of breath or wheezing --Check in progress for Fasenra enrollment --Continue therapy with Speech and ENT --Continue gabapentin  Allergic Rhinitis --CONTINUE Atrovent nasal spray daily --CONTINUE Flonase nightly --CONTINUE cetirizine 10 mg daily --START Pseudoephedrine 60 mg every 6 hours as needed or Extended release 120 mg twice a day for five days for symptom relief --Continue saline rinses, nettie pot --Minimize outdoor exposure as much as possible  OSA --ORDER auto-CPAP

## 2021-01-08 NOTE — Progress Notes (Addendum)
@Patient  ID: , female    DOB: [birth year], 55 y.o.   MRN: [id number]  Chief Complaint  Patient presents with  . Follow-up    Sinus congestion   HPI:  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   She is using albuterol inhaler every 4 hours and nebulizer every 8 hours. She has been compliant with her Breztri. She has noticed her wheezing has worsened in the last 48 hours. She has had sinus congestion, post nasal drainage and headaches. Denies fevers and chills. Associated with itchy watery eyes. She just started her atrovent nasal spray this morning. Currently on mucinex max with minimal improvement. She was seen by ENT last week. Continues to follow with Speech  Questionaires / Pulmonary Flowsheets:   ACT:  Asthma Control Test ACT Total Score  10/25/2020 6  07/27/2020 9  07/13/2020 8   Tests:   Imaging: CTA 10/11/19 - No pulmonary embolism. Normal parenchyma, no effusion or edema HRCT at [facility] [place] 03/30/2020 (report only) - No evidence of interstitial lung disease. Mild diffuse central airway thickening which can be seen with asthma or bronchitis  PFTs: 01/06/2020 FVC 1.27 [39%], FEV1 1.06 [41%], F/F 84 TLC 3.97 [83%], DLCO 20.20 [104%] Interpretation: Normal spirometry however significant bronchodilator response with post-bronchodilator FEV1 increased by 25% and increased small airway disease response. No evidence of restrictive lung disease. Normal gas exchange  Labs: IgE 01/06/2020- 18 CBC 10/11/2019-WBC 8.7, eos 2%, absolute eosinophil count 200  09/15/2019-SARS-CoV-2-positive  05/30/2020-home sleep study-AHI 8.5, SaO2 low 75%   Specialty Problems      Pulmonary Problems   Vocal cord dysfunction    Patient evaluated in March/2021 at [facility] with Dr. Manville Rico [facility].  Findings were positive for paradoxical vocal cord motion  during relaxed breathing felt to be contributing to her shortness of breath Referred to speech pathology, now referred to [facility]      OSA (obstructive sleep apnea)   Severe persistent asthma without complication      Allergies  Allergen Reactions  . Other Hives  . Sulfa Antibiotics Hives  . Bee Venom Hives    Past Medical History:  Diagnosis Date  . Asthma   . DVT (deep venous thrombosis) (HCC)   . High cholesterol   . Hypertension   . Pneumonia     Outpatient Encounter Medications as of 01/08/2021  Medication Sig  . albuterol (PROVENTIL) (2.5 MG/3ML) 0.083% nebulizer solution Take 3 mLs (2.5 mg total) by nebulization every 6 (six) hours as needed for wheezing or shortness of breath.  01/10/2021 albuterol (VENTOLIN HFA) 108 (90 Base) MCG/ACT inhaler INHALE 1 TO 2 PUFFS BY MOUTH EVERY 4 TO 6 HOURS  . Budeson-Glycopyrrol-Formoterol (BREZTRI AEROSPHERE) 160-9-4.8 MCG/ACT AERO Inhale 2 puffs into the lungs in the morning and at bedtime.  . cetirizine (ZYRTEC) 10 MG tablet Take 1 tablet by mouth once daily  . famotidine (PEPCID) 40 MG tablet Take 1 tablet (40 mg total) by mouth at bedtime.  . fluticasone (FLONASE) 50 MCG/ACT nasal spray Place 1 spray into both nostrils daily.  Marland Kitchen gabapentin (NEURONTIN) 100 MG capsule Take 100 mg by mouth 3 (three) times daily.  Marland Kitchen ipratropium (ATROVENT) 0.06 % nasal spray Place 2 sprays into both nostrils in the morning and at bedtime.  Marland Kitchen losartan-hydrochlorothiazide (HYZAAR) 50-12.5 MG tablet Take 1 tablet by mouth daily.  Marland Kitchen  omeprazole (PRILOSEC) 40 MG capsule Take 1 capsule by mouth once daily  . ondansetron (ZOFRAN-ODT) 8 MG disintegrating tablet Take 8 mg by mouth every 6 (six) hours as needed.  . predniSONE (DELTASONE) 10 MG tablet 4 tabs for 2 days, then 3 tabs for 2 days, 2 tabs for 2 days, then 1 tab for 2 days, then stop (Patient not taking: Reported on 01/08/2021)   No facility-administered encounter medications on file as of  01/08/2021.     Review of Systems Review of Systems  Constitutional: Negative for chills, diaphoresis, fever, malaise/fatigue and weight loss.  HENT: Positive for congestion and sinus pain.   Respiratory: Positive for cough, shortness of breath and wheezing. Negative for hemoptysis and sputum production.   Cardiovascular: Negative for chest pain, palpitations and leg swelling.      Physical Exam  BP 128/80 (BP Location: Left Arm, Cuff Size: Normal)   Pulse (!) 110   Temp 98 F (36.7 C)   Ht 5\' 2"  (1.575 m)   Wt 230 lb (104.3 kg)   SpO2 98%   BMI 42.07 kg/m   General: Well-appearing, no acute distress HENT: [place], AT, OP clear, MMM Eyes: EOMI, no scleral icterus Respiratory: Upper airway wheezing with lungs clear to auscultation bilaterally.  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  Asthma remains uncontrolled despite triple therapy. Planning to enroll in [place].  Covid long hauler manifested as chronic cough, vocal cord dysfunction - uncontrolled, triggered by postnasal drip due to environmental exposures with pollen Severe eosinophilia persistent asthma  --CONTINUE Breztri 160-9-4.5 mcg TWO puffs TWICE daily.  --CONTINUE Albuterol AS NEEDED for shortness of breath or wheezing --Check in progress for Fasenra enrollment. ADDENDUM: Patient approved. Epi pen ordered and will arrange initial injection with Pharmacy team --Continue therapy with Speech and ENT --Continue gabapentin  Allergic Rhinitis --CONTINUE Atrovent nasal spray daily --CONTINUE Flonase nightly --CONTINUE cetirizine 10 mg  daily --START Pseudoephedrine 60 mg every 6 hours as needed or Extended release 120 mg twice a day for five days for symptom relief --Continue saline rinses, nettie pot --Minimize outdoor exposure as much as possible  OSA --ORDER auto-CPAP  Hypertension --Continue home losartan-HCTZ per PCP   Health Maintenance Immunization History  Administered Date(s) Administered  . Influenza Whole 07/04/2019, 07/26/2020   Orders Placed This Encounter  Procedures  . Ambulatory Referral for DME    Referral Priority:   Routine    Referral Type:   Durable Medical Equipment Purchase    Number of Visits Requested:   1   Meds ordered this encounter  Medications  . pseudoephedrine (SUDAFED) 60 MG tablet    Sig: Take 1 tablet (60 mg total) by mouth every 6 (six) hours as needed for congestion.    Dispense:  30 tablet    Refill:  0  . EPINEPHrine 0.3 mg/0.3 mL IJ SOAJ injection    Sig: Inject 0.3 mg into the muscle once for 1 dose.    Dispense:  1 each    Refill:  2   No follow-ups on file.  I have spent a total time of 38-minutes on the day of the appointment reviewing prior documentation,  coordinating care and discussing medical diagnosis and plan with the patient/family. Imaging, labs and tests included in this note have been reviewed and interpreted independently by me.  Mechele Collin, M.D. [facility] Pulmonary/Critical Care Medicine 01/08/2021 2:30 PM

## 2021-01-08 NOTE — Telephone Encounter (Signed)
Pt advised to call office and schedule OV because she now states she is wheezing.

## 2021-01-08 NOTE — Telephone Encounter (Signed)
Faxed patient's Fasenra paperwork to [employer]. To get patient copay card info.

## 2021-01-09 ENCOUNTER — Telehealth: Payer: Self-pay | Admitting: Pulmonary Disease

## 2021-01-09 ENCOUNTER — Other Ambulatory Visit (HOSPITAL_COMMUNITY): Payer: Self-pay

## 2021-01-09 ENCOUNTER — Encounter: Payer: Self-pay | Admitting: Pulmonary Disease

## 2021-01-09 DIAGNOSIS — I10 Essential (primary) hypertension: Secondary | ICD-10-CM

## 2021-01-09 NOTE — Telephone Encounter (Addendum)
Spoke with patient about Harrington Challenger and apologized for delay in starting [place]. Since Harrington Challenger is approved through medical benefit, she is amenable to coming to clinic to start until she is approved. She is in the process of a lawsuit and will have to apply for disability after lawsuit resolves. She does not have a clear idea of when this will be.  Epipen rx was sent to pharmacy yesterday along with pseudoephedrine, however patient has not yet picked up. She will plan to do so this week  Will place therapy plan to get referral and to ensure auth through medical benefit till in place. Patient aware of Epipen requirement and 2 hour monitoring time for first injection.  Chesley Mires, PharmD, MPH Clinical Pharmacist (Rheumatology and Pulmonology)

## 2021-01-09 NOTE — Telephone Encounter (Deleted)
Current approval through [facility] is through medical benefit.  Received fax from [employer] that patient's BIV is in progress.

## 2021-01-09 NOTE — Telephone Encounter (Signed)
Patient has documented hypertension.

## 2021-01-09 NOTE — Telephone Encounter (Signed)
Spoke with Vikki Ports at [employer]. She states patient needed to have another diagnosis to qualify her for the CPAP machine.  Diagnosis of: excessive daytime sleep                          Impaired cognition                          Mood disorder                          Insomnia                           Hypertension                          Ischemic heart disease                          History of stroke  I do not see any of theses diagnosis on patient's chart.   Dr. Everardo All please advise  Thanks

## 2021-01-09 NOTE — Addendum Note (Signed)
Addended by: Murrell Redden on: 01/09/2021 02:08 PM   Modules accepted: Orders

## 2021-01-09 NOTE — Telephone Encounter (Signed)
PA request was received from (pharmacy): [facility] in [place] Phone: [phone] Fax: [phone] Medication name and strength: Atrovent 0.06% nasal spray Ordering Provider: JE  Was PA started with CMM?: Yes If yes, please enter KEY: [id number] Medication tried and failed:  Covered Alternatives:

## 2021-01-10 NOTE — Telephone Encounter (Signed)
Called and spoke with Burna Mortimer at [facility] to let her know that Dr. Everardo All said patient has documented diagnosis of Hypertension. She said she needs documentation of that because she does not have it. She provided fax number of [phone]. OV notes have been faxed to her. Nothing further needed at this time.

## 2021-01-11 NOTE — Telephone Encounter (Signed)
Called to verify if patient has Epipen on hand for Harrington Challenger new start which is scheduled for 01/16/21.  She states she's in the process of having is cleared by workman's comp for payment. She states she will cancel her appointment if she does not have Epipen by that appointment.

## 2021-01-14 NOTE — Telephone Encounter (Signed)
Received the following message from patient:   "When you and I talked you said this job had to be very sedentary and they are requesting me to walk  from office to office to do the job is that in the job description that you approved I need a clarification what you think sedentarymeans"   I sent a message back to the patient asking if the job description has changed as the document JE signed back in March did not mention anything about her having to walk from office to office. She stated that the job description had not changed and that you had signed off on it. I don't see any thing with this documentation in her chart.   She is not requesting to be completely written out of work.   JE, can you please advise? Thanks!

## 2021-01-15 ENCOUNTER — Other Ambulatory Visit (HOSPITAL_COMMUNITY): Payer: Self-pay

## 2021-01-15 NOTE — Telephone Encounter (Addendum)
Fasenra Copay card detailsJohnney Bennett- [id number] [id number] ID- [id number] Group- [id number]  Fasenra copay debit card info-  [id number]  Exp- 01/19/26 Cvv- 953 Patient's zip code  Claim form and EOB must be sent to Fax# [phone] for funds to be loaded on to card. Program will process claims up to 29 days old.

## 2021-01-15 NOTE — Telephone Encounter (Signed)
Received fax from [facility] on Pharmacy auth that no new Berkley Harvey is on file. Patient must fill through [facility] for pharmacy benefit.  Called plan to get info for previous authorization. Rep states no place of service was filled out for medical benefit authorization, so place of service was processed as the in- network Specialty Pharmacy- [facility]. Rep confirmed loading doses are still valid for 10/2020 authorization.  Submitted new auth for Infusion center for buy and bill. Awaiting response.  Messaged infusion team to use sample for 4/27 injection.

## 2021-01-15 NOTE — Telephone Encounter (Signed)
Dr. Everardo All, received the message back from the patient.  Doctor Everardo All yesterday they had me in one office to put papers in alphabetical order then go over and stand at a file cabinet and see if they were in one file cabinet if they wasnt in  that one check a file cabinet that was beside of that one and if there wasn't a file with that person's name there then to make a chart and to put the chart in the file cabinet and it was standing and repetitive walking back and forth to the file cabinet to get this done in addition to the walking I did over a mile while I was in the facility yesterday. By lunch time at 145 my face was red and hot to touch and very short of breath. It took a while to catch my breath enough so I could leave since I had done all the work that they had for me that day.   Please advise for any recs. Thanks!

## 2021-01-16 ENCOUNTER — Other Ambulatory Visit: Payer: Self-pay

## 2021-01-16 ENCOUNTER — Ambulatory Visit (INDEPENDENT_AMBULATORY_CARE_PROVIDER_SITE_OTHER): Payer: BC Managed Care – PPO

## 2021-01-16 VITALS — BP 118/78 | HR 75 | Temp 98.1°F | Resp 20

## 2021-01-16 DIAGNOSIS — Z79899 Other long term (current) drug therapy: Secondary | ICD-10-CM

## 2021-01-16 DIAGNOSIS — J4550 Severe persistent asthma, uncomplicated: Secondary | ICD-10-CM | POA: Diagnosis not present

## 2021-01-16 MED ORDER — BENRALIZUMAB 30 MG/ML ~~LOC~~ SOSY
30.0000 mg | PREFILLED_SYRINGE | Freq: Once | SUBCUTANEOUS | Status: AC
  Administered 2021-01-16: 30 mg via SUBCUTANEOUS

## 2021-01-16 NOTE — Progress Notes (Signed)
Diagnosis: Asthma  Provider:  Chilton Greathouse, MD  Procedure: Injection  Fasenra (Benralizumab), Dose: 30 mg, Site: subcutaneous. 30 minutes observation post injection with no side effects.  Discharge: Condition: Good, Destination: Home . AVS provided to patient.   Performed by:  Garnette Czech, RN

## 2021-01-16 NOTE — Telephone Encounter (Signed)
I have called patient. I have referred to the letter written on 12/10/20 for work release for a sedentary position. If she needs any additional documentation or has any further medical questions, she can contact the office.  Mechele Collin, M.D. [facility] Pulmonary/Critical Care Medicine 01/16/2021 12:00 PM

## 2021-01-25 ENCOUNTER — Ambulatory Visit (INDEPENDENT_AMBULATORY_CARE_PROVIDER_SITE_OTHER): Payer: BC Managed Care – PPO | Admitting: Pulmonary Disease

## 2021-01-25 ENCOUNTER — Other Ambulatory Visit: Payer: Self-pay

## 2021-01-25 ENCOUNTER — Encounter: Payer: Self-pay | Admitting: Pulmonary Disease

## 2021-01-25 VITALS — BP 116/78 | HR 64 | Temp 98.0°F | Ht 62.0 in | Wt 230.2 lb

## 2021-01-25 DIAGNOSIS — U099 Post covid-19 condition, unspecified: Secondary | ICD-10-CM

## 2021-01-25 DIAGNOSIS — J383 Other diseases of vocal cords: Secondary | ICD-10-CM

## 2021-01-25 DIAGNOSIS — G4733 Obstructive sleep apnea (adult) (pediatric): Secondary | ICD-10-CM | POA: Diagnosis not present

## 2021-01-25 DIAGNOSIS — R053 Chronic cough: Secondary | ICD-10-CM | POA: Insufficient documentation

## 2021-01-25 DIAGNOSIS — J4550 Severe persistent asthma, uncomplicated: Secondary | ICD-10-CM | POA: Diagnosis not present

## 2021-01-25 NOTE — Progress Notes (Signed)
@Patient  ID: , female    DOB: 08-04-1966, 55 y.o.   MRN: [id number]  Chief Complaint  Patient presents with  . Follow-up    Requesting clarification on how she obtained sleep apnea   HPI:  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   Since our last visit, she uses albuterol 4-5 times a day. She is compliant with her 57 and uses her nebulizer nightly. Her wheezing is chronic but has improved compared to then. Congestion has improved with OTC and steam. Has been following with Speech which she feels has been effective. Wheezing, cough and shortness of breath worsen with activity and allergen exposure.  Questionaires / Pulmonary Flowsheets:   ACT:  Asthma Control Test ACT Total Score  10/25/2020 6  07/27/2020 9  07/13/2020 8   Tests:   Imaging: CTA 10/11/19 - No pulmonary embolism. Normal parenchyma, no effusion or edema HRCT at [facility] 03/30/2020 (report only) - No evidence of interstitial lung disease. Mild diffuse central airway thickening which can be seen with asthma or bronchitis  PFTs: 01/06/2020 FVC 1.27 [39%], FEV1 1.06 [41%], F/F 84 TLC 3.97 [83%], DLCO 20.20 [104%] Interpretation: Normal spirometry however significant bronchodilator response with post-bronchodilator FEV1 increased by 25% and increased small airway disease response. No evidence of restrictive lung disease. Normal gas exchange  Labs: IgE 01/06/2020- 18 CBC 10/11/2019-WBC 8.7, eos 2%, absolute eosinophil count 200  09/15/2019-SARS-CoV-2-positive  05/30/2020-home sleep study-AHI 8.5, SaO2 low 75%   Specialty Problems      Pulmonary Problems   Vocal cord dysfunction    Patient evaluated in March/2021 at [facility].  Findings were positive for paradoxical vocal cord motion during relaxed breathing felt to be contributing to her  shortness of breath Referred to speech pathology, now referred to [facility]      OSA (obstructive sleep apnea)   Severe persistent asthma without complication      Allergies  Allergen Reactions  . Other Hives  . Sulfa Antibiotics Hives  . Bee Venom Hives    Past Medical History:  Diagnosis Date  . Asthma   . DVT (deep venous thrombosis) (HCC)   . High cholesterol   . Hypertension   . Pneumonia     Outpatient Encounter Medications as of 01/25/2021  Medication Sig  . albuterol (PROVENTIL) (2.5 MG/3ML) 0.083% nebulizer solution Take 3 mLs (2.5 mg total) by nebulization every 6 (six) hours as needed for wheezing or shortness of breath.  03/27/2021 albuterol (VENTOLIN HFA) 108 (90 Base) MCG/ACT inhaler INHALE 1 TO 2 PUFFS BY MOUTH EVERY 4 TO 6 HOURS  . Budeson-Glycopyrrol-Formoterol (BREZTRI AEROSPHERE) 160-9-4.8 MCG/ACT AERO Inhale 2 puffs into the lungs in the morning and at bedtime.  . cetirizine (ZYRTEC) 10 MG tablet Take 1 tablet by mouth once daily  . famotidine (PEPCID) 40 MG tablet Take 1 tablet (40 mg total) by mouth at bedtime.  . fluticasone (FLONASE) 50 MCG/ACT nasal spray Place 1 spray into both nostrils daily.  Marland Kitchen gabapentin (NEURONTIN) 100 MG capsule Take 100 mg by mouth 3 (three) times daily.  Marland Kitchen ipratropium (ATROVENT) 0.06 % nasal spray Place 2 sprays into both nostrils in the morning and at bedtime.  Marland Kitchen losartan-hydrochlorothiazide (HYZAAR) 50-12.5 MG tablet Take 1 tablet by mouth daily.  Marland Kitchen omeprazole (PRILOSEC) 40 MG capsule Take 1 capsule by  mouth once daily  . ondansetron (ZOFRAN-ODT) 8 MG disintegrating tablet Take 8 mg by mouth every 6 (six) hours as needed.  . predniSONE (DELTASONE) 10 MG tablet 4 tabs for 2 days, then 3 tabs for 2 days, 2 tabs for 2 days, then 1 tab for 2 days, then stop (Patient not taking: Reported on 01/08/2021)  . pseudoephedrine (SUDAFED) 60 MG tablet Take 1 tablet (60 mg total) by mouth every 6 (six) hours as needed for congestion.    No facility-administered encounter medications on file as of 01/25/2021.     Review of Systems Review of Systems  Constitutional: Negative for chills, diaphoresis, fever, malaise/fatigue and weight loss.  HENT: Negative for congestion.   Respiratory: Positive for cough, shortness of breath and wheezing. Negative for hemoptysis and sputum production.   Cardiovascular: Negative for chest pain, palpitations and leg swelling.       BP 116/78 (BP Location: Left Arm, Cuff Size: Normal)   Pulse 64   Temp 98 F (36.7 C) (Temporal)   Ht 5\' 2"  (1.575 m)   Wt 230 lb 3.2 oz (104.4 kg)   SpO2 99% Comment: RA  BMI 42.10 kg/m   Physical Exam: General: Well-appearing, no acute distress HENT: [place], AT, hoarse Eyes: EOMI, no scleral icterus Respiratory: Upper airway wheeze. Clear to auscultation bilaterally.  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 Case worker present  Covid long hauler manifested as chronic cough, vocal cord dysfunction - uncontrolled, triggered by postnasal drip due to environmental exposures with pollen Severe eosinophilia persistent asthma  --CONTINUE Fasenra as scheduled --CONTINUE Breztri 160-9-4.5 mcg TWO puffs TWICE daily.  --CONTINUE Albuterol AS NEEDED for shortness of breath or wheezing --CONTINUE Speech. Last session  --CONTINUE ENT follow-up --Continue gabapentin  Allergic Rhinitis --CONTINUE Atrovent nasal spray daily --CONTINUE Flonase nightly --CONTINUE cetirizine 10 mg daily --CONTINUE Pseudoephedrine 60 mg every 6 hours as needed. Caution in setting of hypertension  --Continue saline rinses, nettie  pot --Minimize outdoor exposure as much as possible  OSA --Discussed etiology of her condition. Unable to link to Covid. Discussed treatment with CPAP, weight loss and oral appliance --CPAP supplies ordered  Hypertension --Continue home losartan-HCTZ per PCP    Health Maintenance Immunization History  Administered Date(s) Administered  . Influenza Whole 07/04/2019, 07/26/2020   Orders Placed This Encounter  Procedures  . Ambulatory Referral for DME    Referral Priority:   Routine    Referral Type:   Durable Medical Equipment Purchase    Number of Visits Requested:   1   No orders of the defined types were placed in this encounter.  Return in about 3 months (around 04/27/2021).  I have spent a total time of 36-minutes on the day of the appointment reviewing prior documentation, coordinating care and discussing medical diagnosis and plan with the patient/family. Imaging, labs and tests included in this note have been reviewed and interpreted independently by me.  06/27/2021, M.D. [facility] Pulmonary/Critical Care Medicine 01/25/2021 11:48 AM

## 2021-01-25 NOTE — Patient Instructions (Signed)
Covid long hauler manifested as chronic cough, vocal cord dysfunction - uncontrolled but slightly improved, triggered by postnasal drip due to environmental exposures with pollen Severe eosinophilia persistent asthma  --CONTINUE Fasenra as scheduled --CONTINUE Breztri 160-9-4.5 mcg TWO puffs TWICE daily.  --CONTINUE Albuterol AS NEEDED for shortness of breath or wheezing --CONTINUE Speech. Last session  --CONTINUE ENT follow-up --Continue gabapentin  Allergic Rhinitis --CONTINUE Atrovent nasal spray daily --CONTINUE Flonase nightly --CONTINUE cetirizine 10 mg daily --CONTINUE Pseudoephedrine 60 mg every 6 hours as needed. Caution in setting of hypertension  --Continue saline rinses, nettie pot --Minimize outdoor exposure as much as possible  OSA --CPAP supplies ordered  Follow-up with me in 3 months or sooner as needed

## 2021-01-28 MED ORDER — FASENRA PEN 30 MG/ML ~~LOC~~ SOAJ: SUBCUTANEOUS | 0 refills | Status: DC

## 2021-01-28 NOTE — Addendum Note (Signed)
Addended by: Murrell Redden on: 01/28/2021 12:40 PM   Modules accepted: Orders

## 2021-01-28 NOTE — Telephone Encounter (Signed)
Rx for Fasenra auto-injector sent to [facility] to be delivered to infusion center on [address]. Routing to [employer] to help coordinate shipment

## 2021-01-28 NOTE — Telephone Encounter (Signed)
Called [employer], they cancelled previous auth as a duplicate. Since patient is considering self-injecting we will use pharmacy benefit auth.  Can you please send Jane Bennett rx to [facility]?

## 2021-01-29 ENCOUNTER — Telehealth: Payer: Self-pay | Admitting: *Deleted

## 2021-01-29 NOTE — Telephone Encounter (Signed)
I called the pharmacy and they stated that the pt just picked up the [facility] on 01/21/2021 without any issues.  The medication is being covered by her workmans comp.  Will forward to JE to make her aware. Nothing further is needed.

## 2021-01-29 NOTE — Telephone Encounter (Signed)
Tried to do the PA for the [facility] and this is the message that I got:   Additional Information Required This medication may be excluded from the patient's benefit. For more information, please reach out to [facility] directly at [phone]. Message from [employer]: Drug is not covered by plan   JE please advise. Thanks

## 2021-01-29 NOTE — Telephone Encounter (Signed)
Patient had failed Wixela and Spiriva. Markus Daft has demonstrated >50% improvement in her wheezing. Please obtain prior auth for inhaler.

## 2021-02-04 NOTE — Telephone Encounter (Signed)
Dr. Everardo All, Patient is requesting a note excusing her from wearing a surgical mask at work. Please advise, thanks.

## 2021-02-07 NOTE — Telephone Encounter (Signed)
F/u.  Called [facility] twice.  Was told on 5/12 still pending and they will reach out to patient to get consent to mail medication to [facility]. On 5/19 called to schedule deliver and was told still pending. Patient still have not been contacted by [facility].  I will attempt to call patient to get consent.  Requested for [facility] to expedite d/t patient have upcoming appt. Ref# [id number] rep- Joe L.

## 2021-02-07 NOTE — Telephone Encounter (Signed)
Jane Bennett has been scheduled for delivery on 02/12/21 to [facility]. Ref# [name]

## 2021-02-13 ENCOUNTER — Telehealth: Payer: Self-pay | Admitting: Pharmacy Technician

## 2021-02-13 ENCOUNTER — Ambulatory Visit (INDEPENDENT_AMBULATORY_CARE_PROVIDER_SITE_OTHER): Payer: BC Managed Care – PPO

## 2021-02-13 ENCOUNTER — Other Ambulatory Visit: Payer: Self-pay

## 2021-02-13 VITALS — BP 116/79 | HR 72 | Temp 97.7°F | Resp 18

## 2021-02-13 DIAGNOSIS — J4550 Severe persistent asthma, uncomplicated: Secondary | ICD-10-CM | POA: Diagnosis not present

## 2021-02-13 DIAGNOSIS — Z79899 Other long term (current) drug therapy: Secondary | ICD-10-CM

## 2021-02-13 MED ORDER — BENRALIZUMAB 30 MG/ML ~~LOC~~ SOSY
30.0000 mg | PREFILLED_SYRINGE | Freq: Once | SUBCUTANEOUS | Status: AC
  Administered 2021-02-13: 30 mg via SUBCUTANEOUS

## 2021-02-13 NOTE — Telephone Encounter (Signed)
Patient has transitioned to self-inject.  Patient felt comfortable and had no questions for Rn staff.  Forwarding message to [facility] for new script  to be sent to [facility] [phone]) for PENS.

## 2021-02-13 NOTE — Progress Notes (Signed)
Diagnosis: Asthma  Provider:  Chilton Greathouse, MD  Procedure: Injection  Fasenra (Benralizumab), Dose: 30 mg, Site: subcutaneous L arm  Discharge: Condition: Good, Destination: Home . AVS provided to patient.   Instructions given to patient regarding pen administration.  Teaching completed using return demonstration.   Performed by:  Marin Shutter, RN

## 2021-02-14 ENCOUNTER — Other Ambulatory Visit (HOSPITAL_COMMUNITY): Payer: Self-pay

## 2021-02-14 ENCOUNTER — Telehealth: Payer: Self-pay | Admitting: Pulmonary Disease

## 2021-02-14 ENCOUNTER — Encounter: Payer: Self-pay | Admitting: Pulmonary Disease

## 2021-02-14 NOTE — Telephone Encounter (Signed)
The pharmacy team manages prior auths for [place]. Routing to pharmacy team for follow-up.

## 2021-02-14 NOTE — Telephone Encounter (Signed)
Called [employer], patient's loading doses expired on 02/03/21 since her PA was originally approved in 10/2020. Rep put in request to have them back to current auth on file.   She said to have pharmacy try to process tomorrow.  [employer]# [phone]

## 2021-02-15 NOTE — Telephone Encounter (Signed)
Addressed in previous encounter. PA is in process of being updated. Loading doses had expired on auth.

## 2021-02-19 NOTE — Telephone Encounter (Signed)
Per Selena Batten with the infusion team, refill was processed and scheduled to deliver to the clinic on 02/20/21.

## 2021-02-21 ENCOUNTER — Telehealth: Payer: Self-pay | Admitting: Pulmonary Disease

## 2021-02-21 DIAGNOSIS — J4550 Severe persistent asthma, uncomplicated: Secondary | ICD-10-CM

## 2021-02-21 NOTE — Telephone Encounter (Addendum)
Rx for Fasenra sent to [facility] for Week 8 dose and every 8 weeks  First dose: 01/16/21 Second dose: 02/13/21 Third dose will be shipped to patient's home. Rx sent to [facility] today - due 03/13/21  Will route to infusion team to cancel future infusion appointments.  Will f/u with [facility] on Monday, 02/25/21 to provide consent to ship to patient's home and advise patient  Chesley Mires, PharmD, MPH Clinical Pharmacist (Rheumatology and Pulmonology)

## 2021-02-22 MED ORDER — FASENRA PEN 30 MG/ML ~~LOC~~ SOAJ: SUBCUTANEOUS | 1 refills | Status: DC

## 2021-02-22 NOTE — Telephone Encounter (Signed)
Rx for Fasenra sent to [facility] - for Week 8 and every 8 weeks dose  First dose: 01/16/21 Second dose: 02/13/21 Third dose will be shipped to patient's home. Rx sent to [facility] today - due 03/13/21  Will route to infusion team to cancel future infusion appointments.  Will f/u with [facility] on Monday, 02/25/21 to provide consent to ship to patient's home and advise patient  Chesley Mires, PharmD, MPH Clinical Pharmacist (Rheumatology and Pulmonology)

## 2021-02-22 NOTE — Telephone Encounter (Signed)
Left VM with patient to advise and provided [facility] pharmacy phone number on VM. Patient will need f/u with Dr. Everardo All in 1-2 months since starting Mickie Hillier, PharmD, MPH Clinical Pharmacist (Rheumatology and Pulmonology)

## 2021-02-25 NOTE — Telephone Encounter (Signed)
Called [facility] to provide consent to schedule future Fasenra shipments to patient's home. Called patient to advise that she should call pharmacy on or after 03/09/21 so that pharmacy can adjudicate claim successfully and she can receive shipment by the day her dose is due on 03/19/21  Chesley Mires, PharmD, MPH Clinical Pharmacist (Rheumatology and Pulmonology)

## 2021-03-09 ENCOUNTER — Encounter: Payer: Self-pay | Admitting: Pulmonary Disease

## 2021-03-11 ENCOUNTER — Telehealth: Payer: Self-pay | Admitting: Pharmacist

## 2021-03-11 NOTE — Telephone Encounter (Addendum)
Patient called stating that [facility] is unable to fill Fasenra until next week. I reached out to [facility] and the first rep stated Harrington Challenger could be processed 03/12/21. When I called the pharmacy a second time, I was told the earliest Harrington Challenger could be filled was 03/18/21.   Apologized to patient for confusion. We will set aside a Fasenra dose in the infusion center pyxis for her to pick up for dose that is due this Wednesday, 03/13/21. She will plan to stop by in the morning.  When patient arrives to pick up dose, please notify Desma Mcgregor, RPh, or Bayard Hugger, DNP, with infusion center to assist with getting medication from [facility], PharmD, MPH Clinical Pharmacist (Rheumatology and Pulmonology)

## 2021-03-13 ENCOUNTER — Ambulatory Visit: Payer: Self-pay

## 2021-03-13 NOTE — Telephone Encounter (Signed)
Patient pick up dose around 0800 03/13/21

## 2021-03-21 ENCOUNTER — Other Ambulatory Visit: Payer: Self-pay | Admitting: *Deleted

## 2021-04-04 ENCOUNTER — Telehealth: Payer: Self-pay | Admitting: *Deleted

## 2021-04-04 NOTE — Telephone Encounter (Signed)
PA request was received from (pharmacy): [facility] Phone:[phone] Fax: [phone] Medication name and strength: Cetirizine 10mg  Ordering Provider:  Was PA started with [facility]?: yes If yes, please enter KEY: [id number] Medication tried and failed: Taking generic Covered Alternatives: unknown  PA sent to plan, time frame for approval / denial: unknown Routing to triage for follow-up

## 2021-04-04 NOTE — Telephone Encounter (Signed)
PA request was received from (pharmacy): [facility] Phone:[phone] Fax: [phone] Medication name and strength: Omeprazole 40mg  DR capsules Ordering Provider:  Was PA started with [facility]?: yes If yes, please enter KEY: [id number] Medication tried and failed: taking generic Covered Alternatives: unknown  PA sent to plan, time frame for approval / denial: unknown Routing to triage for follow-up

## 2021-04-09 NOTE — Telephone Encounter (Signed)
Medication name and strength: Cetirizine PA approved/denied: denied If denied, reason for denial: Health benefit plan does not cover therapeutically equivalent to an over-the-counter drug.  [facility] and patient informed.

## 2021-04-16 ENCOUNTER — Other Ambulatory Visit (HOSPITAL_COMMUNITY): Payer: Self-pay

## 2021-05-02 ENCOUNTER — Ambulatory Visit: Payer: Self-pay | Admitting: Pulmonary Disease

## 2021-05-08 ENCOUNTER — Ambulatory Visit: Payer: Self-pay

## 2021-05-12 ENCOUNTER — Other Ambulatory Visit: Payer: Self-pay | Admitting: Pulmonary Disease

## 2021-05-12 DIAGNOSIS — J302 Other seasonal allergic rhinitis: Secondary | ICD-10-CM

## 2021-05-12 DIAGNOSIS — K219 Gastro-esophageal reflux disease without esophagitis: Secondary | ICD-10-CM

## 2021-05-12 DIAGNOSIS — J383 Other diseases of vocal cords: Secondary | ICD-10-CM

## 2021-05-13 ENCOUNTER — Encounter: Payer: Self-pay | Admitting: Pulmonary Disease

## 2021-05-13 ENCOUNTER — Ambulatory Visit (INDEPENDENT_AMBULATORY_CARE_PROVIDER_SITE_OTHER): Payer: BC Managed Care – PPO | Admitting: Pulmonary Disease

## 2021-05-13 ENCOUNTER — Other Ambulatory Visit: Payer: Self-pay

## 2021-05-13 VITALS — BP 163/80 | HR 80 | Temp 98.3°F | Ht 62.0 in | Wt 234.8 lb

## 2021-05-13 DIAGNOSIS — G4733 Obstructive sleep apnea (adult) (pediatric): Secondary | ICD-10-CM | POA: Diagnosis not present

## 2021-05-13 DIAGNOSIS — J4550 Severe persistent asthma, uncomplicated: Secondary | ICD-10-CM | POA: Diagnosis not present

## 2021-05-13 DIAGNOSIS — J383 Other diseases of vocal cords: Secondary | ICD-10-CM

## 2021-05-13 MED ORDER — BREZTRI AEROSPHERE 160-9-4.8 MCG/ACT IN AERO: 2.0000 | INHALATION_SPRAY | Freq: Two times a day (BID) | RESPIRATORY_TRACT | 6 refills | Status: DC

## 2021-05-13 MED ORDER — IPRATROPIUM BROMIDE 0.06 % NA SOLN: 2.0000 | Freq: Two times a day (BID) | NASAL | 12 refills | Status: DC

## 2021-05-13 MED ORDER — ALBUTEROL SULFATE HFA 108 (90 BASE) MCG/ACT IN AERS: INHALATION_SPRAY | RESPIRATORY_TRACT | 3 refills | Status: DC

## 2021-05-13 NOTE — Patient Instructions (Signed)
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 No exacerbations in last 8 months  --CONTINUE Fasenra as scheduled --CONTINUE Breztri 160-9-4.5 mcg TWO puffs TWICE daily with aerochamber --CONTINUE Albuterol AS NEEDED for shortness of breath or wheezing --Scheduled for ENT next month --Continue gabapentin.  Allergic Rhinitis --CONTINUE atrovent nasal spray daily --CONTINUE Flonase nightly --CONTINUE cetirizine 10 mg daily --CONTINUE Pseudoephedrine 60 mg every 6 hours as needed. Caution in setting of hypertension  --Continue saline rinses, nettie pot --Minimize outdoor exposure as much as possible  OSA --Reviewed CPAP compliance report --Patient uses NIV for more than four hours nightly for more then 4 hours per night on 100% of nights during the last three months of usage. --Counseled on sleep hygiene --Counseled on weight loss/maintenance of healthy weight --Counseled NOT to drive if/when sleepy

## 2021-05-13 NOTE — Progress Notes (Signed)
@Patient  ID: , female    DOB: 11-Apr-1966, 55 y.o.   MRN: [id number]  Chief Complaint  Patient presents with   Follow-up    Pt states she has some good days and bad days on bad days SOB. She is using albuterol. Pt states she do some breathing treatments. Pts states she is having chest tightness since Saturday    HPI:  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   She reports she has had shortness of breath and chest tightness that began two days ago when helping her family move. Before this she reports her shortness of breath improved since starting May/June 2022. She still has frequent symptoms but not as severe and she has noticed the "flares" don't happen as often. Before starting this she was not able to walk upstairs to church and now able to slowly to get up steps. Allergens and heat worsens her symptoms. She has chronic wheezing but does not stop her from talking. She uses her albuterol three times daily. She reported gabapentin has improved her cough.  Questionaires / Pulmonary Flowsheets:   ACT:  Asthma Control Test ACT Total Score  05/13/2021 8  10/25/2020 6  07/27/2020 9   Tests:   Imaging: CTA 10/11/19 - No pulmonary embolism. Normal parenchyma, no effusion or edema HRCT at [facility] 03/30/2020 (report only) - No evidence of interstitial lung disease. Mild diffuse central airway thickening which can be seen with asthma or bronchitis  PFTs: 01/06/2020 FVC 1.27 [39%], FEV1 1.06 [41%], F/F 84 TLC 3.97 [83%], DLCO 20.20 [104%] Interpretation: Normal spirometry however significant bronchodilator response with post-bronchodilator FEV1 increased by 25% and increased small airway disease response. No evidence of restrictive lung disease. Normal gas exchange  Labs: IgE 01/06/2020- 18 CBC 10/11/2019-WBC 8.7, eos 2%, absolute eosinophil count  200  09/15/2019-SARS-CoV-2-positive  05/30/2020-home sleep study-AHI 8.5, SaO2 low 75%  CPAP Compliance Report 04/13/21-05/12/21 Usage 100% >4 hours 100% AHI 1.0 on NIV   Specialty Problems       Pulmonary Problems   Vocal cord dysfunction    Patient evaluated in March/2021 at [facility] with Dr. [facility].  Findings were positive for paradoxical vocal cord motion during relaxed breathing felt to be contributing to her shortness of breath Referred to speech pathology, now referred to [facility]      OSA (obstructive sleep apnea)   Severe persistent asthma without complication   COVID-19 long hauler manifesting chronic cough    Allergies  Allergen Reactions   Other Hives   Sulfa Antibiotics Hives   Bee Venom Hives    Past Medical History:  Diagnosis Date   Asthma    DVT (deep venous thrombosis) (HCC)    High cholesterol    Hypertension    Pneumonia     Outpatient Encounter Medications as of 05/13/2021  Medication Sig   albuterol (PROVENTIL) (2.5 MG/3ML) 0.083% nebulizer solution Take 3 mLs (2.5 mg total) by nebulization every 6 (six) hours as needed for wheezing or shortness of breath.   albuterol (VENTOLIN HFA) 108 (90 Base) MCG/ACT inhaler INHALE 1 TO 2 PUFFS BY MOUTH EVERY 4 TO 6 HOURS   Benralizumab (FASENRA PEN) 30 MG/ML SOAJ Inject 30mg  into the skin at Week 8 and every 8 weeks thereafter. Week 0 dose in clinic on 4/27, Week 4 on 02/13/21.  Ship to  patient's home - she was trained for self-administration   Budeson-Glycopyrrol-Formoterol (BREZTRI AEROSPHERE) 160-9-4.8 MCG/ACT AERO Inhale 2 puffs into the lungs in the morning and at bedtime.   cetirizine (ZYRTEC) 10 MG tablet Take 1 tablet by mouth once daily   EPINEPHrine 0.3 mg/0.3 mL IJ SOAJ injection SMARTSIG:1 Pre-Filled Pen Syringe IM Once PRN   famotidine (PEPCID) 40 MG tablet Take 1 tablet (40 mg total) by mouth at bedtime.   fluticasone (FLONASE) 50 MCG/ACT nasal spray Place 1 spray into  both nostrils daily.   gabapentin (NEURONTIN) 100 MG capsule Take 100 mg by mouth 3 (three) times daily.   gabapentin (NEURONTIN) 300 MG capsule Take 300 mg by mouth 3 (three) times daily.   ipratropium (ATROVENT) 0.06 % nasal spray Place 2 sprays into both nostrils in the morning and at bedtime.   losartan-hydrochlorothiazide (HYZAAR) 50-12.5 MG tablet Take 1 tablet by mouth daily.   omeprazole (PRILOSEC) 40 MG capsule Take 1 capsule by mouth once daily   ondansetron (ZOFRAN-ODT) 8 MG disintegrating tablet Take 8 mg by mouth every 6 (six) hours as needed.   pseudoephedrine (SUDAFED) 60 MG tablet Take 1 tablet (60 mg total) by mouth every 6 (six) hours as needed for congestion.   No facility-administered encounter medications on file as of 05/13/2021.     Review of Systems Review of Systems  Constitutional:  Negative for chills, diaphoresis, fever, malaise/fatigue and weight loss.  HENT:  Negative for congestion.   Respiratory:  Positive for shortness of breath and wheezing. Negative for cough, hemoptysis and sputum production.   Cardiovascular:  Negative for chest pain, palpitations and leg swelling.    BP (!) 163/80 (BP Location: Left Arm, Patient Position: Sitting, Cuff Size: Large)   Pulse 80   Temp 98.3 F (36.8 C)   Ht 5\' 2"  (1.575 m)   Wt 234 lb 12.8 oz (106.5 kg)   SpO2 99%   BMI 42.95 kg/m   Physical Exam: General: Well-appearing, no acute distress HENT: [place], AT, hoarse, intermittent upper airway wheezing Eyes: EOMI, no scleral icterus Respiratory: Clear to auscultation bilaterally.  No crackles, wheezing or rales Cardiovascular: RRR, -M/R/G, no JVD Extremities:-Edema,-tenderness Neuro: AAO x4, CNII-XII grossly intact Psych: Normal mood, normal affect   Assessment & Plan:  55 year old female never smoker with asthma and vocal cord dysfunction and chronic cough secondary to COVID-19 long hauler syndrome who presents for follow-up. Some improvement after starting  Fasenra May/June 2022. Completed Speech in May.  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 No exacerbations in last 8 months  --CONTINUE Fasenra as scheduled --CONTINUE Breztri 160-9-4.5 mcg TWO puffs TWICE daily with aerochamber --CONTINUE Albuterol AS NEEDED for shortness of breath or wheezing --Scheduled for ENT next month --Continue gabapentin.  Allergic Rhinitis --CONTINUE atrovent nasal spray daily --CONTINUE Flonase nightly --CONTINUE cetirizine 10 mg daily --CONTINUE Pseudoephedrine 60 mg every 6 hours as needed. Caution in setting of hypertension  --Continue saline rinses, nettie pot --Minimize outdoor exposure as much as possible  OSA --Reviewed CPAP compliance report --Patient uses NIV for more than four hours nightly for more then 4 hours per night on 100% of nights during the last three months of usage. --Counseled on sleep hygiene --Counseled on weight loss/maintenance of healthy weight --Counseled NOT to drive if/when sleepy    Health Maintenance Immunization History  Administered Date(s) Administered   Influenza  Whole 07/04/2019, 07/26/2020   No orders of the defined types were placed in this encounter.  Meds ordered this encounter  Medications   albuterol (VENTOLIN HFA) 108 (90 Base) MCG/ACT inhaler    Sig: INHALE 1 TO 2 PUFFS BY MOUTH EVERY 4 TO 6 HOURS    Dispense:  18 g    Refill:  3   Budeson-Glycopyrrol-Formoterol (BREZTRI AEROSPHERE) 160-9-4.8 MCG/ACT AERO    Sig: Inhale 2 puffs into the lungs in the morning and at bedtime.    Dispense:  10.7 g    Refill:  6    Order Specific Question:   Lot Number?    Answer:   [id number]    Order Specific Question:   Expiration Date?    Answer:   03/22/2022    Order Specific Question:   Manufacturer?    Answer:   [employer] [[id number]]    Order Specific  Question:   Quantity    Answer:   4   ipratropium (ATROVENT) 0.06 % nasal spray    Sig: Place 2 sprays into both nostrils in the morning and at bedtime.    Dispense:  15 mL    Refill:  12   Return in about 6 months (around 11/13/2021).  I have spent a total time of 35-minutes on the day of the appointment reviewing prior documentation, coordinating care and discussing medical diagnosis and plan with the patient/family. Past medical history, allergies, medications were reviewed. Pertinent imaging, labs and tests included in this note have been reviewed and interpreted independently by me.   Mechele Collin, M.D. [facility] Pulmonary/Critical Care Medicine 05/13/2021 10:10 AM

## 2021-07-03 ENCOUNTER — Ambulatory Visit: Payer: Self-pay

## 2021-07-08 ENCOUNTER — Other Ambulatory Visit: Payer: Self-pay | Admitting: Pharmacist

## 2021-07-08 DIAGNOSIS — J4550 Severe persistent asthma, uncomplicated: Secondary | ICD-10-CM

## 2021-07-08 MED ORDER — FASENRA PEN 30 MG/ML ~~LOC~~ SOAJ: 30.0000 mg | SUBCUTANEOUS | 2 refills | Status: DC

## 2021-07-08 NOTE — Telephone Encounter (Signed)
Refill for Fasenra 30mg  SQ every 8 weeks sent to [facility].  Patient last seen in clinic on 05/13/21 by Dr. 05/15/21 with plan to continue [facility] as prescribed.  [facility], PharmD, MPH, BCPS Clinical Pharmacist (Rheumatology and Pulmonology)

## 2021-08-28 ENCOUNTER — Ambulatory Visit: Payer: Self-pay

## 2021-09-05 ENCOUNTER — Encounter: Payer: Self-pay | Admitting: Pulmonary Disease

## 2021-09-05 NOTE — Telephone Encounter (Signed)
Appointment has been made for patient and she is aware. Nothing further needed.

## 2021-09-17 ENCOUNTER — Encounter: Payer: Self-pay | Admitting: Pulmonary Disease

## 2021-09-18 MED ORDER — PAXLOVID (300/100) 20 X 150 MG & 10 X 100MG PO TBPK: ORAL_TABLET | ORAL | 0 refills | Status: DC

## 2021-09-18 NOTE — Telephone Encounter (Signed)
[facility] Telephone Encounter  Called patient. Symptoms started 3-4 days ago with cough, aches and shortness of breath. Tested positive yesterday (12/27).   We discussed antiviral options for COVID-19 treatment that the FDA has approved upon emergency use. We discussed potential side effects. Patient agreed to sending antiviral for treatment.  COVID-19 infection --Paxlovid: Follow instructions per packaging for five day course --Call office for worsening symptoms

## 2021-09-18 NOTE — Telephone Encounter (Signed)
JE please advise.  There are 2 emails from this patient.  Her and her husband and grandkids are positive for covid.

## 2021-10-21 ENCOUNTER — Other Ambulatory Visit: Payer: Self-pay | Admitting: Pharmacy Technician

## 2021-10-22 ENCOUNTER — Telehealth: Payer: Self-pay | Admitting: Pharmacy Technician

## 2021-10-22 NOTE — Telephone Encounter (Signed)
Received faxed PA renewal request for Fasenra. Completed and faxed back to [facility].  Phone# [phone]

## 2021-10-23 ENCOUNTER — Ambulatory Visit: Payer: Self-pay

## 2021-10-24 ENCOUNTER — Other Ambulatory Visit (HOSPITAL_COMMUNITY): Payer: Self-pay

## 2021-10-25 NOTE — Telephone Encounter (Signed)
Received notification from [facility] regarding a prior authorization for [facility]. Authorization has been APPROVED from 10/22/21 to 10/21/22.    Authorization # [id number]

## 2021-11-07 ENCOUNTER — Encounter: Payer: Self-pay | Admitting: Pulmonary Disease

## 2021-11-07 ENCOUNTER — Other Ambulatory Visit: Payer: Self-pay

## 2021-11-07 ENCOUNTER — Ambulatory Visit (INDEPENDENT_AMBULATORY_CARE_PROVIDER_SITE_OTHER): Payer: BC Managed Care – PPO | Admitting: Pulmonary Disease

## 2021-11-07 VITALS — BP 122/70 | HR 74 | Temp 98.1°F | Ht 62.0 in | Wt 229.6 lb

## 2021-11-07 DIAGNOSIS — J383 Other diseases of vocal cords: Secondary | ICD-10-CM

## 2021-11-07 DIAGNOSIS — G4733 Obstructive sleep apnea (adult) (pediatric): Secondary | ICD-10-CM

## 2021-11-07 DIAGNOSIS — R053 Chronic cough: Secondary | ICD-10-CM

## 2021-11-07 DIAGNOSIS — U099 Post covid-19 condition, unspecified: Secondary | ICD-10-CM

## 2021-11-07 IMAGING — CT CT ANGIO CHEST
2 of 6 series · 18 of 46 positions shown · IV contrast (Omnipaque or Isovue)
Comparison: None.

CLINICAL DATA: Shortness of breath.

EXAM:
CT ANGIOGRAPHY CHEST WITH CONTRAST
TECHNIQUE: Multidetector CT imaging of the chest was performed using the
standard protocol during bolus administration of intravenous
contrast. Multiplanar CT image reconstructions and MIPs were
obtained to evaluate the vascular anatomy.
CONTRAST:  100mL OMNIPAQUE IOHEXOL 350 MG/ML SOLN

[Series 5: pe axial thins · axial · 0.65mm/px · z∈[+1431,+1632]mm · 15 of 221 slices shown]
[im 10/221  lung]
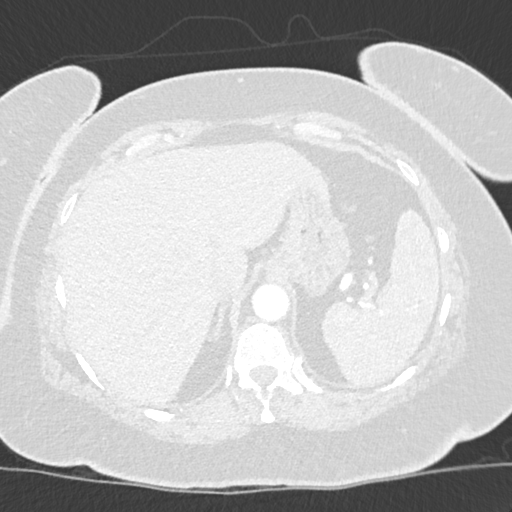
[im 29/221  soft-tissue]
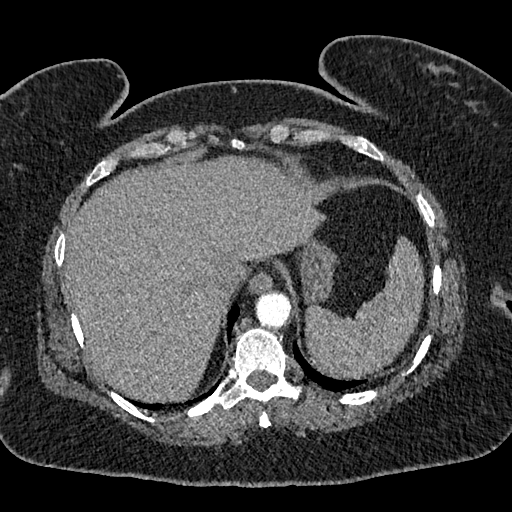
[im 39/221  lung]
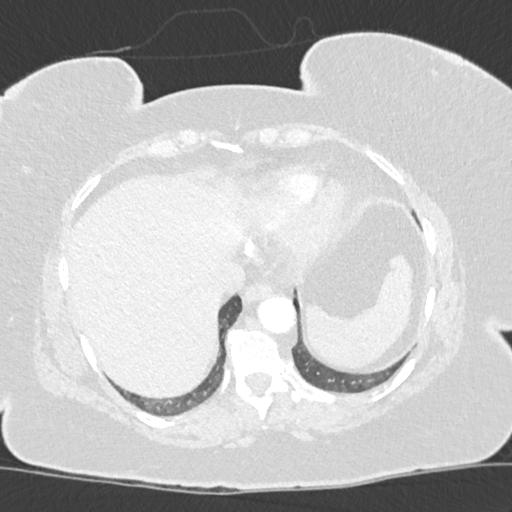
[im 58/221  soft-tissue]
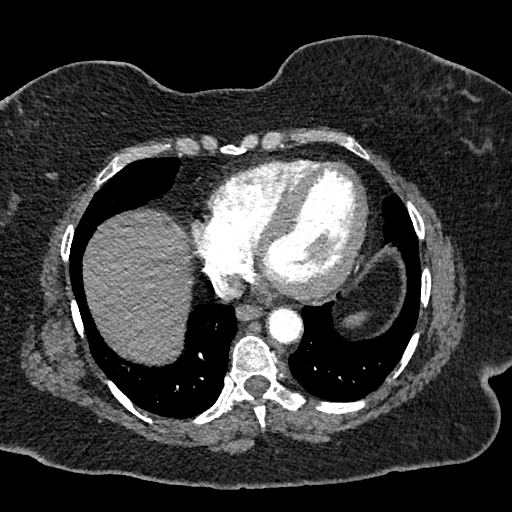
[im 67/221  lung]
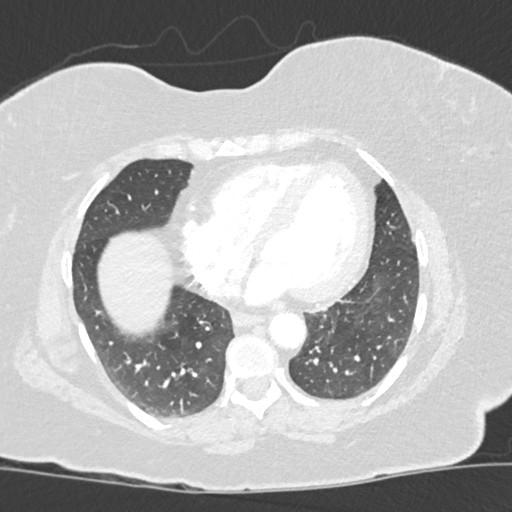
[im 87/221  soft-tissue]
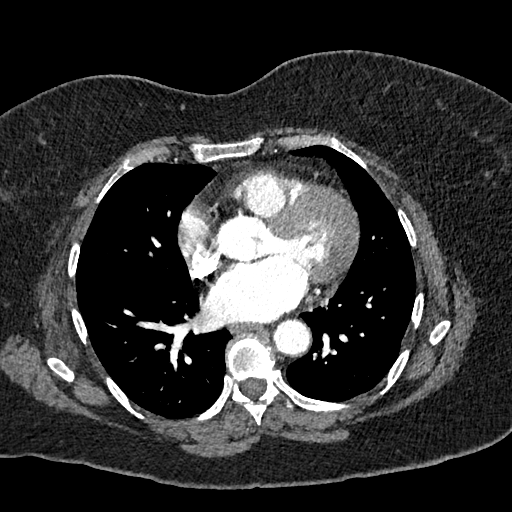
[im 96/221  lung]
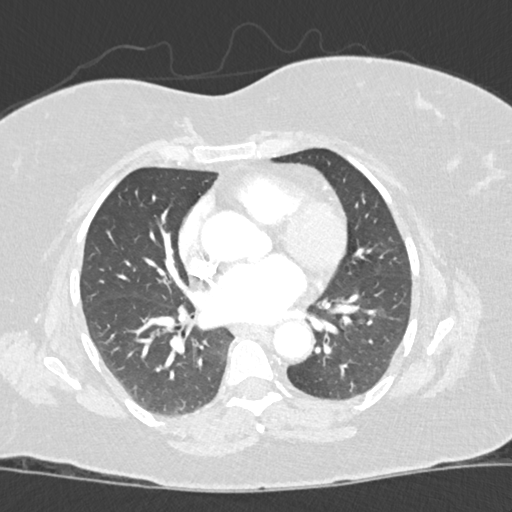
[im 115/221  soft-tissue]
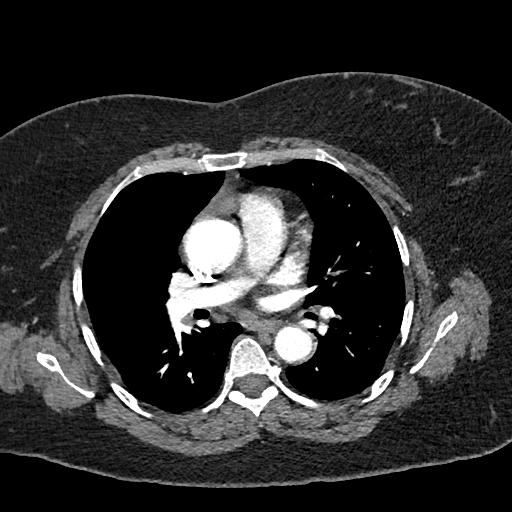
[im 125/221  lung]
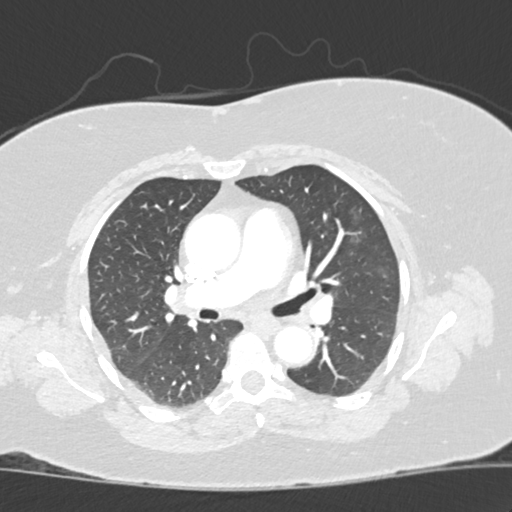
[im 134/221  soft-tissue]
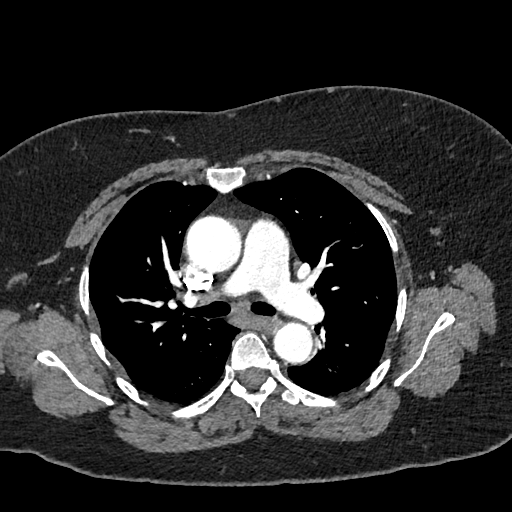
[im 154/221  lung]
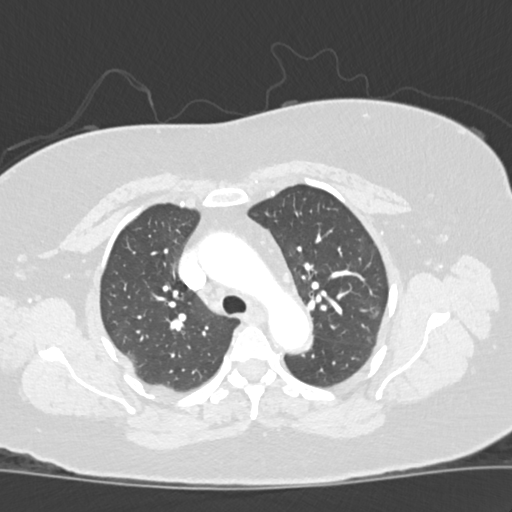
[im 163/221  soft-tissue]
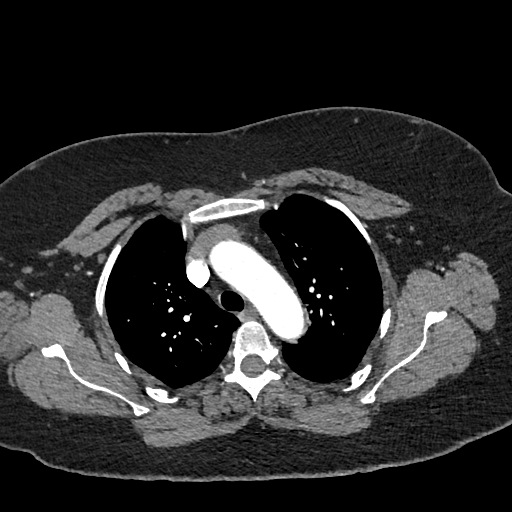
[im 182/221  lung]
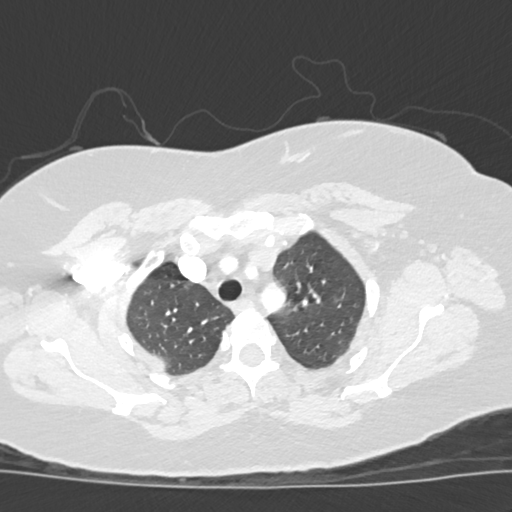
[im 192/221  soft-tissue]
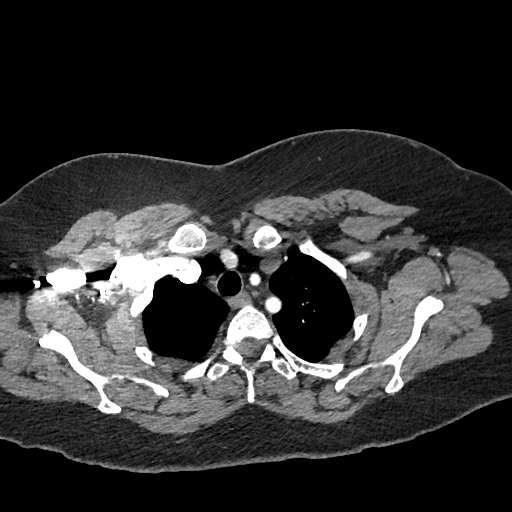
[im 211/221  lung]
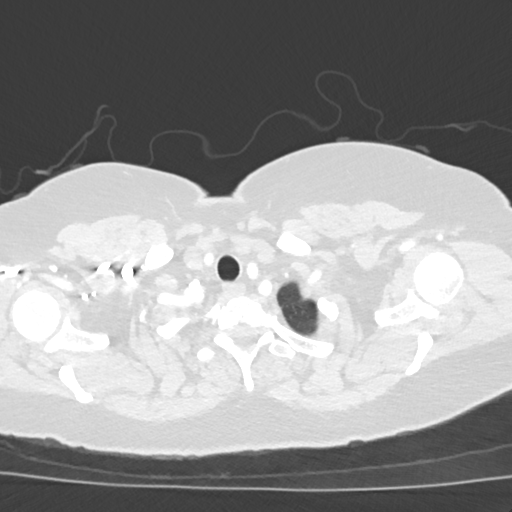

[Series 8: cor soft · coronal · 0.45mm/px · 3 of 112 slices shown]
[im 28/112  soft-tissue]
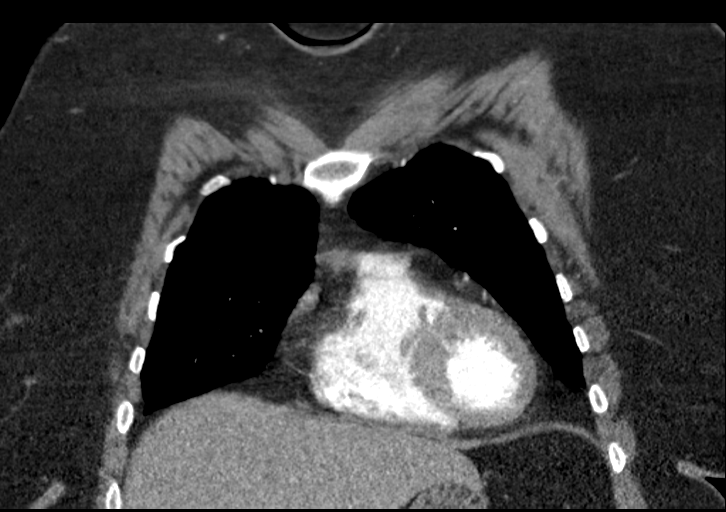
[im 56/112  soft-tissue]
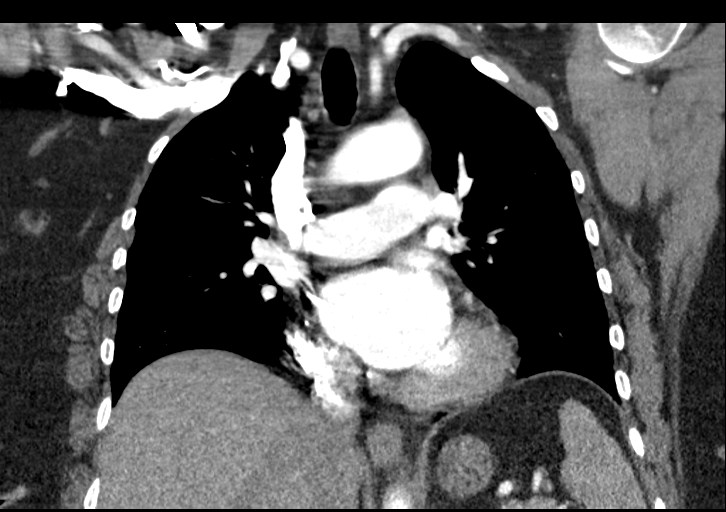
[im 84/112  soft-tissue]
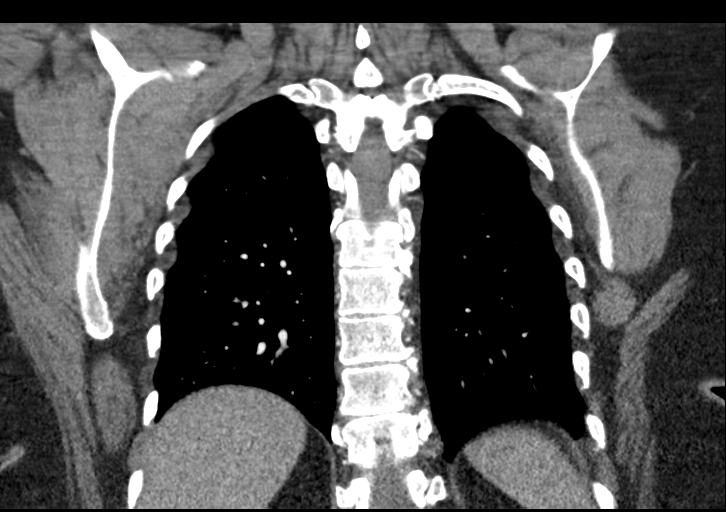

[18 of 46 positions shown; findings below may reference images not displayed]

FINDINGS: Cardiovascular: Satisfactory opacification of the pulmonary arteries
to the segmental level. No evidence of pulmonary embolism. Normal
heart size. No pericardial effusion.

Mediastinum/Nodes: No enlarged mediastinal, hilar, or axillary lymph
nodes. Thyroid gland, trachea, and esophagus demonstrate no
significant findings.

Lungs/Pleura: Lungs are clear. No pleural effusion or pneumothorax.

Upper Abdomen: No acute abnormality.

Musculoskeletal: No chest wall abnormality. No acute or significant
osseous findings.

Review of the MIP images confirms the above findings.
IMPRESSION: No CT evidence of pulmonary embolism or acute cardiopulmonary
disease.

## 2021-11-07 MED ORDER — ALBUTEROL SULFATE HFA 108 (90 BASE) MCG/ACT IN AERS: INHALATION_SPRAY | RESPIRATORY_TRACT | 3 refills | Status: DC

## 2021-11-07 MED ORDER — BREZTRI AEROSPHERE 160-9-4.8 MCG/ACT IN AERO: 2.0000 | INHALATION_SPRAY | Freq: Two times a day (BID) | RESPIRATORY_TRACT | 6 refills | Status: DC

## 2021-11-07 NOTE — Progress Notes (Signed)
Patient ID: Jane Bennett, female    DOB: Oct 28, 1965, 56 y.o.   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   Since her last visit she had COVID-19 again in December 2022.  She called our office and was treated with Paxlovid.  She was seen by ENT in June 05 2021. Note reviewed. They have recommended continuing gabapentin 300 mg 3 times daily and has offered Botox injections for her vocal folds which she starts this month.   She has been on Fasenra for >6 months and feels this is improving her symptoms. She did have a flare in November requiring steroids. She is using her albuterol at baseline 2-3 times a day. Has not needed to use her nebulizer. Triggered by heat, activity and strong odors. Her voice overall is improving. Inspiratory and expiratory wheezing remains chronic. Continues to have chronic cough.   She is compliant with her CPAP every night at least for 5 hours. This has improved her sleep quality, energy and shortness of breath.  Questionaires / Pulmonary Flowsheets:   ACT:  Asthma Control Test ACT Total Score  11/07/2021 8  05/13/2021 8  10/25/2020 6    Specialty Problems       Pulmonary Problems   Vocal cord dysfunction    Patient evaluated in March/2021 at [facility] with Dr. Redmond Baseman.  Findings were positive for paradoxical vocal cord motion during relaxed breathing felt to be contributing to her shortness of breath Referred to speech pathology, now referred to [facility]      OSA (obstructive sleep apnea)   Severe persistent asthma without complication   [id number] long hauler manifesting chronic cough    Allergies  Allergen Reactions   Other Hives   Sulfa Antibiotics Hives    Bee Venom Hives    Past Medical History:  Diagnosis Date   Asthma    DVT (deep venous thrombosis) (HCC)    High cholesterol    Hypertension    Pneumonia     Outpatient Encounter Medications as of 11/07/2021  Medication Sig   albuterol (PROVENTIL) (2.5 MG/3ML) 0.083% nebulizer solution Take 3 mLs (2.5 mg total) by nebulization every 6 (six) hours as needed for wheezing or shortness of breath.   Benralizumab (FASENRA PEN) 30 MG/ML SOAJ Inject 1 mL (30 mg total) into the skin every 8 (eight) weeks.   cetirizine (ZYRTEC) 10 MG tablet Take 1 tablet by mouth once daily   EPINEPHrine 0.3 mg/0.3 mL IJ SOAJ injection SMARTSIG:1 Pre-Filled Pen Syringe IM Once PRN   famotidine (PEPCID) 40 MG tablet Take 1 tablet (40 mg total) by mouth at bedtime.   fluticasone (FLONASE) 50 MCG/ACT nasal spray Place 1 spray into both nostrils daily.   gabapentin (NEURONTIN) 300 MG capsule Take 300 mg by mouth 3 (three) times daily.   ipratropium (ATROVENT) 0.06 % nasal spray Place 2 sprays into both nostrils in the morning and at bedtime.   losartan-hydrochlorothiazide (HYZAAR) 50-12.5 MG tablet Take 1 tablet by mouth daily.   omeprazole (PRILOSEC) 40 MG capsule Take 1 capsule by mouth once daily   ondansetron (ZOFRAN-ODT) 8 MG disintegrating tablet Take 8 mg by mouth every 6 (six) hours as needed.  pseudoephedrine (SUDAFED) 60 MG tablet Take 1 tablet (60 mg total) by mouth every 6 (six) hours as needed for congestion.   [DISCONTINUED] albuterol (VENTOLIN HFA) 108 (90 Base) MCG/ACT inhaler INHALE 1 TO 2 PUFFS BY MOUTH EVERY 4 TO 6 HOURS   [DISCONTINUED] Budeson-Glycopyrrol-Formoterol (BREZTRI AEROSPHERE) 160-9-4.8 MCG/ACT AERO Inhale 2 puffs into the lungs in the morning and at bedtime.   albuterol (VENTOLIN HFA) 108 (90 Base) MCG/ACT inhaler INHALE 1 TO 2 PUFFS BY MOUTH EVERY 4 TO 6 HOURS   Budeson-Glycopyrrol-Formoterol (BREZTRI AEROSPHERE) 160-9-4.8 MCG/ACT AERO Inhale 2 puffs into the lungs in the morning and at  bedtime.   gabapentin (NEURONTIN) 100 MG capsule Take 100 mg by mouth 3 (three) times daily. (Patient not taking: Reported on 11/07/2021)   nirmatrelvir & ritonavir (PAXLOVID, 300/100,) 20 x 150 MG & 10 x 100MG  TBPK Follow instructions per packaging. (Patient not taking: Reported on 11/07/2021)   No facility-administered encounter medications on file as of 11/07/2021.     Review of Systems Review of Systems  Constitutional:  Negative for chills, diaphoresis, fever, malaise/fatigue and weight loss.  HENT:  Negative for congestion.   Respiratory:  Positive for cough, shortness of breath and wheezing. Negative for hemoptysis and sputum production.   Cardiovascular:  Negative for chest pain, palpitations and leg swelling.    BP 122/70 (BP Location: Left Arm, Cuff Size: Large)    Pulse 74    Temp 98.1 F (36.7 C) (Oral)    Ht 5\' 2"  (1.575 m)    Wt 229 lb 9.6 oz (104.1 kg)    SpO2 98%    BMI 41.99 kg/m   Physical Exam: General: Well-appearing, no acute distress HENT: [place], AT Eyes: EOMI, no scleral icterus Respiratory: Upper airway wheezing. Clear to auscultation bilaterally.  No crackles, wheezing or rales Cardiovascular: RRR, -M/R/G, no JVD Extremities:-Edema,-tenderness Neuro: AAO x4, CNII-XII grossly intact Psych: Normal mood, normal affect   Data Reviewed:  Imaging: CTA 10/11/19 - No pulmonary embolism. Normal parenchyma, no effusion or edema HRCT at [facility] 03/30/2020 (report only) - No evidence of interstitial lung disease. Mild diffuse central airway thickening which can be seen with asthma or bronchitis  PFTs: 01/06/2020 FVC 1.27 [39%], FEV1 1.06 [41%], F/F 84 TLC 3.97 [83%], DLCO 20.20 [104%] Interpretation: Normal spirometry however significant bronchodilator response with post-bronchodilator FEV1 increased by 25% and increased small airway disease response. No evidence of restrictive lung disease. Normal gas exchange  Labs: IgE 01/06/2020- 18 CBC 10/11/2019-WBC 8.7, eos 2%,  absolute eosinophil count 200  09/15/2019-SARS-CoV-2-positive  05/30/2020-home sleep study-AHI 8.5, SaO2 low 75%  CPAP Compliance Report 10/08/21-11/06/21 Usage 100% >4 hours 100% AHI 0.9   Assessment & Plan:  56 year old female never smoker with asthma and vocal cord dysfunction and chronic cough secondary to COVID-19 long-hauler syndrome who presents for follow-up.  Started on Fasenra in May/June 2022 for her asthma. Symptoms are improved on current regimen however recur when she is near her next injection.  Covid long hauler manifested as chronic cough, vocal cord dysfunction - Improved voice, unchanged wheezing Severe eosinophilia persistent asthma - improved control, breakthrough symptoms between injections --CONTINUE Fasenra as scheduled --CONTINUE Breztri 160-9-4.5 mcg TWO puffs TWICE daily with aerochamber. REFILL --CONTINUE Albuterol AS NEEDED for shortness of breath or wheezing. REFILL --Continue gabapentin as prescribed by ENT  Hoarseness - improved compared to one year ago Vocal cord dysfunction --Planning for Botox  Allergic Rhinitis --CONTINUE atrovent nasal spray daily --CONTINUE Flonase nightly --CONTINUE cetirizine 10 mg daily --CONTINUE Pseudoephedrine 60  mg every 6 hours as needed. Caution in setting of hypertension  --Continue saline rinses, nettie pot --Minimize outdoor exposure as much as possible  OSA --Reviewed CPAP compliance report --Patient uses NIV for more than four hours nightly for more then 4 hours per night on 70% of nights --Counseled on sleep hygiene --Counseled on weight loss/maintenance of healthy weight --Counseled NOT to drive if/when sleepy    Health Maintenance Immunization History  Administered Date(s) Administered   Influenza Whole 07/04/2019, 07/26/2020   No orders of the defined types were placed in this encounter.   Meds ordered this encounter  Medications   Budeson-Glycopyrrol-Formoterol (BREZTRI AEROSPHERE) 160-9-4.8  MCG/ACT AERO    Sig: Inhale 2 puffs into the lungs in the morning and at bedtime.    Dispense:  10.7 g    Refill:  6    Order Specific Question:   Lot Number?    Answer:   [id number]    Order Specific Question:   Expiration Date?    Answer:   03/22/2022    Order Specific Question:   Manufacturer?    Answer:   [employer] [[id number]]    Order Specific Question:   Quantity    Answer:   4   albuterol (VENTOLIN HFA) 108 (90 Base) MCG/ACT inhaler    Sig: INHALE 1 TO 2 PUFFS BY MOUTH EVERY 4 TO 6 HOURS    Dispense:  18 g    Refill:  3   Return in about 4 months (around 03/07/2022).  I have spent a total time of 38-minutes on the day of the appointment reviewing prior documentation, coordinating care and discussing medical diagnosis and plan with the patient/family. Past medical history, allergies, medications were reviewed. Pertinent imaging, labs and tests included in this note have been reviewed and interpreted independently by me.  Rodman Pickle, MD [facility] Pulmonary/Critical Care Medicine 11/07/2021 10:08 AM

## 2021-11-07 NOTE — Patient Instructions (Signed)
Severe eosinophilia persistent asthma - improving. No exacerbations in last 8 months  --CONTINUE Fasenra as scheduled --CONTINUE Breztri 160-9-4.5 mcg TWO puffs TWICE daily with aerochamber. REFILL --CONTINUE Albuterol AS NEEDED for shortness of breath or wheezing. REFILL --Continue gabapentin as prescribed by ENT  Vocal cord dysfunction --Planning for Botox  Allergic Rhinitis --CONTINUE atrovent nasal spray daily --CONTINUE Flonase nightly --CONTINUE cetirizine 10 mg daily --CONTINUE Pseudoephedrine 60 mg every 6 hours as needed. Caution in setting of hypertension  --Continue saline rinses, nettie pot --Minimize outdoor exposure as much as possible  OSA --Reviewed CPAP compliance report --Patient uses NIV for more than four hours nightly for more then 4 hours per night on 70% of nights during the last three months of usage. --Counseled on sleep hygiene --Counseled on weight loss/maintenance of healthy weight --Counseled NOT to drive if/when sleepy  Follow-up with me in 4 months

## 2021-12-01 IMAGING — US US EXTREM LOW VENOUS*R*
1 series · 13 of 24 positions shown · non-contrast
Comparison: None.

CLINICAL DATA: History of DVT now with right lower extremity pain
and edema. Patient is currently on anticoagulation. Evaluate for
acute or chronic DVT.



[Series 1: us extrem low venous*right* · 13 of 47 slices shown]
[im 1/47]
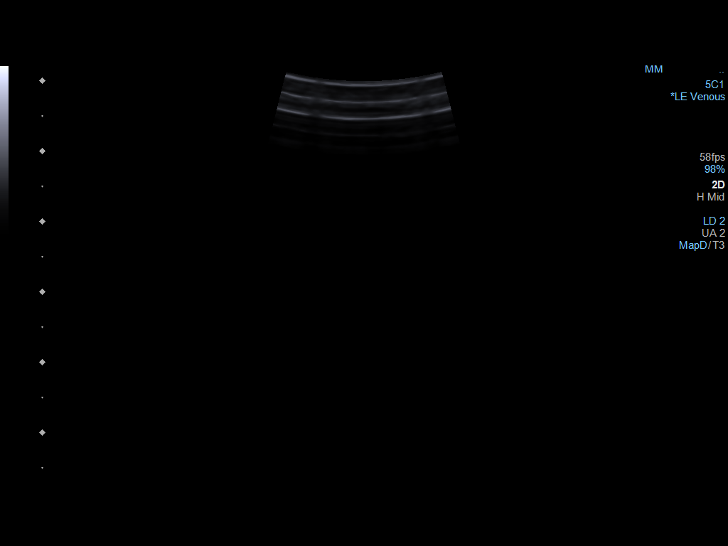
[im 5/47]
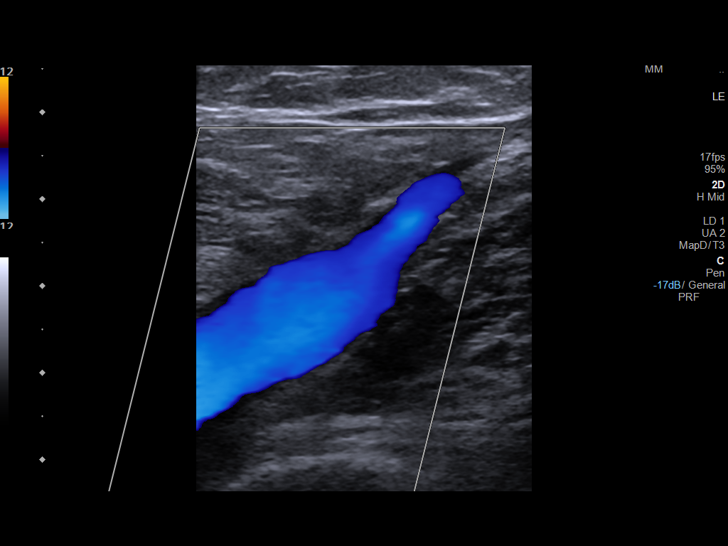
[im 9/47]
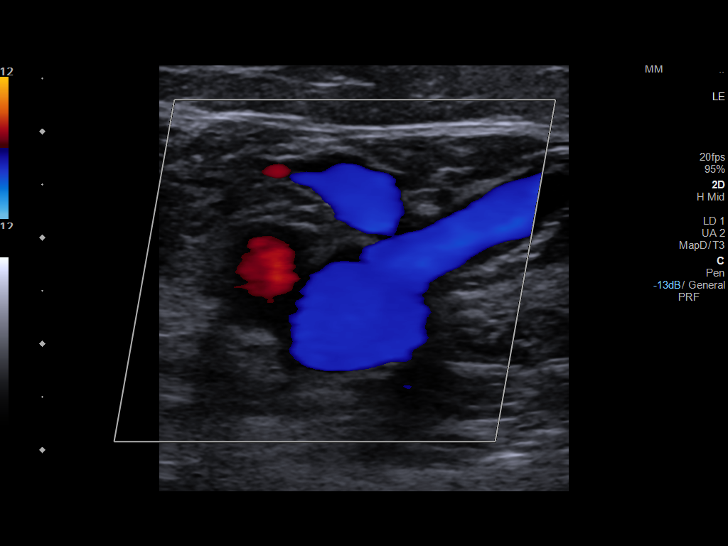
[im 13/47]
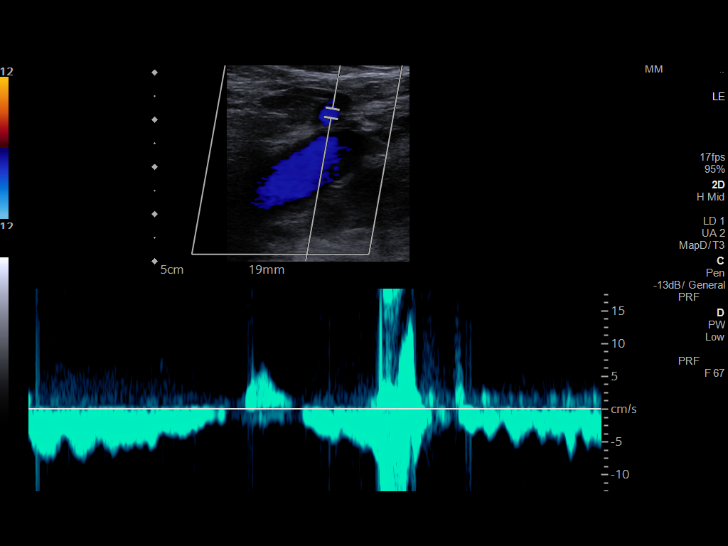
[im 17/47]
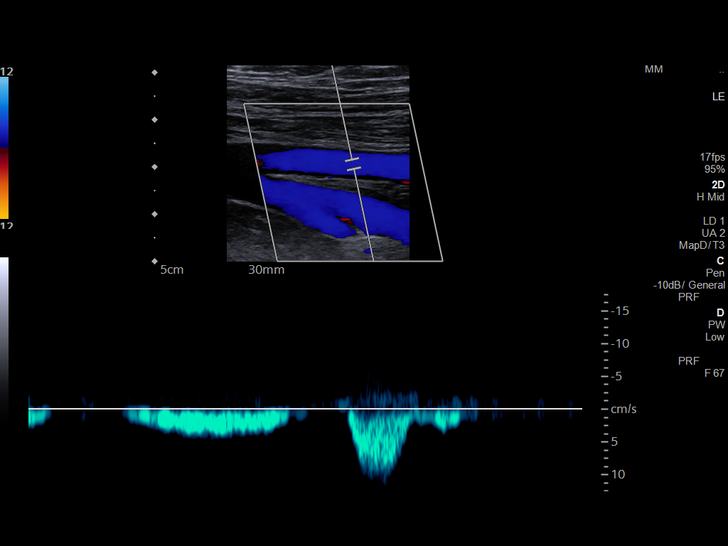
[im 21/47]
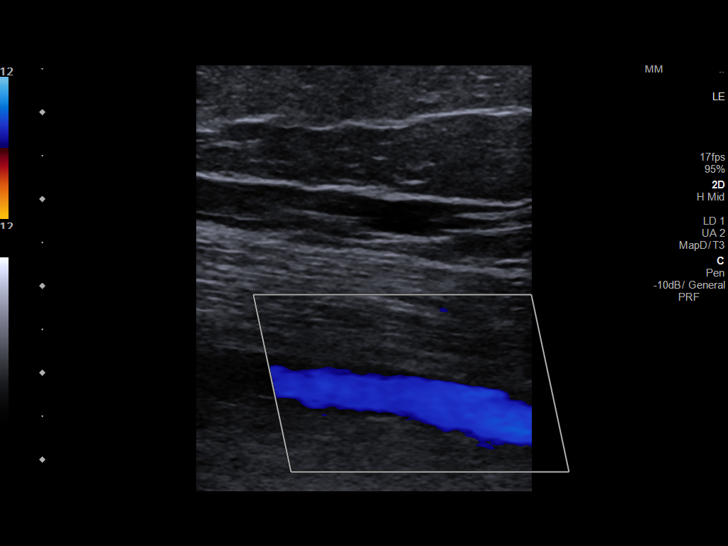
[im 25/47]
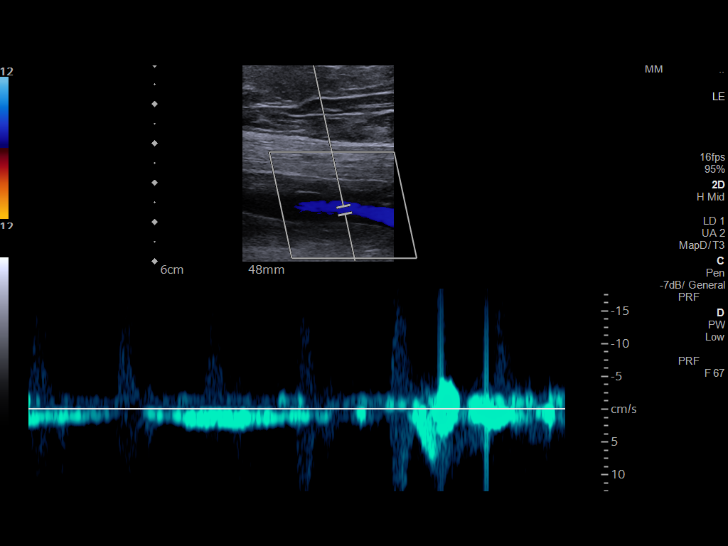
[im 27/47]
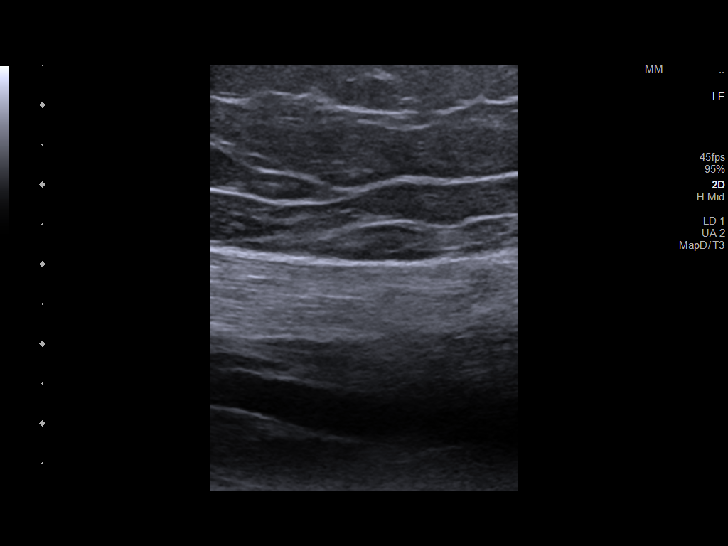
[im 31/47]
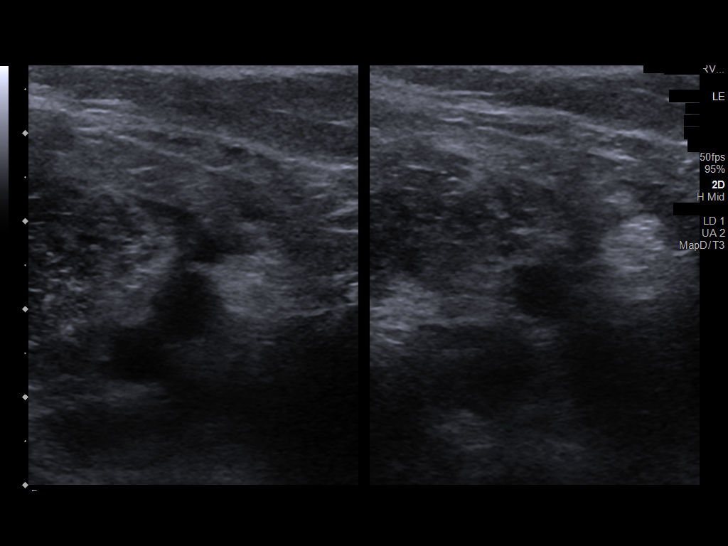
[im 35/47]
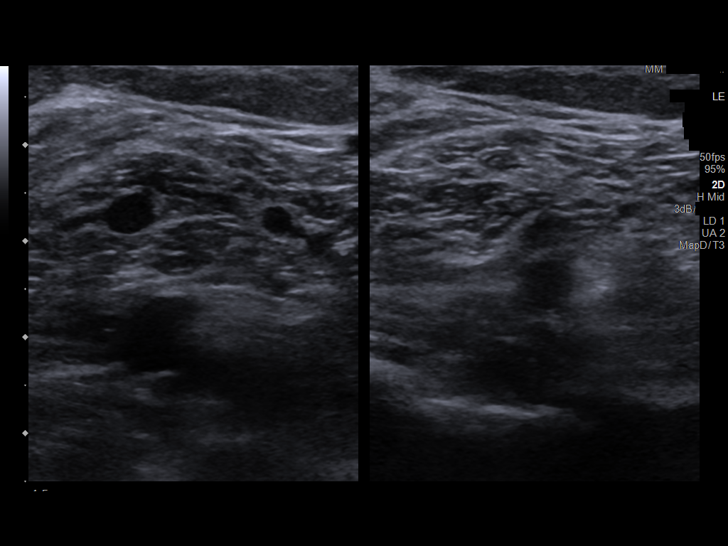
[im 39/47]
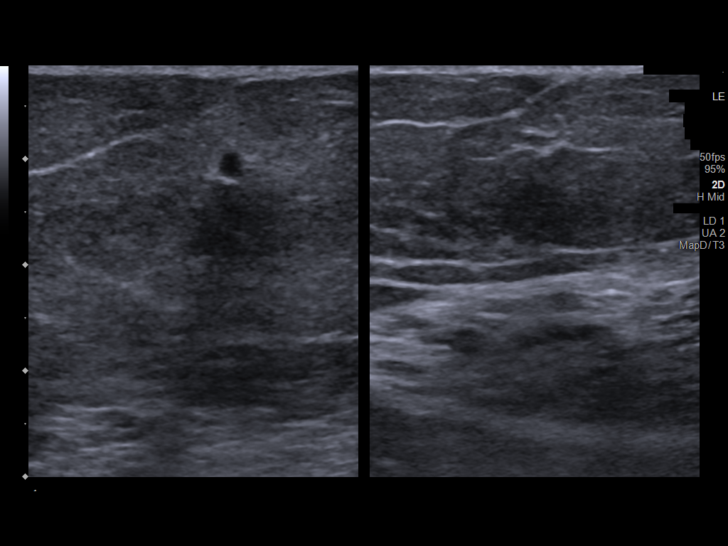
[im 43/47]
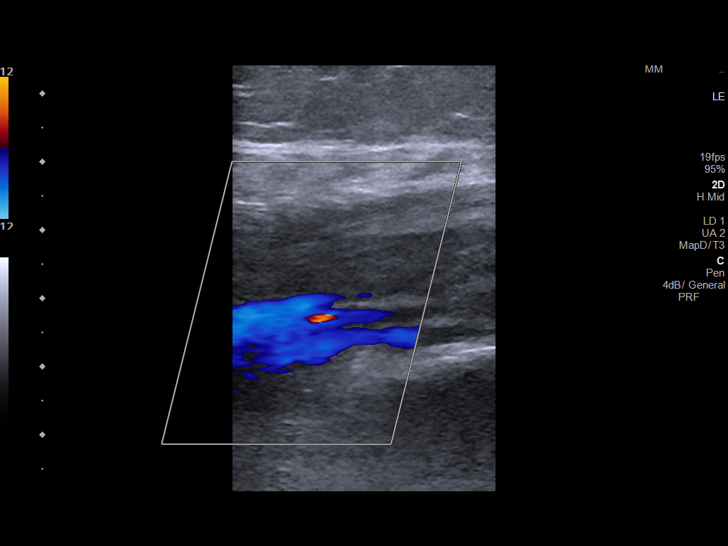
[im 47/47]
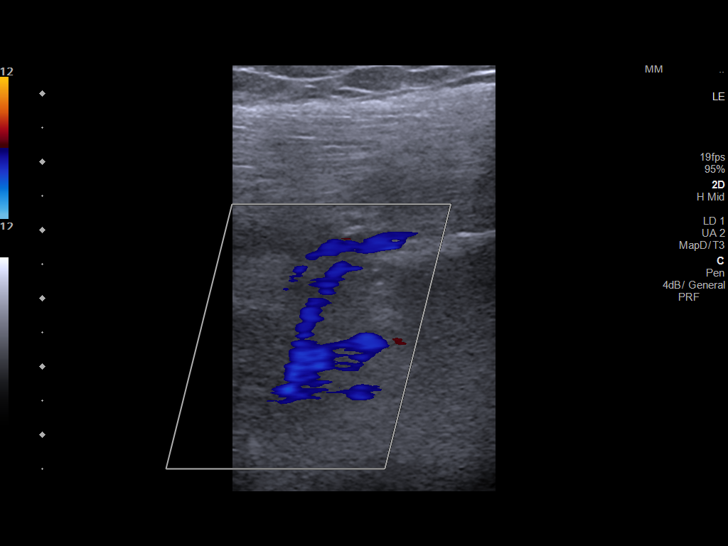

[13 of 24 positions shown; findings below may reference images not displayed]

FINDINGS: Contralateral Common Femoral Vein: Respiratory phasicity is normal
and symmetric with the symptomatic side. No evidence of thrombus.
Normal compressibility.

Common Femoral Vein: No evidence of thrombus. Normal
compressibility, respiratory phasicity and response to augmentation.

Saphenofemoral Junction: No evidence of thrombus. Normal
compressibility and flow on color Doppler imaging.

Profunda Femoral Vein: No evidence of thrombus. Normal
compressibility and flow on color Doppler imaging.

Femoral Vein: No evidence of thrombus. Normal compressibility,
respiratory phasicity and response to augmentation.

Popliteal Vein: No evidence of thrombus. Normal compressibility,
respiratory phasicity and response to augmentation.

Calf Veins: Appear patent where imaged.

Superficial Great Saphenous Vein: No evidence of thrombus. Normal
compressibility.

Venous Reflux:  None.

Other Findings:  None.
IMPRESSION: No evidence of acute or chronic DVT within the right lower
extremity.

## 2021-12-16 ENCOUNTER — Other Ambulatory Visit: Payer: Self-pay | Admitting: Pharmacist

## 2021-12-16 DIAGNOSIS — J4550 Severe persistent asthma, uncomplicated: Secondary | ICD-10-CM

## 2021-12-16 MED ORDER — FASENRA PEN 30 MG/ML ~~LOC~~ SOAJ: 30.0000 mg | SUBCUTANEOUS | 2 refills | Status: DC

## 2021-12-16 NOTE — Telephone Encounter (Signed)
Refill sent for [facility] to [facility]: [phone] ? ?Dose: 30 mg SQ every 8 weeks ? ?Last OV: 11/07/21 ?Provider: Dr. Everardo All ? ?Next OV: 4 months not yet scheduled ? ?Chesley Mires, PharmD, MPH, BCPS ?Clinical Pharmacist (Rheumatology and Pulmonology)  ?

## 2022-01-03 ENCOUNTER — Other Ambulatory Visit: Payer: Self-pay | Admitting: Pulmonary Disease

## 2022-01-03 DIAGNOSIS — J302 Other seasonal allergic rhinitis: Secondary | ICD-10-CM

## 2022-01-03 DIAGNOSIS — K219 Gastro-esophageal reflux disease without esophagitis: Secondary | ICD-10-CM

## 2022-01-03 DIAGNOSIS — J383 Other diseases of vocal cords: Secondary | ICD-10-CM

## 2022-01-08 ENCOUNTER — Encounter: Payer: Self-pay | Admitting: Pulmonary Disease

## 2022-03-07 ENCOUNTER — Encounter: Payer: Self-pay | Admitting: Pulmonary Disease

## 2022-03-07 ENCOUNTER — Ambulatory Visit (INDEPENDENT_AMBULATORY_CARE_PROVIDER_SITE_OTHER): Payer: BC Managed Care – PPO | Admitting: Pulmonary Disease

## 2022-03-07 VITALS — BP 136/80 | HR 71 | Temp 98.3°F | Ht 62.0 in | Wt 230.6 lb

## 2022-03-07 DIAGNOSIS — J4551 Severe persistent asthma with (acute) exacerbation: Secondary | ICD-10-CM | POA: Diagnosis not present

## 2022-03-07 MED ORDER — PREDNISONE 10 MG PO TABS: ORAL_TABLET | ORAL | 0 refills | Status: AC

## 2022-03-07 MED ORDER — BREZTRI AEROSPHERE 160-9-4.8 MCG/ACT IN AERO: 2.0000 | INHALATION_SPRAY | Freq: Two times a day (BID) | RESPIRATORY_TRACT | 6 refills | Status: DC

## 2022-03-07 MED ORDER — ALBUTEROL SULFATE HFA 108 (90 BASE) MCG/ACT IN AERS: INHALATION_SPRAY | RESPIRATORY_TRACT | 3 refills | Status: DC

## 2022-03-07 NOTE — Progress Notes (Signed)
Patient ID: Jane Bennett, female    DOB: 11/26/1965, 56 y.o.   MRN: [id number]  No chief complaint on file.  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   Since our last visit she has started Botox injections for laryngeal spasm with some improvement with her breathing but no plans further. ENT note by Dr. Rubye Oaks on 11/29/21 reviewed.   For her asthma she has been on [place] nearly one year. Last flare was 08/2021 when she had COVID. Today she had worsening productive cough, shortness of breath and wheezing on top of her baseline inspiratory and expiratory wheezing and chronic cough. She went to the beach last week and has been outdoors most of time. It worsened after her husband mowed the lawn yesterday. Usually triggered by heat, humidity, allergens and illness. Her voice overall has improved. At baseline she has dedicated exercise at the gym twice a week for 1 hour 15 min.  She is compliant with CPAP nightly. This has improved her sleep quality, energy and shortness of breath.  Questionaires / Pulmonary Flowsheets:   ACT:  Asthma Control Test ACT Total Score  03/07/2022  8:45 AM 7  11/07/2021  8:45 AM 8  05/13/2021 10:17 AM 8    Specialty Problems       Pulmonary Problems   Vocal cord dysfunction    Patient evaluated in March/2021 at [facility] with Dr. Jenne Pane.  Findings were positive for paradoxical vocal cord motion during relaxed breathing felt to be contributing to her shortness of breath Referred to speech pathology, now referred to [facility]      OSA (obstructive sleep apnea)   Severe persistent asthma without complication   COVID-19 long hauler manifesting chronic cough    Allergies  Allergen Reactions   Other Hives   Sulfa Antibiotics Hives   Bee Venom Hives    Past Medical History:   Diagnosis Date   Asthma    DVT (deep venous thrombosis) (HCC)    High cholesterol    Hypertension    Pneumonia     Outpatient Encounter Medications as of 03/07/2022  Medication Sig   albuterol (PROVENTIL) (2.5 MG/3ML) 0.083% nebulizer solution Take 3 mLs (2.5 mg total) by nebulization every 6 (six) hours as needed for wheezing or shortness of breath.   albuterol (VENTOLIN HFA) 108 (90 Base) MCG/ACT inhaler INHALE 1 TO 2 PUFFS BY MOUTH EVERY 4 TO 6 HOURS   Benralizumab (FASENRA PEN) 30 MG/ML SOAJ Inject 1 mL (30 mg total) into the skin every 8 (eight) weeks.   Budeson-Glycopyrrol-Formoterol (BREZTRI AEROSPHERE) 160-9-4.8 MCG/ACT AERO Inhale 2 puffs into the lungs in the morning and at bedtime.   cetirizine (ZYRTEC) 10 MG tablet Take 1 tablet by mouth once daily   EPINEPHrine 0.3 mg/0.3 mL IJ SOAJ injection SMARTSIG:1 Pre-Filled Pen Syringe IM Once PRN   famotidine (PEPCID) 40 MG tablet Take 1 tablet (40 mg total) by mouth at bedtime.   fluticasone (FLONASE) 50 MCG/ACT nasal spray Place 1 spray into both nostrils daily.   gabapentin (NEURONTIN) 100 MG capsule Take 100 mg by mouth 3 (three) times daily. (Patient not taking: Reported on 11/07/2021)   gabapentin (NEURONTIN) 300 MG capsule Take 300 mg by mouth 3 (three) times daily.   ipratropium (ATROVENT) 0.06 % nasal spray Place 2 sprays into both nostrils in  the morning and at bedtime.   losartan-hydrochlorothiazide (HYZAAR) 50-12.5 MG tablet Take 1 tablet by mouth daily.   nirmatrelvir & ritonavir (PAXLOVID, 300/100,) 20 x 150 MG & 10 x 100MG  TBPK Follow instructions per packaging. (Patient not taking: Reported on 11/07/2021)   omeprazole (PRILOSEC) 40 MG capsule Take 1 capsule by mouth once daily   ondansetron (ZOFRAN-ODT) 8 MG disintegrating tablet Take 8 mg by mouth every 6 (six) hours as needed.   pseudoephedrine (SUDAFED) 60 MG tablet Take 1 tablet (60 mg total) by mouth every 6 (six) hours as needed for congestion.   No  facility-administered encounter medications on file as of 03/07/2022.     Review of Systems Review of Systems  Constitutional:  Negative for chills, diaphoresis, fever, malaise/fatigue and weight loss.  HENT:  Negative for congestion.   Respiratory:  Positive for cough and wheezing. Negative for hemoptysis, sputum production and shortness of breath.   Cardiovascular:  Negative for chest pain, palpitations and leg swelling.     There were no vitals taken for this visit.  Physical Exam: General: Well-appearing, no acute distress HENT: [place], AT Eyes: EOMI, no scleral icterus Respiratory: Expiratory wheezes Cardiovascular: RRR, -M/R/G, no JVD Extremities:-Edema,-tenderness Neuro: AAO x4, CNII-XII grossly intact Psych: Normal mood, normal affect  Data Reviewed:  Imaging: CTA 10/11/19 - No pulmonary embolism. Normal parenchyma, no effusion or edema [facility]CT at [facility] 03/30/2020 (report only) - No evidence of interstitial lung disease. Mild diffuse central airway thickening which can be seen with asthma or bronchitis  PFTs: 01/06/2020 FVC 1.27 [39%], FEV1 1.06 [41%], F/F 84 TLC 3.97 [83%], DLCO 20.20 [104%] Interpretation: Normal spirometry however significant bronchodilator response with post-bronchodilator FEV1 increased by 25% and increased small airway disease response. No evidence of restrictive lung disease. Normal gas exchange  Labs: IgE 01/06/2020- 18 CBC 10/11/2019-WBC 8.7, eos 2%, absolute eosinophil count 200  09/15/2019-SARS-CoV-2-positive  05/30/2020-home sleep study-AHI 8.5, SaO2 low 75%  CPAP Compliance Report 02/04/22-03/05/22 Usage 97% >4 hours 97% AHI 0.8   Assessment & Plan:  56 year old female never smoker with asthma and vocal cord dysfunction and chronic cough secondary to COVID-19 long-hauler syndrome who presents for follow-up.  Started on Fasenra in May/June 2022 for her asthma.  Currently in exacerbation.  Severe eosinophilia persistent asthma and  exacerbation --Prednisone taper --CONTINUE Fasenra as scheduled --CONTINUE Breztri 160-9-4.5 mcg TWO puffs TWICE daily with aerochamber. REFILL --CONTINUE Albuterol AS NEEDED for shortness of breath or wheezing. REFILL --Continue gabapentin as prescribed by ENT  Hoarseness - improved compared to one year ago Vocal cord dysfunction secondary to COVID-19 long hauler - improved wheezing --ENT: Botox x 1 with minimal benefit   Allergic Rhinitis --CONTINUE atrovent nasal spray daily --CONTINUE Flonase nightly --CONTINUE cetirizine 10 mg daily --CONTINUE Pseudoephedrine 60 mg every 6 hours as needed. Caution in setting of hypertension  --Continue saline rinses, nettie pot --Minimize outdoor exposure as much as possible  OSA --Reviewed CPAP compliance report. --Patient uses NIV formore then 4 hours per night on 70% of nights during the last three months of usage. --Counseled on sleep hygiene --Counseled on weight loss/maintenance of healthy weight --Counseled NOT to drive if/when sleepy    Health Maintenance Immunization History  Administered Date(s) Administered   Influenza Whole 07/04/2019, 07/26/2020   No orders of the defined types were placed in this encounter.   No orders of the defined types were placed in this encounter.  No follow-ups on file.  I have spent a total time of 38-minutes on the day  of the appointment reviewing prior documentation, coordinating care and discussing medical diagnosis and plan with the patient/family. Past medical history, allergies, medications were reviewed. Pertinent imaging, labs and tests included in this note have been reviewed and interpreted independently by me.  Mechele Collin, MD [facility] Pulmonary/Critical Care Medicine 03/07/2022 8:27 AM

## 2022-03-07 NOTE — Patient Instructions (Signed)
Severe eosinophilia persistent asthma and exacerbation --Prednisone taper --CONTINUE Fasenra as scheduled --CONTINUE Breztri 160-9-4.5 mcg TWO puffs TWICE daily with aerochamber. REFILL --CONTINUE Albuterol AS NEEDED for shortness of breath or wheezing. REFILL --Continue gabapentin as prescribed by ENT  Follow-up with me in 6 months

## 2022-05-07 NOTE — Telephone Encounter (Signed)
Closing encounter

## 2022-06-01 ENCOUNTER — Other Ambulatory Visit: Payer: Self-pay | Admitting: Pulmonary Disease

## 2022-06-01 DIAGNOSIS — J4550 Severe persistent asthma, uncomplicated: Secondary | ICD-10-CM

## 2022-06-02 NOTE — Telephone Encounter (Signed)
Refill sent for [facility] to [facility]: [phone]  Dose: 30 mg SQ every 8 weeks  Last OV: 03/07/22 Provider: Dr. Everardo All  Next OV: 6 months (not yet scheduled)  Chesley Mires, PharmD, MPH, BCPS Clinical Pharmacist (Rheumatology and Pulmonology)

## 2022-06-18 ENCOUNTER — Other Ambulatory Visit: Payer: Self-pay | Admitting: Pulmonary Disease

## 2022-06-18 DIAGNOSIS — J383 Other diseases of vocal cords: Secondary | ICD-10-CM

## 2022-06-18 DIAGNOSIS — J302 Other seasonal allergic rhinitis: Secondary | ICD-10-CM

## 2022-06-18 DIAGNOSIS — K219 Gastro-esophageal reflux disease without esophagitis: Secondary | ICD-10-CM

## 2022-09-13 ENCOUNTER — Other Ambulatory Visit: Payer: Self-pay | Admitting: Pulmonary Disease

## 2022-09-30 ENCOUNTER — Telehealth: Payer: Self-pay | Admitting: Pharmacist

## 2022-09-30 ENCOUNTER — Encounter (HOSPITAL_BASED_OUTPATIENT_CLINIC_OR_DEPARTMENT_OTHER): Payer: Self-pay | Admitting: Pulmonary Disease

## 2022-09-30 DIAGNOSIS — J4550 Severe persistent asthma, uncomplicated: Secondary | ICD-10-CM

## 2022-09-30 MED ORDER — FASENRA PEN 30 MG/ML ~~LOC~~ SOAJ: SUBCUTANEOUS | 1 refills | Status: DC

## 2022-09-30 NOTE — Telephone Encounter (Signed)
Attempted to call pt on number provided in chart. Call could not be completed as dialed. LMTCB with Dominica Severin (spouse/emergency contact). MyChart message sent to Val[place] her that her number is incorrect in the chart and to please call [facility] to schedule f/u.

## 2022-09-30 NOTE — Telephone Encounter (Signed)
Will forward to pharmacy to advise on refill. Thanks.

## 2022-09-30 NOTE — Telephone Encounter (Signed)
Per front staff, "Attempted to call pt on number provided in chart. Call could not be completed as dialed. LMTCB with Dominica Severin (spouse/emergency contact). MyChart message sent to [place] her that her number is incorrect in the chart and to please call [facility] to schedule f/u.

## 2022-09-30 NOTE — Telephone Encounter (Signed)
Refill sent for [facility] to [facility]: [phone]  Dose: 30 mg SQ every 8 weeks  Last OV: 03/07/2022 Provider: Dr. Loanne Drilling  Next OV: 6 months (not scheduled and now overdue)  Knox Saliva, PharmD, MPH, BCPS Clinical Pharmacist (Rheumatology and Pulmonology)

## 2022-10-01 ENCOUNTER — Encounter (HOSPITAL_BASED_OUTPATIENT_CLINIC_OR_DEPARTMENT_OTHER): Payer: Self-pay | Admitting: Pulmonary Disease

## 2022-10-01 DIAGNOSIS — J4550 Severe persistent asthma, uncomplicated: Secondary | ICD-10-CM

## 2022-10-02 NOTE — Telephone Encounter (Signed)
Agree. Please send order for CPAP supplies as listed.

## 2022-10-03 NOTE — Telephone Encounter (Signed)
Patient scheduled with JE on 10/16/2022 by front staff.

## 2022-10-14 ENCOUNTER — Telehealth: Payer: Self-pay | Admitting: Pharmacist

## 2022-10-14 NOTE — Telephone Encounter (Signed)
Received fax from [facility] for [place] qty exception request(?). Submitted an URGENT Prior Authorization request to [facility] for [facility] via CoverMyMeds. Will update once we receive a response.  Key: [id number]  Knox Saliva, PharmD, MPH, BCPS, CPP Clinical Pharmacist (Rheumatology and Pulmonology)

## 2022-10-15 NOTE — Telephone Encounter (Signed)
Per response, active authorization for Berna Bue is already on file from 10/10/2022 through 10/09/2023 (from [place]: [id number])  Knox Saliva, PharmD, MPH, BCPS, CPP Clinical Pharmacist (Rheumatology and Pulmonology)

## 2022-10-16 ENCOUNTER — Encounter (HOSPITAL_BASED_OUTPATIENT_CLINIC_OR_DEPARTMENT_OTHER): Payer: Self-pay | Admitting: Pulmonary Disease

## 2022-10-16 ENCOUNTER — Ambulatory Visit (INDEPENDENT_AMBULATORY_CARE_PROVIDER_SITE_OTHER): Payer: BC Managed Care – PPO | Admitting: Pulmonary Disease

## 2022-10-16 VITALS — BP 118/70 | HR 90 | Ht 62.0 in | Wt 231.0 lb

## 2022-10-16 DIAGNOSIS — G4733 Obstructive sleep apnea (adult) (pediatric): Secondary | ICD-10-CM

## 2022-10-16 DIAGNOSIS — J4551 Severe persistent asthma with (acute) exacerbation: Secondary | ICD-10-CM

## 2022-10-16 DIAGNOSIS — J302 Other seasonal allergic rhinitis: Secondary | ICD-10-CM | POA: Diagnosis not present

## 2022-10-16 MED ORDER — AIRSUPRA 90-80 MCG/ACT IN AERO: 2.0000 | INHALATION_SPRAY | RESPIRATORY_TRACT | 2 refills | Status: DC | PRN

## 2022-10-16 MED ORDER — PREDNISONE 10 MG PO TABS: ORAL_TABLET | ORAL | 0 refills | Status: AC

## 2022-10-16 MED ORDER — BREZTRI AEROSPHERE 160-9-4.8 MCG/ACT IN AERO: 2.0000 | INHALATION_SPRAY | Freq: Two times a day (BID) | RESPIRATORY_TRACT | 6 refills | Status: DC

## 2022-10-16 MED ORDER — ALBUTEROL SULFATE HFA 108 (90 BASE) MCG/ACT IN AERS: INHALATION_SPRAY | RESPIRATORY_TRACT | 3 refills | Status: AC

## 2022-10-16 NOTE — Progress Notes (Signed)
Patient ID: Jane Bennett, female    DOB: April 16, 1966, 57 y.o.   MRN: [id number]  Chief Complaint  Patient presents with   Follow-up    Upper resp infection with asthma, still wheezing and coughing    HPI:  Ms. 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   She presents for asthma follow-up. Started Fasenra May/June 2022. Has baseline inspiratory are expiratory wheeze and chronic cough. Last flare 08/2021 when she had covid. 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 Usually triggered by heat, humidity, allergens and illness.  10/16/22 One month ago she was diagnosed with influenza and treated with supportive care and steroid shot. She was seen again for persistent cough, shortness of breath and wheezing and given additional prednisone taper which she has now completed. Using nebs less now and only needing rescue inhaler 3-4 times daily. Overall her asthma symptoms seem controlled on Fasenra but has noticed more breakthrough symptoms prior to needing her next injection. Feels like her allergy medicine not improving. Compliant with CPAP nightly.   Questionaires / Pulmonary Flowsheets:   ACT:  Asthma Control Test ACT Total Score  10/16/2022  8:32 AM 8  03/07/2022  8:45 AM 7  11/07/2021  8:45 AM 8   2021 Jan Feb Mar April May June July Aug Sept Oct Nov Dec      X  X    X    2022 Jan Feb Mar April May June July Aug Sept Oct Nov Dec    X   O         2023 Jan Feb Mar April May June July Aug Sept Oct Nov Dec        X   X   X  2024 Jan Feb Mar April May June July Aug Sept Oct Nov Dec   X              O = Jane Bennett   Past Medical History:  Diagnosis Date   Asthma    DVT (deep venous thrombosis) (HCC)    High  cholesterol    Hypertension    Pneumonia     Outpatient Encounter Medications as of 10/16/2022  Medication Sig   albuterol (PROVENTIL) (2.5 MG/3ML) 0.083% nebulizer solution Take 3 mLs (2.5 mg total) by nebulization every 6 (six) hours as needed for wheezing or shortness of breath.   Albuterol-Budesonide (AIRSUPRA) 90-80 MCG/ACT AERO Inhale 2 puffs into the lungs every 4 (four) hours as needed.   Benralizumab (FASENRA PEN) 30 MG/ML SOAJ INJECT 1 ML (30 MG TOTAL) UNDER THE SKIN EVERY 8 WEEKS   cetirizine (EQ ALLERGY RELIEF, CETIRIZINE,) 10 MG tablet Take 1 tablet by mouth once daily   EPINEPHrine 0.3 mg/0.3 mL IJ SOAJ injection SMARTSIG:1 Pre-Filled Pen Syringe IM Once PRN   famotidine (PEPCID) 40 MG tablet Take 1 tablet (40 mg total) by mouth at bedtime.   fluticasone (FLONASE) 50 MCG/ACT nasal spray Place 1 spray into both nostrils daily.   gabapentin (NEURONTIN) 300 MG capsule Take 300 mg by mouth 3 (three) times daily.   ipratropium (ATROVENT) 0.06 % nasal spray USE 2 SPRAY(S) IN EACH NOSTRIL IN THE MORNING AND AT BEDTIME   losartan-hydrochlorothiazide (HYZAAR) 50-12.5 MG tablet Take  1 tablet by mouth daily.   Omega-3 Fatty Acids (FISH OIL) 1000 MG CAPS Take by mouth.   omeprazole (PRILOSEC) 40 MG capsule Take 1 capsule by mouth once daily   ondansetron (ZOFRAN-ODT) 8 MG disintegrating tablet Take 8 mg by mouth every 6 (six) hours as needed.   predniSONE (DELTASONE) 10 MG tablet Take 4 tablets (40 mg total) by mouth daily with breakfast for 2 days, THEN 3 tablets (30 mg total) daily with breakfast for 2 days, THEN 2 tablets (20 mg total) daily with breakfast for 2 days, THEN 1 tablet (10 mg total) daily with breakfast for 2 days.   pseudoephedrine (SUDAFED) 60 MG tablet Take 1 tablet (60 mg total) by mouth every 6 (six) hours as needed for congestion.   rosuvastatin (CRESTOR) 10 MG tablet SMARTSIG:1 Tablet(s) By Mouth Every Evening   [DISCONTINUED] albuterol (VENTOLIN HFA) 108 (90 Base)  MCG/ACT inhaler INHALE 1 TO 2 PUFFS BY MOUTH EVERY 4 TO 6 HOURS   [DISCONTINUED] Budeson-Glycopyrrol-Formoterol (BREZTRI AEROSPHERE) 160-9-4.8 MCG/ACT AERO Inhale 2 puffs into the lungs in the morning and at bedtime.   albuterol (VENTOLIN HFA) 108 (90 Base) MCG/ACT inhaler INHALE 1 TO 2 PUFFS BY MOUTH EVERY 4 TO 6 HOURS   Budeson-Glycopyrrol-Formoterol (BREZTRI AEROSPHERE) 160-9-4.8 MCG/ACT AERO Inhale 2 puffs into the lungs in the morning and at bedtime.   No facility-administered encounter medications on file as of 10/16/2022.     Review of Systems Review of Systems  Constitutional:  Negative for chills, diaphoresis, fever, malaise/fatigue and weight loss.  HENT:  Negative for congestion.   Respiratory:  Positive for cough and shortness of breath. Negative for hemoptysis, sputum production and wheezing.   Cardiovascular:  Negative for chest pain, palpitations and leg swelling.     BP 118/70 (BP Location: Right Arm, Cuff Size: Large)   Pulse 90   Ht 5\' 2"  (1.575 m)   Wt 231 lb (104.8 kg)   SpO2 98%   BMI 42.25 kg/m   Physical Exam: General: Well-appearing, no acute distress HENT: [place], AT Eyes: EOMI, no scleral icterus Respiratory: Clear to auscultation bilaterally.  No crackles, wheezing or rales Cardiovascular: RRR, -M/R/G, no JVD Extremities:-Edema,-tenderness Neuro: AAO x4, CNII-XII grossly intact Psych: Normal mood, normal affect   Data Reviewed:  Imaging: CTA 10/11/19 - No pulmonary embolism. Normal parenchyma, no effusion or edema HRCT at [facility] 03/30/2020 (report only) - No evidence of interstitial lung disease. Mild diffuse central airway thickening which can be seen with asthma or bronchitis  PFTs: 01/06/2020 FVC 1.27 [39%], FEV1 1.06 [41%], F/F 84 TLC 3.97 [83%], DLCO 20.20 [104%] Interpretation: Normal spirometry however significant bronchodilator response with post-bronchodilator FEV1 increased by 25% and increased small airway disease response. No evidence of  restrictive lung disease. Normal gas exchange  Labs: IgE 01/06/2020- 18 CBC 10/11/2019-WBC 8.7, eos 2%, absolute eosinophil count 200  09/15/2019-SARS-CoV-2-positive  05/30/2020-home sleep study-AHI 8.5, SaO2 low 75%  CPAP Compliance Report 07/18/22-10/15/22 Usage 100% >4 hours 100% AHI 0.9   Assessment & Plan:  57 year old female never smoker with asthma and vocal cord dysfunction and chronic cough secondary to COVID-19 long hauler syndrome who presents for follow-up. In mild exacerbation. Discussed clinical course and management of asthma including bronchodilator regimen and action plan for exacerbation.  Severe eosinophilia persistent asthma --CONTINUE Fasenra as scheduled. May need to consider new biologic --CONTINUE Breztri 160-9-4.5 mcg TWO puffs TWICE daily with aerochamber. REFILL --START Airsupra TWO puffs every 4 hours AS NEEDED for shortness of breath or  wheezing. Use coupon provided --Prescribed Albuterol AS NEEDED for shortness of breath or wheezing if unable to fill Airsupra --Prescribed prednisone taper if unable to fill Airsupra  Hoarseness - improved  Vocal cord dysfunction secondary to COVID-19 long hauler - improved --Discharged from [facility] with Dr. Rubye Oaks: Last seen 03/26/22. No benefit from botox. On gabapentin and PPI. --Continue home exercises  Allergic Rhinitis --CONTINUE atrovent nasal spray daily --CONTINUE Flonase nightly --CONTINUE cetirizine 10 mg daily. Ok to change to zyrtec or allegra if not effective --CONTINUE Pseudoephedrine 60 mg every 6 hours as needed. Caution in setting of hypertension  --Continue saline rinses, nettie pot --Minimize outdoor exposure as much as possible  OSA --Reviewed CPAP compliance report. --Patient uses NIV for more then 4 hours per night on 70% of nights during the last three months of usage. --Counseled on sleep hygiene --Counseled on weight loss/maintenance of healthy weight --Counseled NOT to drive if/when  sleepy    Health Maintenance Immunization History  Administered Date(s) Administered   Influenza Whole 07/04/2019, 07/26/2020   No orders of the defined types were placed in this encounter.   Meds ordered this encounter  Medications   Albuterol-Budesonide (AIRSUPRA) 90-80 MCG/ACT AERO    Sig: Inhale 2 puffs into the lungs every 4 (four) hours as needed.    Dispense:  10.7 g    Refill:  2   predniSONE (DELTASONE) 10 MG tablet    Sig: Take 4 tablets (40 mg total) by mouth daily with breakfast for 2 days, THEN 3 tablets (30 mg total) daily with breakfast for 2 days, THEN 2 tablets (20 mg total) daily with breakfast for 2 days, THEN 1 tablet (10 mg total) daily with breakfast for 2 days.    Dispense:  20 tablet    Refill:  0   Budeson-Glycopyrrol-Formoterol (BREZTRI AEROSPHERE) 160-9-4.8 MCG/ACT AERO    Sig: Inhale 2 puffs into the lungs in the morning and at bedtime.    Dispense:  10.7 g    Refill:  6    Order Specific Question:   Lot Number?    Answer:   [id number]    Order Specific Question:   Expiration Date?    Answer:   03/22/2022    Order Specific Question:   Manufacturer?    Answer:   [employer] [[id number]]    Order Specific Question:   Quantity    Answer:   4   albuterol (VENTOLIN HFA) 108 (90 Base) MCG/ACT inhaler    Sig: INHALE 1 TO 2 PUFFS BY MOUTH EVERY 4 TO 6 HOURS    Dispense:  18 g    Refill:  3   Return in about 6 months (around 04/16/2023).  I have spent a total time of 35-minutes on the day of the appointment including chart review, data review, collecting history, coordinating care and discussing medical diagnosis and plan with the patient/family. Past medical history, allergies, medications were reviewed. Pertinent imaging, labs and tests included in this note have been reviewed and interpreted independently by me.  Mechele Collin, MD [facility] Pulmonary/Critical Care Medicine 10/16/2022 8:59 AM

## 2022-10-16 NOTE — Patient Instructions (Addendum)
Severe eosinophilia persistent asthma --CONTINUE Fasenra as scheduled --CONTINUE Breztri 160-9-4.5 mcg TWO puffs TWICE daily with aerochamber. REFILL --START Airsupra TWO puffs every 4 hours AS NEEDED for shortness of breath or wheezing. Use coupon provided --Prescribed Albuterol AS NEEDED for shortness of breath or wheezing if unable to fill Airsupra --Prescribed prednisone taper if unable to fill Airsupra  Hoarseness - improved  Vocal cord dysfunction secondary to COVID-19 long hauler - improved --Discharged from [facility] with Dr. Rowe Clack: Last seen 03/26/22. No benefit from botox. On gabapentin and PPI. --Continue home exercises  Allergic Rhinitis --CONTINUE atrovent nasal spray daily --CONTINUE Flonase nightly --CONTINUE cetirizine 10 mg daily. Ok to change to zyrtec or allegra if not effective --CONTINUE Pseudoephedrine 60 mg every 6 hours as needed. Caution in setting of hypertension  --Continue saline rinses, nettie pot --Minimize outdoor exposure as much as possible  OSA --Reviewed CPAP compliance report. --Patient uses NIV for more then 4 hours per night on 70% of nights during the last three months of usage. --Counseled on sleep hygiene --Counseled on weight loss/maintenance of healthy weight --Counseled NOT to drive if/when sleepy  Follow-up with me in 6 months

## 2022-10-19 ENCOUNTER — Encounter (HOSPITAL_BASED_OUTPATIENT_CLINIC_OR_DEPARTMENT_OTHER): Payer: Self-pay | Admitting: Pulmonary Disease

## 2022-10-20 MED ORDER — SPACER/AERO-HOLD CHAMBER BAGS MISC: 1.0000 | Freq: Every day | 0 refills | Status: DC | PRN

## 2022-10-28 ENCOUNTER — Telehealth: Payer: Self-pay | Admitting: Pulmonary Disease

## 2022-10-29 ENCOUNTER — Other Ambulatory Visit (HOSPITAL_COMMUNITY): Payer: Self-pay

## 2022-10-29 NOTE — Telephone Encounter (Signed)
Called [facility] and advised that they are getting a "PA Required" rejection because they are incorrectly attempting to bill this medication as a 30 day supply rather than the 56 day supply that they have been historically filling for the pt for over a year now (see below). After multiple calls to [facility] I finally was able to talk to someone who was able to understand what needed to be done and she stated that she has put in a request to have the incorrect claim cleared out of their system so that it can be resubmitted correctly once again.     Nothing further should be required at this time.

## 2022-10-30 NOTE — Telephone Encounter (Signed)
Called [facility] for update on patient's Fasenra order. Per rep, still getting rejection. I've requested process ot be expedited as pt is overdue. PA approval uploaded into [facility] portal for review.  Knox Saliva, PharmD, MPH, BCPS, CPP Clinical Pharmacist (Rheumatology and Pulmonology)

## 2022-11-03 NOTE — Telephone Encounter (Signed)
Per [facility] portal, patient's Jane Bennett is still waiting for PA. Called [facility] for update. Per rep, she has pushed to "back end". Turnaround time is 24-48 hours  Knox Saliva, PharmD, MPH, BCPS, CPP Clinical Pharmacist (Rheumatology and Pulmonology)

## 2022-11-07 NOTE — Telephone Encounter (Signed)
Per [facility] portal, Berna Bue has shipped from facility  Knox Saliva, PharmD, MPH, BCPS, CPP Clinical Pharmacist (Rheumatology and Pulmonology)

## 2022-11-14 ENCOUNTER — Other Ambulatory Visit (HOSPITAL_BASED_OUTPATIENT_CLINIC_OR_DEPARTMENT_OTHER): Payer: Self-pay | Admitting: Pulmonary Disease

## 2022-12-06 ENCOUNTER — Encounter (HOSPITAL_BASED_OUTPATIENT_CLINIC_OR_DEPARTMENT_OTHER): Payer: Self-pay | Admitting: Pulmonary Disease

## 2022-12-08 MED ORDER — EPINEPHRINE 0.3 MG/0.3ML IJ SOAJ: 0.3000 mg | INTRAMUSCULAR | 1 refills | Status: DC | PRN

## 2022-12-08 NOTE — Telephone Encounter (Signed)
Epi pen prescribed with one refill.

## 2022-12-08 NOTE — Telephone Encounter (Signed)
Please advise 

## 2023-01-21 ENCOUNTER — Other Ambulatory Visit: Payer: Self-pay | Admitting: Pharmacist

## 2023-01-21 DIAGNOSIS — J4550 Severe persistent asthma, uncomplicated: Secondary | ICD-10-CM

## 2023-01-21 MED ORDER — FASENRA PEN 30 MG/ML ~~LOC~~ SOAJ: SUBCUTANEOUS | 1 refills | Status: DC

## 2023-01-22 ENCOUNTER — Other Ambulatory Visit: Payer: Self-pay | Admitting: Pulmonary Disease

## 2023-01-22 DIAGNOSIS — J383 Other diseases of vocal cords: Secondary | ICD-10-CM

## 2023-01-22 DIAGNOSIS — K219 Gastro-esophageal reflux disease without esophagitis: Secondary | ICD-10-CM

## 2023-01-25 ENCOUNTER — Other Ambulatory Visit: Payer: Self-pay | Admitting: Pulmonary Disease

## 2023-01-25 DIAGNOSIS — J302 Other seasonal allergic rhinitis: Secondary | ICD-10-CM

## 2023-01-30 ENCOUNTER — Other Ambulatory Visit (HOSPITAL_BASED_OUTPATIENT_CLINIC_OR_DEPARTMENT_OTHER): Payer: Self-pay | Admitting: Pulmonary Disease

## 2023-02-26 ENCOUNTER — Other Ambulatory Visit (HOSPITAL_BASED_OUTPATIENT_CLINIC_OR_DEPARTMENT_OTHER): Payer: Self-pay | Admitting: Pulmonary Disease

## 2023-04-04 ENCOUNTER — Other Ambulatory Visit: Payer: Self-pay | Admitting: Pulmonary Disease

## 2023-04-04 DIAGNOSIS — J383 Other diseases of vocal cords: Secondary | ICD-10-CM

## 2023-04-04 DIAGNOSIS — J302 Other seasonal allergic rhinitis: Secondary | ICD-10-CM

## 2023-04-04 DIAGNOSIS — K219 Gastro-esophageal reflux disease without esophagitis: Secondary | ICD-10-CM

## 2023-04-25 ENCOUNTER — Encounter (HOSPITAL_BASED_OUTPATIENT_CLINIC_OR_DEPARTMENT_OTHER): Payer: Self-pay | Admitting: Pulmonary Disease

## 2023-05-20 ENCOUNTER — Encounter (HOSPITAL_BASED_OUTPATIENT_CLINIC_OR_DEPARTMENT_OTHER): Payer: Self-pay | Admitting: Pulmonary Disease

## 2023-05-22 ENCOUNTER — Other Ambulatory Visit: Payer: Self-pay | Admitting: Pulmonary Disease

## 2023-05-22 DIAGNOSIS — J4550 Severe persistent asthma, uncomplicated: Secondary | ICD-10-CM

## 2023-06-02 NOTE — Telephone Encounter (Signed)
Will send further refill after OV  Chesley Mires, PharmD, MPH, BCPS Clinical Pharmacist (Rheumatology and Pulmonology)

## 2023-06-16 ENCOUNTER — Other Ambulatory Visit (HOSPITAL_BASED_OUTPATIENT_CLINIC_OR_DEPARTMENT_OTHER): Payer: Self-pay | Admitting: Pulmonary Disease

## 2023-06-16 ENCOUNTER — Encounter (HOSPITAL_BASED_OUTPATIENT_CLINIC_OR_DEPARTMENT_OTHER): Payer: Self-pay | Admitting: Pulmonary Disease

## 2023-06-16 ENCOUNTER — Ambulatory Visit (HOSPITAL_BASED_OUTPATIENT_CLINIC_OR_DEPARTMENT_OTHER): Payer: BC Managed Care – PPO | Admitting: Pulmonary Disease

## 2023-06-16 ENCOUNTER — Telehealth: Payer: Self-pay | Admitting: Pharmacist

## 2023-06-16 VITALS — BP 122/70 | HR 64 | Resp 18 | Ht 62.0 in | Wt 198.3 lb

## 2023-06-16 DIAGNOSIS — J4550 Severe persistent asthma, uncomplicated: Secondary | ICD-10-CM

## 2023-06-16 MED ORDER — EQ SPACE CHAMBER ANTI-STATIC DEVI: 1.0000 | 0 refills | Status: AC | PRN

## 2023-06-16 MED ORDER — BREZTRI AEROSPHERE 160-9-4.8 MCG/ACT IN AERO: 2.0000 | INHALATION_SPRAY | Freq: Two times a day (BID) | RESPIRATORY_TRACT | 6 refills | Status: DC

## 2023-06-16 NOTE — Progress Notes (Addendum)
Patient ID: Jane Bennett, female    DOB: December 16, 1965, 57 y.o.   MRN: [id number]  Chief Complaint  Patient presents with   Follow-up    OSA, asthma    HPI:  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   Started Harrington Challenger May/June 2022. Has baseline inspiratory are expiratory wheeze and chronic cough. Last flare 08/2021 when she had covid. 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 Usually triggered by heat, humidity, allergens and illness.  10/16/22 One month ago she was diagnosed with influenza and treated with supportive care and steroid shot. She was seen again for persistent cough, shortness of breath and wheezing and given additional prednisone taper which she has now completed. Using nebs less now and only needing rescue inhaler 3-4 times daily. Overall her asthma symptoms seem controlled on Fasenra but has noticed more breakthrough symptoms prior to needing her next injection. Feels like her allergy medicine not improving. Compliant with CPAP nightly.   06/16/23 Since our last visit she has intentionally lost 40 lbs in the last 8 months. She reports her breathing has improved since then but does have some shortness of breath with activity. No dedicated exercise time but does short intervals of 10-30 min of housework, walking to chicken coop or going to the store or general walking. She can feel the Harrington Challenger is not as effective near the end of treatment. She reports night awakenings twice a night with productive cough associated with wheezing. Still uses albuterol daily despite Breztri. Has not needed nebulizers. Has congestion seasonally that always worsens in the fall. She is compliant with CPAP nightly and helps  with her quality of sleep.  Questionaires / Pulmonary Flowsheets:   ACT:  Asthma Control Test ACT Total Score  06/16/2023  8:32 AM 10  10/16/2022  8:32 AM 8  03/07/2022  8:45 AM 7   2021 Jan Feb Mar April May June July Aug Sept Oct Nov Dec      X  X    X    2022 Jan Feb Mar April May June July Aug Sept Oct Nov Dec    X   O         2023 Jan Feb Mar April May June July Aug Sept Oct Nov Dec        X   X   X  2024 Jan Feb Mar April May June July Aug Sept Oct Nov Dec   X              O = Larence Penning 01/16/21   Past Medical History:  Diagnosis Date   Asthma    DVT (deep venous thrombosis) (HCC)    High cholesterol    Hypertension    Pneumonia     Outpatient Encounter Medications as of 06/16/2023  Medication Sig   albuterol (PROVENTIL) (2.5 MG/3ML) 0.083% nebulizer solution Take 3 mLs (2.5 mg total) by nebulization every 6 (six) hours as needed for wheezing or shortness of breath.   albuterol (VENTOLIN HFA) 108 (90 Base) MCG/ACT inhaler INHALE 1 TO 2 PUFFS BY MOUTH EVERY 4 TO 6 HOURS   Albuterol-Budesonide (AIRSUPRA) 90-80 MCG/ACT AERO INHALE 2 PUFFS EVERY 4 HOURS AS NEEDED   benralizumab (  FASENRA PEN) 30 MG/ML prefilled autoinjector INJECT 30 MG (1 ML) UNDER THE SKIN EVERY 8 WEEKS **appt needed for further refills**   EPINEPHrine 0.3 mg/0.3 mL IJ SOAJ injection Inject 0.3 mg into the muscle as needed for anaphylaxis.   EQ ALLERGY RELIEF, CETIRIZINE, 10 MG tablet Take 1 tablet by mouth once daily   famotidine (PEPCID) 40 MG tablet Take 1 tablet (40 mg total) by mouth at bedtime.   fluticasone (FLONASE) 50 MCG/ACT nasal spray Place 1 spray into both nostrils daily.   gabapentin (NEURONTIN) 300 MG capsule Take 300 mg by mouth 3 (three) times daily.   ipratropium (ATROVENT) 0.06 % nasal spray USE 2 SPRAY(S) IN EACH NOSTRIL IN THE MORNING AND AT BEDTIME   losartan-hydrochlorothiazide (HYZAAR) 50-12.5 MG tablet Take 1 tablet by mouth daily.   Omega-3 Fatty Acids (FISH OIL) 1000 MG  CAPS Take by mouth.   omeprazole (PRILOSEC) 40 MG capsule Take 1 capsule by mouth once daily   ondansetron (ZOFRAN-ODT) 8 MG disintegrating tablet Take 8 mg by mouth every 6 (six) hours as needed.   pseudoephedrine (SUDAFED) 60 MG tablet Take 1 tablet (60 mg total) by mouth every 6 (six) hours as needed for congestion.   rosuvastatin (CRESTOR) 10 MG tablet SMARTSIG:1 Tablet(s) By Mouth Every Evening   Spacer/Aero-Holding Chambers (EQ SPACE CHAMBER ANTI-STATIC) DEVI USE AS DIRECTED ONCE DAILY AS NEEDED   [DISCONTINUED] Budeson-Glycopyrrol-Formoterol (BREZTRI AEROSPHERE) 160-9-4.8 MCG/ACT AERO Inhale 2 puffs into the lungs in the morning and at bedtime.   Budeson-Glycopyrrol-Formoterol (BREZTRI AEROSPHERE) 160-9-4.8 MCG/ACT AERO Inhale 2 puffs into the lungs in the morning and at bedtime.   No facility-administered encounter medications on file as of 06/16/2023.     Review of Systems Review of Systems  Constitutional:  Negative for chills, diaphoresis, fever, malaise/fatigue and weight loss.  HENT:  Negative for congestion.   Respiratory:  Positive for cough, sputum production, shortness of breath and wheezing. Negative for hemoptysis.   Cardiovascular:  Negative for chest pain, palpitations and leg swelling.    BP 122/70   Pulse 64   Resp 18   Ht 5\' 2"  (1.575 m)   Wt 198 lb 4.8 oz (89.9 kg)   SpO2 98%   BMI 36.27 kg/m   Physical Exam: General: Well-appearing, no acute distress HENT: [place], AT Eyes: EOMI, no scleral icterus Respiratory: Upper airway wheezing. Clear to auscultation bilaterally.  No crackles, wheezing or rales Cardiovascular: RRR, -M/R/G, no JVD Extremities:-Edema,-tenderness Neuro: AAO x4, CNII-XII grossly intact Psych: Normal mood, normal affect  Data Reviewed:  Imaging: CTA 10/11/19 - No pulmonary embolism. Normal parenchyma, no effusion or edema HRCT at [facility] 03/30/2020 (report only) - No evidence of interstitial lung disease. Mild diffuse central airway  thickening which can be seen with asthma or bronchitis  PFTs: 01/06/2020 FVC 1.27 [39%], FEV1 1.06 [41%], F/F 84 TLC 3.97 [83%], DLCO 20.20 [104%] Interpretation: Normal spirometry however significant bronchodilator response with post-bronchodilator FEV1 increased by 25% and increased small airway disease response. No evidence of restrictive lung disease. Normal gas exchange  Labs: IgE 01/06/2020- 18 CBC 10/11/2019-WBC 8.7, eos 2%, absolute eosinophil count 200  09/15/2019-SARS-CoV-2-positive  05/30/2020-home sleep study-AHI 8.5, SaO2 low 75%  CPAP Compliance Report 07/18/22-10/15/22 Usage 100% >4 hours 100% AHI 0.9  CPAP Compliance Report 03/18/23-06/15/23 Usage days 90/90 days (100%) >4 hours 89 days (99%) Avg usage 7h 53 min Auto CPAP 5-15, EPR 3 AHI 1.2   Assessment & Plan:  57 year old female never smoker with asthma and hx  of vocal cord dysfunction 2/2 covid-19 long hauler syndrome now resolved who presents for asthma follow-up. Last exacerbation in Jan 2024. Despite no exacerbations she continues to have uncontrolled asthma with nightly awakenings and daily albuterol use during day and night. Discussed clinical course and management of asthma including bronchodilator regimen, preventive care and action plan for exacerbation.  Severe eosinophilia persistent asthma --CONTINUE Fasenra as scheduled. Will transition to [place] and coordinate with pharmacy team. --Edwina Barth 160-9-4.5 mcg TWO puffs TWICE daily with aerochamber. REFILL --CONTINUE Albuterol TWO puffs every 4 hours AS NEEDED for shortness of breath or wheezing --Obtain labs: CBC with diff and IgE. Please got to lab on the 3rd floor  Allergic Rhinitis --CONTINUE atrovent nasal spray daily --CONTINUE Flonase nightly --CONTINUE cetirizine 10 mg daily. Ok to change to zyrtec or allegra if not effective --CONTINUE Pseudoephedrine 60 mg every 6 hours as needed. Caution in setting of hypertension  --Continue saline  rinses, nettie pot --Minimize outdoor exposure as much as possible  OSA --Reviewed CPAP compliance report --Patient uses NIV for more than four hours nightly for at least 70% of nights during the last three months of usage. The patient has been using and benefiting from PAP use and will continue to benefit from therapy.  --Counseled on sleep hygiene --Counseled on weight loss/maintenance of healthy weight --Counseled NOT to drive if/when sleepy    Health Maintenance Immunization History  Administered Date(s) Administered   Influenza Whole 07/04/2019, 07/26/2020   Orders Placed This Encounter  Procedures   CBC With Differential   IgE    Meds ordered this encounter  Medications   Budeson-Glycopyrrol-Formoterol (BREZTRI AEROSPHERE) 160-9-4.8 MCG/ACT AERO    Sig: Inhale 2 puffs into the lungs in the morning and at bedtime.    Dispense:  10.7 g    Refill:  6    Order Specific Question:   Lot Number?    Answer:   [id number]    Order Specific Question:   Expiration Date?    Answer:   03/22/2022    Order Specific Question:   Manufacturer?    Answer:   [employer]stra[employer] [[id number]]    Order Specific Question:   Quantity    Answer:   4   Return in about 6 months (around 12/14/2023).  I have spent a total time of 45-minutes on the day of the appointment including chart review, data review, collecting history, coordinating care and discussing medical diagnosis and plan with the patient/family. Past medical history, allergies, medications were reviewed. Pertinent imaging, labs and tests included in this note have been reviewed and interpreted independently by me.  Mechele Collin, MD [facility] Pulmonary/Critical Care Medicine 06/16/2023 8:45 AM

## 2023-06-16 NOTE — Patient Instructions (Signed)
Severe eosinophilia persistent asthma --CONTINUE Fasenra as scheduled. Will transition to [place] --CONTINUE Breztri 160-9-4.5 mcg TWO puffs TWICE daily with aerochamber. REFILL --CONTINUE Albuterol TWO puffs every 4 hours AS NEEDED for shortness of breath or wheezing --Obtain labs: CBC with diff and IgE. Please got to lab on the 3rd floor  Allergic Rhinitis --CONTINUE atrovent nasal spray daily --CONTINUE Flonase nightly --CONTINUE cetirizine 10 mg daily. Ok to change to zyrtec or allegra if not effective --CONTINUE Pseudoephedrine 60 mg every 6 hours as needed. Caution in setting of hypertension  --Continue saline rinses, nettie pot --Minimize outdoor exposure as much as possible  OSA --Reviewed CPAP compliance report --Patient uses NIV for more than four hours nightly for at least 70% of nights during the last three months of usage. The patient has been using and benefiting from PAP use and will continue to benefit from therapy.  --Counseled on sleep hygiene --Counseled on weight loss/maintenance of healthy weight --Counseled NOT to drive if/when sleepy

## 2023-06-16 NOTE — Telephone Encounter (Signed)
Pending updated CBC w/diff please start [place]ala BIV  Dose: 100mg  SQ every 28 days  Chesley Mires, PharmD, MPH, BCPS, CPP Clinical Pharmacist (Rheumatology and Pulmonology)

## 2023-06-16 NOTE — Progress Notes (Signed)
Received [facility] [place] forms. Pharmacy team will start Nucala BIV  Chesley Mires, PharmD, MPH, BCPS, CPP Clinical Pharmacist (Rheumatology and Pulmonology)

## 2023-06-17 MED ORDER — NUCALA 100 MG/ML ~~LOC~~ SOAJ
100.0000 mg | SUBCUTANEOUS | 4 refills | Status: DC
Start: 2023-06-17 — End: 2023-08-27

## 2023-06-17 NOTE — Telephone Encounter (Signed)
Order for Nucala sent to [facility]. Closing this encounter.

## 2023-06-18 LAB — CBC WITH DIFFERENTIAL/PLATELET
Basophils Absolute: 0 10*3/uL (ref 0.0–0.2)
Basos: 0 %
EOS (ABSOLUTE): 0 10*3/uL (ref 0.0–0.4)
Eos: 0 %
Hematocrit: 42.1 % (ref 34.0–46.6)
Hemoglobin: 13.2 g/dL (ref 11.1–15.9)
Immature Grans (Abs): 0 10*3/uL (ref 0.0–0.1)
Immature Granulocytes: 0 %
Lymphocytes Absolute: 2.9 10*3/uL (ref 0.7–3.1)
Lymphs: 39 %
MCH: 28.8 pg (ref 26.6–33.0)
MCHC: 31.4 g/dL — ABNORMAL LOW (ref 31.5–35.7)
MCV: 92 fL (ref 79–97)
Monocytes Absolute: 0.4 10*3/uL (ref 0.1–0.9)
Monocytes: 5 %
Neutrophils Absolute: 4.1 10*3/uL (ref 1.4–7.0)
Neutrophils: 56 %
Platelets: 210 10*3/uL (ref 150–450)
RBC: 4.59 x10E6/uL (ref 3.77–5.28)
RDW: 14 % (ref 11.7–15.4)
WBC: 7.5 10*3/uL (ref 3.4–10.8)

## 2023-06-18 LAB — IGE: IgE (Immunoglobulin E), Serum: 12 IU/mL (ref 6–495)

## 2023-06-19 ENCOUNTER — Other Ambulatory Visit: Payer: Self-pay | Admitting: Pharmacist

## 2023-06-19 NOTE — Progress Notes (Signed)
Submitted a Prior Authorization request to [facility] for NUCALA via CoverMyMeds. Pending clinical questions to populate  Key: [id number]

## 2023-06-19 NOTE — Progress Notes (Signed)
Clinical questions for Nucala PA completed

## 2023-06-23 ENCOUNTER — Other Ambulatory Visit (HOSPITAL_COMMUNITY): Payer: Self-pay

## 2023-06-23 NOTE — Progress Notes (Signed)
Patient states her next Harrington Challenger dose is due 07/15/23. She is scheduled for Nucala new start on 07/08/23  Chesley Mires, PharmD, MPH, BCPS, CPP Clinical Pharmacist (Rheumatology and Pulmonology)

## 2023-06-23 NOTE — Progress Notes (Signed)
Received notification from [facility] regarding a prior authorization for NUCALA. Authorization has been APPROVED from 06/19/23 to 06/18/24. Approval letter sent to scan center.  Patient must fill through [facility]: [phone]  Authorization # [id number] Phone # [phone]  Patient enrolled into [place] copay card program: ID: [id number] Group: [id number] Rx BIN: [id number] PCN: [id number]  Patient can be scheduled for Nucala new start  Chesley Mires, PharmD, MPH, BCPS, CPP Clinical Pharmacist (Rheumatology and Pulmonology)

## 2023-07-01 NOTE — Progress Notes (Unsigned)
HPI Patient presents today to [facility] to see pharmacy team for [facility] new start.  Past medical history includes OSA, HTN, GERD, and vocal cord dysfunction (s/p COVID)  Has Epipen on hand today but has never had to use. Her last dose of Harrington Challenger was on 05/20/2023 (started 01/16/2021).  Respiratory Medications Current regimen: Breztri 160-9-4.80mcg (2 puffs twice daily) Tried in past: Advair + Spiriva Patient reports no known adherence challenges  OBJECTIVE Allergies  Allergen Reactions   Other Hives   Sulfa Antibiotics Hives   Bee Venom Hives    Outpatient Encounter Medications as of 07/08/2023  Medication Sig   albuterol (PROVENTIL) (2.5 MG/3ML) 0.083% nebulizer solution Take 3 mLs (2.5 mg total) by nebulization every 6 (six) hours as needed for wheezing or shortness of breath.   albuterol (VENTOLIN HFA) 108 (90 Base) MCG/ACT inhaler INHALE 1 TO 2 PUFFS BY MOUTH EVERY 4 TO 6 HOURS   Albuterol-Budesonide (AIRSUPRA) 90-80 MCG/ACT AERO INHALE 2 PUFFS EVERY 4 HOURS AS NEEDED   benralizumab (FASENRA PEN) 30 MG/ML prefilled autoinjector INJECT 30 MG (1 ML) UNDER THE SKIN EVERY 8 WEEKS **appt needed for further refills**   Budeson-Glycopyrrol-Formoterol (BREZTRI AEROSPHERE) 160-9-4.8 MCG/ACT AERO Inhale 2 puffs into the lungs in the morning and at bedtime.   EPINEPHrine 0.3 mg/0.3 mL IJ SOAJ injection Inject 0.3 mg into the muscle as needed for anaphylaxis.   EQ ALLERGY RELIEF, CETIRIZINE, 10 MG tablet Take 1 tablet by mouth once daily   famotidine (PEPCID) 40 MG tablet Take 1 tablet (40 mg total) by mouth at bedtime.   fluticasone (FLONASE) 50 MCG/ACT nasal spray Place 1 spray into both nostrils daily.   gabapentin (NEURONTIN) 300 MG capsule Take 300 mg by mouth 3 (three) times daily.   ipratropium (ATROVENT) 0.06 % nasal spray USE 2 SPRAY(S) IN EACH NOSTRIL IN THE MORNING AND AT BEDTIME   losartan-hydrochlorothiazide (HYZAAR) 50-12.5 MG tablet Take 1 tablet by mouth daily.    Mepolizumab (NUCALA) 100 MG/ML SOAJ Inject 1 mL (100 mg total) into the skin every 28 (twenty-eight) days.   Omega-3 Fatty Acids (FISH OIL) 1000 MG CAPS Take by mouth.   omeprazole (PRILOSEC) 40 MG capsule Take 1 capsule by mouth once daily   ondansetron (ZOFRAN-ODT) 8 MG disintegrating tablet Take 8 mg by mouth every 6 (six) hours as needed.   pseudoephedrine (SUDAFED) 60 MG tablet Take 1 tablet (60 mg total) by mouth every 6 (six) hours as needed for congestion.   rosuvastatin (CRESTOR) 10 MG tablet SMARTSIG:1 Tablet(s) By Mouth Every Evening   Spacer/Aero-Holding Chambers (EQ SPACE CHAMBER ANTI-STATIC) DEVI Inhale 1 each into the lungs as needed.   No facility-administered encounter medications on file as of 07/08/2023.     Immunization History  Administered Date(s) Administered   Influenza Whole 07/04/2019, 07/26/2020     PFTs    Latest Ref Rng & Units 01/06/2020    8:36 AM  PFT Results  FVC-Pre L 1.20  P  FVC-Predicted Pre % 37  P  FVC-Post L 1.27  P  FVC-Predicted Post % 39  P  Pre FEV1/FVC % % 70  P  Post FEV1/FCV % % 84  P  FEV1-Pre L 0.84  P  FEV1-Predicted Pre % 33  P  FEV1-Post L 1.06  P  DLCO uncorrected ml/min/mmHg 20.20  P  DLCO UNC% % 104  P  DLCO corrected ml/min/mmHg 20.20  P  DLCO COR %Predicted % 104  P  DLVA Predicted % 133  P  TLC L 3.97  P  TLC % Predicted % 83  P  RV % Predicted % 91  P    P Preliminary result    Eosinophils Most recent blood eosinophil count was <50 cells/microL taken on 06/16/23 likely due to Pence use; previously 200 on 10/11/2019  IgE: 12 on 06/16/2023   Assessment   Biologics training for mepolizumab (Nucala)  Goals of therapy: Mechanism of Action: Not fully understood. It does act an interleukin-5 (IL-5) antagonist monoclonal antibody that reduces the production and survival of eosinophils by blocking the binding of IL-5 to the alpha chain of the receptor complex on the eosinophil cell surface. Reviewed that Nucala is  add-on medication and patient must continue maintenance inhaler regimen. Response to therapy: may take 3 months to 6 months to determine efficacy. Discussed that patients generally feel improvement sooner than 3 months.  Side effects: headache (19%), injection site reaction (7-15%), antibody development (6%), backache (5%), fatigue (5%)  Dose: 100 mg subcutaneously every 4 weeks  Administration/Storage:  Reviewed administration sites of thigh or abdomen (at least 2-3 inches away from abdomen). Reviewed the upper arm is only appropriate if caregiver is administering injection  Do not shake the reconstituted solution as this could lead to product foaming or precipitation. Solution should be clear to opalescent and colorless to pale yellow or pale brown, essentially particle free. Small air bubbles, however, are expected and acceptable. If particulate matter remains in the solution or if the solution appears cloudy or milky, discard the solution.  Reviewed storage of medication in refrigerator. Reviewed that Nucala can be stored at room temperature in unopened carton for up to 7 days.  Access: Approval of Nucala through: insurance Patient enrolled into copay card program to help with copay assistance.  Patient self-administered Nucala 100mg /mL in left lower abdomen using sample Nucala 100mg /mL autoinjector pen NDC: [id number] Lot: [id number] Expiration: 05/2025  Patient monitored for 30 minutes for adverse reaction.  Patient tolerated well.  Injection site noted. Patient denies itchiness and irritation at injection., No swelling or redness noted., and Reviewed injection site reaction management with patient verbally and printed information for review in AVS  Medication Reconciliation  A drug regimen assessment was performed, including review of allergies, interactions, disease-state management, dosing and immunization history. Medications were reviewed with the patient, including name,  instructions, indication, goals of therapy, potential side effects, importance of adherence, and safe use.  Drug interaction(s): none noted   PLAN Continue Nucala 100mg  every 4 weeks. Next dose is due 08/05/2023 and every 4 weeks thereafter. Rx sent to: [facility]: [phone]. Patient provided with pharmacy phone number. She has already received shipment from pharmacy. Patient provided with copay card information to provide to pharmacy if quoted copay exceeds $5 per month. Continue maintenance asthma regimen of: Breztri 160-9-4.91mcg (2 puffs twice daily) Patient enrolled into [place] copay card  All questions encouraged and answered.  Instructed patient to reach out with any further questions or concerns.  Thank you for allowing pharmacy to participate in this patient's care.  This appointment required 40 minutes of patient care (this includes precharting, chart review, review of results, face-to-face care, etc.).   Chesley Mires, PharmD, MPH, BCPS, CPP Clinical Pharmacist (Rheumatology and Pulmonology)

## 2023-07-08 ENCOUNTER — Ambulatory Visit: Payer: BC Managed Care – PPO | Admitting: Pharmacist

## 2023-07-08 DIAGNOSIS — Z7189 Other specified counseling: Secondary | ICD-10-CM

## 2023-07-08 DIAGNOSIS — J4550 Severe persistent asthma, uncomplicated: Secondary | ICD-10-CM

## 2023-07-08 NOTE — Patient Instructions (Signed)
Your next NUCALA dose is due on 08/05/2023, 09/02/2023, and every 4 weeks thereafter  CONTINUE Breztri 2 puffs twice daily  Your prescription will be shipped from [facility]. Their phone number is [phone] Please call to schedule shipment and confirm address. They will mail your medication to your home.  Your copay should be affordable. If you call the pharmacy and it is not affordable, please double-check that they are billing through your copay card as secondary coverage. That copay card information is: ID: [id number] Group: [id number] Rx BIN: [id number] PCN: [id number]  You will need to be seen by your provider in 3 to 4 months to assess how NUCALA is working for you. Please ensure you have a follow-up appointment scheduled in January or February 2025. Call our clinic if you need to make this appointment.  How to manage an injection site reaction: Remember the 5 C's: COUNTER - leave on the counter at least 30 minutes but up to overnight to bring medication to room temperature. This may help prevent stinging COLD - place something cold (like an ice gel pack or cold water bottle) on the injection site just before cleansing with alcohol. This may help reduce pain CLARITIN - use Claritin (generic name is loratadine) for the first two weeks of treatment or the day of, the day before, and the day after injecting. This will help to minimize injection site reactions CORTISONE CREAM - apply if injection site is irritated and itching CALL ME - if injection site reaction is bigger than the size of your fist, looks infected, blisters, or if you develop hives

## 2023-08-04 ENCOUNTER — Other Ambulatory Visit: Payer: Self-pay | Admitting: Pulmonary Disease

## 2023-08-04 DIAGNOSIS — K219 Gastro-esophageal reflux disease without esophagitis: Secondary | ICD-10-CM

## 2023-08-04 DIAGNOSIS — J302 Other seasonal allergic rhinitis: Secondary | ICD-10-CM

## 2023-08-04 DIAGNOSIS — J383 Other diseases of vocal cords: Secondary | ICD-10-CM

## 2023-08-07 ENCOUNTER — Encounter (HOSPITAL_BASED_OUTPATIENT_CLINIC_OR_DEPARTMENT_OTHER): Payer: Self-pay | Admitting: Pulmonary Disease

## 2023-08-09 NOTE — Telephone Encounter (Signed)
Could you help me verify if the following medications for the patient will be covered and what steps we need to get them covered starting in January? Patient will be on medicare and requesting formulary exception. -Nucala (Dev[place]) -Markus Daft and Paulene Floor (Prior auth team)

## 2023-08-12 ENCOUNTER — Encounter (HOSPITAL_BASED_OUTPATIENT_CLINIC_OR_DEPARTMENT_OTHER): Payer: Self-pay | Admitting: Pulmonary Disease

## 2023-08-12 DIAGNOSIS — J4551 Severe persistent asthma with (acute) exacerbation: Secondary | ICD-10-CM

## 2023-08-12 MED ORDER — PREDNISONE 10 MG PO TABS: ORAL_TABLET | ORAL | 0 refills | Status: AC

## 2023-08-12 NOTE — Telephone Encounter (Signed)
Virtual Visit via Telephone Note  I connected with Jane Bennett on 08/12/23 at  by telephone and verified that I am speaking with the correct person using two identifiers.  Location: Patient: Home Provider: [facility]   I discussed the limitations, risks, security and privacy concerns of performing an evaluation and management service by telephone and the availability of in person appointments. I also discussed with the patient that there may be a patient responsible charge related to this service. The patient expressed understanding and agreed to proceed.  The patient gave consent for this Medical Advice Message and is aware that it may result in a bill to their insurance company as well as the possibility that this may result in a co-payment or deductible. They are an established patient, but are not seeking medical advice exclusively about a problem treated during an in person or video visit in the last 7 days. I did not recommend an in person or video visit within 7 days of my last reply.  History of Present Illness: She reports productive cough wheezing and shortness of breath x 1 week. Some improvement by 50% but still has persistent symptoms. Denies fevers, chills. Believes this is similar to a viral illness that family members had but they have already recovered.   Observations/Objective: Audible cough with rhonchi on phone, not in respiratory distress  Assessment and Plan: Asthma exacerbation --Prednisone taper ordered --Counseled if worsening symptoms or develop fever to contact office for re-evaluation --If respiratory distress, recommend ED evaluation  Follow Up Instructions: Recall placed 11/2023   I discussed the assessment and treatment plan with the patient. The patient was provided an opportunity to ask questions and all were answered. The patient agreed with the plan and demonstrated an understanding of the instructions.   The patient was advised to call back or  seek an in-person evaluation if the symptoms worsen or if the condition fails to improve as anticipated.  I provided 10 minutes of non-face-to-face time during this encounter.    Anjuli Gemmill Mechele Collin, MD

## 2023-08-14 MED ORDER — DOXYCYCLINE MONOHYDRATE 100 MG PO CAPS: 100.0000 mg | ORAL_CAPSULE | Freq: Two times a day (BID) | ORAL | 0 refills | Status: DC

## 2023-08-14 NOTE — Addendum Note (Signed)
Addended by: Luciano Cutter on: 08/14/2023 11:38 AM   Modules accepted: Orders

## 2023-08-24 ENCOUNTER — Telehealth: Payer: Self-pay | Admitting: Pulmonary Disease

## 2023-08-24 DIAGNOSIS — J4550 Severe persistent asthma, uncomplicated: Secondary | ICD-10-CM

## 2023-08-24 NOTE — Telephone Encounter (Signed)
[facility] is calling because prescriptions need to be filled by [facility] because they are out of network with the patient's new insurance. [facility] can not fill the medicine and they patient will have to request [facility] to refill their prescriptions.

## 2023-08-26 NOTE — Telephone Encounter (Signed)
Jane Bennett, could you please look into this? Can future scripts for her Harrington Challenger be sent to [facility]?

## 2023-08-27 MED ORDER — NUCALA 100 MG/ML ~~LOC~~ SOAJ
100.0000 mg | SUBCUTANEOUS | 4 refills | Status: DC
Start: 2023-08-27 — End: 2024-01-19

## 2023-08-27 NOTE — Telephone Encounter (Signed)
Patient is on Nucala and looks like she has switched to [facility]. We will need updated authorization through new insurance  Submitted an URGENT Prior Authorization request to [facility] for NUCALA via CoverMyMeds. Will update once we receive a response.  Key: [id number]   **of note patient has not been seen since starting Nucala mid-October so we do not have clinical note suggesting patient's asthma is doing well on Nucala. Nevertheless, PA for Nucala as continuation of treatment has been submitted**  Chesley Mires, PharmD, MPH, BCPS, CPP Clinical Pharmacist (Rheumatology and Pulmonology)

## 2023-09-02 NOTE — Telephone Encounter (Signed)
Received notification from  [facility]  regarding a prior authorization for NUCALA. Authorization has been APPROVED from 08/29/2023 to 02/27/2024. Approval letter sent to scan center.  Patient must fill through [facility]: [phone]  Authorization # [id number]  MyChart message sent to patient with information  Chesley Mires, PharmD, MPH, BCPS, CPP Clinical Pharmacist (Rheumatology and Pulmonology)

## 2023-09-30 ENCOUNTER — Telehealth: Payer: Self-pay | Admitting: Pharmacist

## 2023-09-30 NOTE — Telephone Encounter (Signed)
 Patient has switched to [facility] Medicare. Unfortunately we do not have updated OV notes since she started Nucala  on 07/08/2023.  Staff message sent to scheduling team  Sherry Pennant, PharmD, MPH, BCPS, CPP Clinical Pharmacist (Rheumatology and Pulmonology)

## 2023-10-20 ENCOUNTER — Encounter (HOSPITAL_BASED_OUTPATIENT_CLINIC_OR_DEPARTMENT_OTHER): Payer: Self-pay | Admitting: Pulmonary Disease

## 2023-10-20 ENCOUNTER — Telehealth (HOSPITAL_BASED_OUTPATIENT_CLINIC_OR_DEPARTMENT_OTHER): Payer: No Typology Code available for payment source | Admitting: Pulmonary Disease

## 2023-10-20 ENCOUNTER — Other Ambulatory Visit (HOSPITAL_COMMUNITY): Payer: Self-pay

## 2023-10-20 DIAGNOSIS — J4550 Severe persistent asthma, uncomplicated: Secondary | ICD-10-CM | POA: Diagnosis not present

## 2023-10-20 DIAGNOSIS — G4733 Obstructive sleep apnea (adult) (pediatric): Secondary | ICD-10-CM

## 2023-10-20 DIAGNOSIS — J309 Allergic rhinitis, unspecified: Secondary | ICD-10-CM | POA: Diagnosis not present

## 2023-10-20 MED ORDER — IPRATROPIUM BROMIDE 0.06 % NA SOLN: 2.0000 | Freq: Four times a day (QID) | NASAL | 3 refills | Status: AC

## 2023-10-20 MED ORDER — BREZTRI AEROSPHERE 160-9-4.8 MCG/ACT IN AERO: 2.0000 | INHALATION_SPRAY | Freq: Two times a day (BID) | RESPIRATORY_TRACT | 11 refills | Status: DC

## 2023-10-20 MED ORDER — AIRSUPRA 90-80 MCG/ACT IN AERO: 2.0000 | INHALATION_SPRAY | RESPIRATORY_TRACT | 5 refills | Status: AC | PRN

## 2023-10-20 NOTE — Progress Notes (Signed)
Virtual Visit via Video Note  I connected with Jane Bennett on 10/20/23 at  9:30 AM EST by a video enabled telemedicine application and verified that I am speaking with the correct person using two identifiers.  Location: Patient: Home Provider: [facility]   I discussed the limitations of evaluation and management by telemedicine and the availability of in person appointments. The patient expressed understanding and agreed to proceed.   I discussed the assessment and treatment plan with the patient. The patient was provided an opportunity to ask questions and all were answered. The patient agreed with the plan and demonstrated an understanding of the instructions.   The patient was advised to call back or seek an in-person evaluation if the symptoms worsen or if the condition fails to improve as anticipated.   Patient ID: Jane Bennett, female    DOB: [birth year], 58 y.o.   MRN: [id number]  Chief Complaint  Patient presents with   Follow-up    Asthma follow-up. Started Nucala in 06/2023   HPI:  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   Started Harrington Challenger May/June 2022. Has baseline inspiratory are expiratory wheeze and chronic cough. Last flare 08/2021 when she had covid. 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 Usually triggered by heat, humidity, allergens and illness.  10/16/22 One month ago she was diagnosed with influenza and treated with supportive care and steroid shot. She was seen again for persistent cough, shortness of breath and wheezing and given additional prednisone taper which she has now completed. Using nebs less now and only needing rescue inhaler 3-4 times daily. Overall her  asthma symptoms seem controlled on Fasenra but has noticed more breakthrough symptoms prior to needing her next injection. Feels like her allergy medicine not improving. Compliant with CPAP nightly.   06/16/23 Since our last visit she has intentionally lost 40 lbs in the last 8 months. She reports her breathing has improved since then but does have some shortness of breath with activity. No dedicated exercise time but does short intervals of 10-30 min of housework, walking to chicken coop or going to the store or general walking. She can feel the Harrington Challenger is not as effective near the end of treatment. She reports night awakenings twice a night with productive cough associated with wheezing. Still uses albuterol daily despite Breztri. Has not needed nebulizers. Has congestion seasonally that always worsens in the fall. She is compliant with CPAP nightly and helps with her quality of sleep.  10/20/23 Since our last visit patient has been approved for Nucala and presents for updated clinic appointment to file with her new insurance. Last exacerbation was treated on 08/12/23 with prednisone and doxycycline. Her last Harrington Challenger injection was in August 2024. She was started on Nucala on 07/08/23. She reports she has been doing well since initiation. Is having nasal congestion this month due to colder months. Still gets triggered by strong odors and heat and emotional stress. Overall no coughing or wheezing or chest tightness. Shortness of breath with activity and bending. She is able to manage household activities but no dedicated exercise. She is currently on Breztri twice a day and Airsupra 2-3 times a week.  Questionaires / Pulmonary Flowsheets:  ACT:  Asthma Control Test ACT Total Score  10/20/2023  9:34 AM 15  06/16/2023  8:32 AM 10  10/16/2022  8:32 AM 8   2021 Jan Feb Mar April May June July Aug Sept Oct Nov Dec      X  X    X    2022 Jan Feb Mar April May June July Aug Sept Oct Nov Dec    X   O          2023 Jan Feb Mar April May June July Aug Sept Oct Nov Dec        X   X   X  2024 Jan Feb Mar April May June July Aug Sept Oct Nov Dec   X         O X    2025 Jan Feb Mar April May June July Aug Sept Oct Nov Dec                2026 Jan Feb Mar April May June July Aug Sept Oct Nov Dec                2027 Jan Feb Mar April May June July Aug Sept Oct Nov Dec                  O = Started Harrington Challenger 01/16/21-05/2023 O = Larence Penning 07/08/23   Past Medical History:  Diagnosis Date   Asthma    DVT (deep venous thrombosis) (HCC)    High cholesterol    Hypertension    Pneumonia     Outpatient Encounter Medications as of 10/20/2023  Medication Sig   albuterol (PROVENTIL) (2.5 MG/3ML) 0.083% nebulizer solution Take 3 mLs (2.5 mg total) by nebulization every 6 (six) hours as needed for wheezing or shortness of breath.   albuterol (VENTOLIN HFA) 108 (90 Base) MCG/ACT inhaler INHALE 1 TO 2 PUFFS BY MOUTH EVERY 4 TO 6 HOURS   Albuterol-Budesonide (AIRSUPRA) 90-80 MCG/ACT AERO Inhale 2 puffs into the lungs every 4 (four) hours as needed.   Budeson-Glycopyrrol-Formoterol (BREZTRI AEROSPHERE) 160-9-4.8 MCG/ACT AERO Inhale 2 puffs into the lungs in the morning and at bedtime.   EPINEPHrine 0.3 mg/0.3 mL IJ SOAJ injection Inject 0.3 mg into the muscle as needed for anaphylaxis.   EQ ALLERGY RELIEF, CETIRIZINE, 10 MG tablet Take 1 tablet by mouth once daily   famotidine (PEPCID) 40 MG tablet Take 1 tablet (40 mg total) by mouth at bedtime.   fluticasone (FLONASE) 50 MCG/ACT nasal spray Place 1 spray into both nostrils daily.   gabapentin (NEURONTIN) 300 MG capsule Take 300 mg by mouth 3 (three) times daily.   ipratropium (ATROVENT) 0.06 % nasal spray Place 2 sprays into the nose 4 (four) times daily.   losartan-hydrochlorothiazide (HYZAAR) 50-12.5 MG tablet Take 1 tablet by mouth daily.   Mepolizumab (NUCALA) 100 MG/ML SOAJ Inject 1 mL (100 mg total) into the skin every 28 (twenty-eight) days.    Omega-3 Fatty Acids (FISH OIL) 1000 MG CAPS Take by mouth.   omeprazole (PRILOSEC) 40 MG capsule Take 1 capsule by mouth once daily   ondansetron (ZOFRAN-ODT) 8 MG disintegrating tablet Take 8 mg by mouth every 6 (six) hours as needed.   pseudoephedrine (SUDAFED) 60 MG tablet Take 1 tablet (60 mg total) by mouth every 6 (six) hours as needed for congestion.   rosuvastatin (CRESTOR) 10 MG tablet SMARTSIG:1 Tablet(s) By Mouth Every Evening   Spacer/Aero-Holding Chambers (EQ SPACE CHAMBER ANTI-STATIC) DEVI Inhale  1 each into the lungs as needed.   [DISCONTINUED] Albuterol-Budesonide (AIRSUPRA) 90-80 MCG/ACT AERO INHALE 2 PUFFS EVERY 4 HOURS AS NEEDED   [DISCONTINUED] Budeson-Glycopyrrol-Formoterol (BREZTRI AEROSPHERE) 160-9-4.8 MCG/ACT AERO Inhale 2 puffs into the lungs in the morning and at bedtime.   [DISCONTINUED] doxycycline (MONODOX) 100 MG capsule Take 1 capsule (100 mg total) by mouth 2 (two) times daily.   [DISCONTINUED] ipratropium (ATROVENT) 0.06 % nasal spray USE 2 SPRAY(S) IN EACH NOSTRIL IN THE MORNING AND AT BEDTIME   No facility-administered encounter medications on file as of 10/20/2023.     Review of Systems Review of Systems  Constitutional:  Negative for chills, diaphoresis, fever, malaise/fatigue and weight loss.  HENT:  Negative for congestion.   Respiratory:  Positive for shortness of breath. Negative for cough, hemoptysis, sputum production and wheezing.   Cardiovascular:  Negative for chest pain, palpitations and leg swelling.    There were no vitals taken for this visit.  Physical Exam: General: Well-appearing, no acute distress HENT: [place], AT Eyes: EOMI, no scleral icterus Respiratory: No respiratory distress Neuro: AAO x4, CNII-XII grossly intact Psych: Normal mood, normal affect  Data Reviewed:  Imaging: CTA 10/11/19 - No pulmonary embolism. Normal parenchyma, no effusion or edema HRCT at [facility] 03/30/2020 (report only) - No evidence of interstitial lung  disease. Mild diffuse central airway thickening which can be seen with asthma or bronchitis  PFTs: 01/06/2020 FVC 1.27 [39%], FEV1 1.06 [41%], F/F 84 TLC 3.97 [83%], DLCO 20.20 [104%] Interpretation: Normal spirometry however significant bronchodilator response with post-bronchodilator FEV1 increased by 25% and increased small airway disease response. No evidence of restrictive lung disease. Normal gas exchange  Labs: IgE 01/06/2020- 18 CBC 10/11/2019-WBC 8.7, eos 2%, absolute eosinophil count 200  09/15/2019-SARS-CoV-2-positive  05/30/2020-home sleep study-AHI 8.5, SaO2 low 75%  CPAP Compliance Report 07/18/22-10/15/22 Usage 100% >4 hours 100% AHI 0.9  CPAP Compliance Report 03/18/23-06/15/23 Usage days 90/90 days (100%) >4 hours 89 days (99%) Avg usage 7h 53 min Auto CPAP 5-15, EPR 3 AHI 1.2   Assessment & Plan:  58 year old female never smoker with asthma and hx of vocal cord dysfunction secondary to covid-19 long hauler syndrome now resolved who presents for asthma follow-up. Last exacerbation in January and November of 2024. Transitioned from [place] to [place] in 06/2023 with improved symptoms however continues to have breakthrough asthma symptoms requiring rescue 2-3 times a week with nocturnal awakenings. Regarding the night symptoms, difficult to elicit if related to OSA vs asthma. Overall improved with current regimen. May need to consider increasing ICS component of inhaler in addition to LAMA if symptoms persistent after being on Nucala 9-12 months. Discussed clinical course and management of asthma including bronchodilator regimen, preventive care and action plan for exacerbation.  Severe eosinophilia persistent asthma --CONTINUE Nucala as scheduled --CONTINUE Breztri 160-9-4.5 mcg TWO puffs TWICE daily with aerochamber. REFILL --CONTINUE Airsupra or Albuterol TWO puffs every 4 hours AS NEEDED for shortness of breath or wheezing  Allergic Rhinitis --CONTINUE atrovent nasal  spray daily --CONTINUE Flonase nightly --CONTINUE cetirizine 10 mg daily. Ok to change to zyrtec or allegra if not effective --CONTINUE Pseudoephedrine 60 mg every 6 hours as needed. Please use sparingly and caution in setting of hypertension  --Continue saline rinses, nettie pot --Minimize outdoor exposure as much as possible  OSA --Will review compliance report at next visit. She self reports nightly use. --Patient uses NIV for more than four hours nightly for at least 70% of nights during the last three months  of usage. The patient has been using and benefiting from PAP use and will continue to benefit from therapy.  --Counseled on sleep hygiene --Counseled on weight loss/maintenance of healthy weight --Counseled NOT to drive if/when sleepy    Health Maintenance Immunization History  Administered Date(s) Administered   Influenza Whole 07/04/2019, 07/26/2020   No orders of the defined types were placed in this encounter.   Meds ordered this encounter  Medications   Budeson-Glycopyrrol-Formoterol (BREZTRI AEROSPHERE) 160-9-4.8 MCG/ACT AERO    Sig: Inhale 2 puffs into the lungs in the morning and at bedtime.    Dispense:  10.7 g    Refill:  11    Lot Number?:   [id number]    Expiration Date?:   03/22/2022    Manufacturer?:   [employer] [[id number]]    Quantity:   4   Albuterol-Budesonide (AIRSUPRA) 90-80 MCG/ACT AERO    Sig: Inhale 2 puffs into the lungs every 4 (four) hours as needed.    Dispense:  11 g    Refill:  5   ipratropium (ATROVENT) 0.06 % nasal spray    Sig: Place 2 sprays into the nose 4 (four) times daily.    Dispense:  15 mL    Refill:  3   No follow-ups on file.  I have spent a total time of 40-minutes on the day of the appointment including chart review, data review, collecting history, coordinating care and discussing medical diagnosis and plan with the patient/family. Past medical history, allergies, medications were reviewed. Pertinent imaging, labs and tests  included in this note have been reviewed and interpreted independently by me  Mechele Collin [facility] 10/20/2023 9:52 AM

## 2023-10-20 NOTE — Patient Instructions (Signed)
Severe eosinophilia persistent asthma --CONTINUE Nucala as scheduled --CONTINUE Breztri 160-9-4.5 mcg TWO puffs TWICE daily with aerochamber. REFILL --CONTINUE Airsupra or Albuterol TWO puffs every 4 hours AS NEEDED for shortness of breath or wheezing  Allergic Rhinitis --CONTINUE atrovent nasal spray daily --CONTINUE Flonase nightly --CONTINUE cetirizine 10 mg daily. Ok to change to zyrtec or allegra if not effective --CONTINUE Pseudoephedrine 60 mg every 6 hours as needed. Please use sparingly and caution in setting of hypertension  --Continue saline rinses, nettie pot --Minimize outdoor exposure as much as possible

## 2023-10-20 NOTE — Addendum Note (Signed)
Addended by: Luciano Cutter on: 10/20/2023 01:20 PM   Modules accepted: Level of Service

## 2023-10-21 ENCOUNTER — Other Ambulatory Visit (HOSPITAL_COMMUNITY): Payer: Self-pay

## 2023-10-21 ENCOUNTER — Encounter (HOSPITAL_BASED_OUTPATIENT_CLINIC_OR_DEPARTMENT_OTHER): Payer: Self-pay | Admitting: Pulmonary Disease

## 2023-10-21 NOTE — Telephone Encounter (Incomplete Revision)
 Submitted a Prior Authorization request to [facility] for NUCALA via CoverMyMeds. Will update once we receive a response.  [employer] [place]   Per automated response: Prior Authorization Not Required  Ran test claim for Fasenra. 56-day supply is $1503.49  Chesley Mires, PharmD, MPH, BCPS, CPP Clinical Pharmacist (Rheumatology and Pulmonology)

## 2023-10-21 NOTE — Telephone Encounter (Signed)
Submitted a Prior Authorization request to [facility] for NUCALA via CoverMyMeds. Will update once we receive a response.  [employer] [place]   Per automated response: Prior Authorization Not Required  Ran test claim for Fasenra. 56-day supply is $1503.49  Chesley Mires, PharmD, MPH, BCPS, CPP Clinical Pharmacist (Rheumatology and Pulmonology)

## 2023-10-22 NOTE — Telephone Encounter (Signed)
Spoke to pharmacy and it was not due to deductible that is her new cost with her new insurance for the medication.

## 2023-10-25 ENCOUNTER — Other Ambulatory Visit: Payer: Self-pay | Admitting: Pulmonary Disease

## 2023-10-25 DIAGNOSIS — J302 Other seasonal allergic rhinitis: Secondary | ICD-10-CM

## 2023-10-30 ENCOUNTER — Other Ambulatory Visit (HOSPITAL_COMMUNITY): Payer: Self-pay

## 2023-10-30 NOTE — Telephone Encounter (Signed)
 Per test claim through primary [employer], copay is $99. Patient states that her husband's insurance is secondary. She states she received Nucala  late last month from [facility] with no copay (after secondary insurance was billed)  I tried to bill secondary insurance today however patient is locked into [facility] but there appears to be no rejection stating plans do not have coordinated benefits.  NFN  Pasqualino Witherspoon, PharmD, MPH, BCPS, CPP Clinical Pharmacist (Rheumatology and Pulmonology)

## 2023-11-11 ENCOUNTER — Other Ambulatory Visit: Payer: Self-pay | Admitting: Pulmonary Disease

## 2023-11-11 DIAGNOSIS — K219 Gastro-esophageal reflux disease without esophagitis: Secondary | ICD-10-CM

## 2023-11-11 DIAGNOSIS — J383 Other diseases of vocal cords: Secondary | ICD-10-CM

## 2023-11-17 DIAGNOSIS — E7849 Other hyperlipidemia: Secondary | ICD-10-CM | POA: Diagnosis not present

## 2023-11-24 DIAGNOSIS — G4733 Obstructive sleep apnea (adult) (pediatric): Secondary | ICD-10-CM | POA: Diagnosis not present

## 2023-11-24 DIAGNOSIS — E7849 Other hyperlipidemia: Secondary | ICD-10-CM | POA: Diagnosis not present

## 2023-11-24 DIAGNOSIS — J383 Other diseases of vocal cords: Secondary | ICD-10-CM | POA: Diagnosis not present

## 2023-11-24 DIAGNOSIS — E782 Mixed hyperlipidemia: Secondary | ICD-10-CM | POA: Diagnosis not present

## 2023-11-24 DIAGNOSIS — Z6834 Body mass index (BMI) 34.0-34.9, adult: Secondary | ICD-10-CM | POA: Diagnosis not present

## 2023-11-24 DIAGNOSIS — I10 Essential (primary) hypertension: Secondary | ICD-10-CM | POA: Diagnosis not present

## 2024-01-12 ENCOUNTER — Ambulatory Visit (HOSPITAL_BASED_OUTPATIENT_CLINIC_OR_DEPARTMENT_OTHER): Payer: No Typology Code available for payment source | Admitting: Pulmonary Disease

## 2024-01-12 ENCOUNTER — Encounter (HOSPITAL_BASED_OUTPATIENT_CLINIC_OR_DEPARTMENT_OTHER): Payer: Self-pay | Admitting: Pulmonary Disease

## 2024-01-12 VITALS — BP 118/82 | HR 62 | Ht 62.0 in | Wt 190.2 lb

## 2024-01-12 DIAGNOSIS — J4550 Severe persistent asthma, uncomplicated: Secondary | ICD-10-CM

## 2024-01-12 DIAGNOSIS — G4733 Obstructive sleep apnea (adult) (pediatric): Secondary | ICD-10-CM

## 2024-01-12 NOTE — Patient Instructions (Signed)
 Severe eosinophilia persistent asthma --CONTINUE Nucala  as scheduled --CONTINUE Breztri  160-9-4.5 mcg TWO puffs TWICE daily with aerochamber --CONTINUE Airsupra  or Albuterol  TWO puffs every 4 hours AS NEEDED for shortness of breath or wheezing  Allergic Rhinitis --CONTINUE atrovent  nasal spray daily as needed --CONTINUE Flonase  nightly --CONTINUE cetirizine  10 mg daily. Ok to change to zyrtec  or allegra if not effective --Use Pseudoephedrine  60 mg every 6 hours as needed. Please use sparingly and caution in setting of hypertension  --Continue saline rinses, nettie pot --Minimize outdoor exposure as much as possible  OSA --CPAP compliance reviewed. 100% adherent. --Patient uses NIV for more than four hours nightly for at least 70% of nights during the last three months of usage. The patient has been using and benefiting from PAP use and will continue to benefit from therapy.  --Counseled on sleep hygiene --Counseled on weight loss/maintenance of healthy weight --Counseled NOT to drive if/when sleepy --Advised patient to wear CPAP for at least 4 hours each night for greater than 70% of the time to avoid the machine being repossessed by insurance.

## 2024-01-12 NOTE — Progress Notes (Signed)
 Patient ID: Jane Bennett, female    DOB: 04-16-1966, 58 y.o.   MRN: [id number]  Chief Complaint  Patient presents with   Follow-up    Asthma   HPI:  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   Started Fasenra  May/June 2022. Has baseline inspiratory are expiratory wheeze and chronic cough. Last flare 08/2021 when she had covid. 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 Usually triggered by heat, humidity, allergens and illness.  10/16/22 One month ago she was diagnosed with influenza and treated with supportive care and steroid shot. She was seen again for persistent cough, shortness of breath and wheezing and given additional prednisone  taper which she has now completed. Using nebs less now and only needing rescue inhaler 3-4 times daily. Overall her asthma symptoms seem controlled on Fasenra  but has noticed more breakthrough symptoms prior to needing her next injection. Feels like her allergy medicine not improving. Compliant with CPAP nightly.   06/16/23 Since our last visit she has intentionally lost 40 lbs in the last 8 months. She reports her breathing has improved since then but does have some shortness of breath with activity. No dedicated exercise time but does short intervals of 10-30 min of housework, walking to chicken coop or going to the store or general walking. She can feel the Fasenra  is not as effective near the end of treatment. She reports night awakenings twice a night with productive cough associated with wheezing. Still uses albuterol  daily despite Breztri . Has not needed nebulizers. Has congestion seasonally that always worsens in the fall. She is compliant with CPAP nightly and helps with  her quality of sleep.  10/20/23 Since our last visit patient has been approved for Nucala  and presents for updated clinic appointment to file with her new insurance. Last exacerbation was treated on 08/12/23 with prednisone  and doxycycline . Her last Fasenra  injection was in August 2024. She was started on Nucala  on 07/08/23. She reports she has been doing well since initiation. Is having nasal congestion this month due to colder months. Still gets triggered by strong odors and heat and emotional stress. Overall no coughing or wheezing or chest tightness. Shortness of breath with activity and bending. She is able to manage household activities but no dedicated exercise. She is currently on Breztri  twice a day and Airsupra  2-3 times a week.  01/12/24 Since our last visit she has been doing overall well. No exacerbations since our last visit. Has been on Nucala  and Breztri . Denies cough or wheezing. Has shortness of breath with activity. Walks up to 5000 steps a day. Compliant with CPAP and reports benefit with use including good quality sleep and energy.  Questionaires / Pulmonary Flowsheets:   ACT:  Asthma Control Test ACT Total Score  10/20/2023  9:34 AM 15  06/16/2023  8:32 AM 10  10/16/2022  8:32 AM 8   2021 Jan Feb Mar April May June July Aug Sept Oct Nov Dec      X  X    X    2022 Jan Feb Mar April May June July Aug Sept Oct Nov Dec    X   O         2023 Jan Feb  Mar April May June July Aug Sept Oct Nov Dec        X   X   X  2024 Jan Feb Mar April May June July Aug Sept Oct Nov Dec   X         O X    2025 Jan Feb Mar April May June July Aug Sept Oct Nov Dec                2026 Jan Feb Mar April May June July Aug Sept Oct Nov Dec                2027 Jan Feb Mar April May June July Aug Sept Oct Nov Dec                  O = Started Fasenra  01/16/21-05/2023 O = Started Fasenra  07/08/23   Past Medical History:  Diagnosis Date   Asthma    DVT (deep venous thrombosis) (HCC)    High  cholesterol    Hypertension    Pneumonia     Outpatient Encounter Medications as of 01/12/2024  Medication Sig   albuterol  (PROVENTIL ) (2.5 MG/3ML) 0.083% nebulizer solution Take 3 mLs (2.5 mg total) by nebulization every 6 (six) hours as needed for wheezing or shortness of breath.   albuterol  (VENTOLIN  HFA) 108 (90 Base) MCG/ACT inhaler INHALE 1 TO 2 PUFFS BY MOUTH EVERY 4 TO 6 HOURS   Albuterol -Budesonide (AIRSUPRA ) 90-80 MCG/ACT AERO Inhale 2 puffs into the lungs every 4 (four) hours as needed.   Budeson-Glycopyrrol-Formoterol (BREZTRI  AEROSPHERE) 160-9-4.8 MCG/ACT AERO Inhale 2 puffs into the lungs in the morning and at bedtime.   cetirizine  (EQ ALLERGY RELIEF, CETIRIZINE ,) 10 MG tablet Take 1 tablet by mouth once daily   EPINEPHrine  0.3 mg/0.3 mL IJ SOAJ injection Inject 0.3 mg into the muscle as needed for anaphylaxis.   famotidine  (PEPCID ) 40 MG tablet Take 1 tablet (40 mg total) by mouth at bedtime.   fluticasone  (FLONASE ) 50 MCG/ACT nasal spray Place 1 spray into both nostrils daily.   gabapentin (NEURONTIN) 300 MG capsule Take 300 mg by mouth 3 (three) times daily.   ipratropium (ATROVENT ) 0.06 % nasal spray Place 2 sprays into the nose 4 (four) times daily.   losartan-hydrochlorothiazide (HYZAAR) 50-12.5 MG tablet Take 1 tablet by mouth daily.   Mepolizumab  (NUCALA ) 100 MG/ML SOAJ Inject 1 mL (100 mg total) into the skin every 28 (twenty-eight) days.   Omega-3 Fatty Acids (FISH OIL) 1000 MG CAPS Take by mouth.   omeprazole  (PRILOSEC) 40 MG capsule Take 1 capsule by mouth once daily   ondansetron (ZOFRAN-ODT) 8 MG disintegrating tablet Take 8 mg by mouth every 6 (six) hours as needed.   pseudoephedrine  (SUDAFED) 60 MG tablet Take 1 tablet (60 mg total) by mouth every 6 (six) hours as needed for congestion.   rosuvastatin (CRESTOR) 10 MG tablet SMARTSIG:1 Tablet(s) By Mouth Every Evening   Spacer/Aero-Holding Chambers (EQ SPACE CHAMBER ANTI-STATIC) DEVI Inhale 1 each into the lungs as  needed.   No facility-administered encounter medications on file as of 01/12/2024.     Review of Systems Review of Systems  Constitutional:  Negative for chills, diaphoresis, fever, malaise/fatigue and weight loss.  HENT:  Negative for congestion.   Respiratory:  Positive for shortness of breath. Negative for cough, hemoptysis, sputum production and wheezing.   Cardiovascular:  Negative for chest pain, palpitations and leg swelling.    BP 118/82   Pulse 62  Ht 5\' 2"  (1.575 m)   Wt 190 lb 3.2 oz (86.3 kg)   SpO2 99%   BMI 34.79 kg/m   Physical Exam: General: Well-appearing, no acute distress HENT: [place], AT Eyes: EOMI, no scleral icterus Respiratory: Clear to auscultation bilaterally.  No crackles, wheezing or rales Cardiovascular: RRR, -M/R/G, no JVD Extremities:-Edema,-tenderness Neuro: AAO x4, CNII-XII grossly intact Psych: Normal mood, normal affect  Data Reviewed:  Imaging: CTA 10/11/19 - No pulmonary embolism. Normal parenchyma, no effusion or edema HRCT at [facility] 03/30/2020 (report only) - No evidence of interstitial lung disease. Mild diffuse central airway thickening which can be seen with asthma or bronchitis  PFTs: 01/06/2020 FVC 1.27 [39%], FEV1 1.06 [41%], F/F 84 TLC 3.97 [83%], DLCO 20.20 [104%] Interpretation: Normal spirometry however significant bronchodilator response with post-bronchodilator FEV1 increased by 25% and increased small airway disease response. No evidence of restrictive lung disease. Normal gas exchange  Labs: IgE 01/06/2020- 18 CBC 10/11/2019-WBC 8.7, eos 2%, absolute eosinophil count 200  09/15/2019-SARS-CoV-2-positive  05/30/2020-home sleep study-AHI 8.5, SaO2 low 75%  CPAP Compliance Report 07/18/22-10/15/22 Usage 100% >4 hours 100% AHI 0.9  CPAP Compliance Report 03/18/23-06/15/23 Usage days 90/90 days (100%) >4 hours 89 days (99%) Avg usage 7h 53 min Auto CPAP 5-15, EPR 3 AHI 1.2  CPAP Compliance Report 12/13/23-01/11/24 Usage days  30/30 days (100%) >4 hours 30 days (100%) Avg usage 8h 20 min Auto CPAP 5-15, EPR 3 AHI 0.9  Assessment & Plan:  58 year old female never smoker with asthma and hx of vocal cord dysfunction secondary to covid-19 now resolved who presents for asthma follow-up. On Nucala  and Breztri  with no exacerbations since Nov 2024. Hold on stepping down on inhalers since cost for Breztri  is the same as ICS/LABA.  Discussed clinical course and management of asthma including bronchodilator regimen, preventive care including vaccinations and action plan for exacerbation.  Severe eosinophilia persistent asthma --CONTINUE Nucala  as scheduled. Started 06/2023 --CONTINUE Breztri  160-9-4.5 mcg TWO puffs TWICE daily with aerochamber --CONTINUE Airsupra  or Albuterol  TWO puffs every 4 hours AS NEEDED for shortness of breath or wheezing --Encouraged 7000-10,000 steps daily  Allergic Rhinitis --CONTINUE atrovent  nasal spray daily as needed --CONTINUE Flonase  nightly --CONTINUE cetirizine  10 mg daily. Ok to change to zyrtec  or allegra if not effective --Use Pseudoephedrine  60 mg every 6 hours as needed. Please use sparingly and caution in setting of hypertension  --Continue saline rinses, nettie pot --Minimize outdoor exposure as much as possible  OSA --CPAP compliance reviewed. 100% adherent. --Patient uses NIV for more than four hours nightly for at least 70% of nights during the last three months of usage. The patient has been using and benefiting from PAP use and will continue to benefit from therapy.  --Counseled on sleep hygiene --Counseled on weight loss/maintenance of healthy weight --Counseled NOT to drive if/when sleepy --Advised patient to wear CPAP for at least 4 hours each night for greater than 70% of the time to avoid the machine being repossessed by insurance.  Health Maintenance Immunization History  Administered Date(s) Administered   Influenza Whole 07/04/2019, 07/26/2020   No orders of  the defined types were placed in this encounter.   No orders of the defined types were placed in this encounter.  Return in about 7 months (around 08/13/2024).  I have spent a total time of 30-minutes on the day of the appointment including chart review, data review, collecting history, coordinating care and discussing medical diagnosis and plan with the patient/family. Past medical history, allergies,  medications were reviewed. Pertinent imaging, labs and tests included in this note have been reviewed and interpreted independently by me.  Genetta Kenning, MD [facility] Pulmonary/Critical Care Medicine 01/12/2024 10:13 AM

## 2024-01-18 ENCOUNTER — Other Ambulatory Visit: Payer: Self-pay | Admitting: Pulmonary Disease

## 2024-01-18 DIAGNOSIS — J4550 Severe persistent asthma, uncomplicated: Secondary | ICD-10-CM

## 2024-01-19 NOTE — Telephone Encounter (Signed)
 Refill sent for NUCALA  to [facility]: [phone]  Dose: 100mg  subcut every 4 weeks  Last OV: 01/12/24 Provider: Dr. Washington Hacker  Next OV: due Nov 2025 (not yet scheduled)  Geraldene Kleine, PharmD, MPH, BCPS Clinical Pharmacist (Rheumatology and Pulmonology)

## 2024-01-19 NOTE — Telephone Encounter (Signed)
Refill Nucala

## 2024-02-11 ENCOUNTER — Telehealth: Payer: Self-pay | Admitting: *Deleted

## 2024-02-11 DIAGNOSIS — J4550 Severe persistent asthma, uncomplicated: Secondary | ICD-10-CM

## 2024-02-11 NOTE — Telephone Encounter (Signed)
 Copied from CRM [id number]. Topic: Clinical - Prescription Issue >> Feb 11, 2024 12:51 PM Evie Hoff wrote: Reason for CRM: delicia from [facility] specialty pharmacy calling about a prior authorization for nucala  100 mg . They are requesting for them to look into this  Insurance ID number [id number]. Its epa request [id number] the epa code  Callback number [phone] 7:30 am to 7:30 pm [place] time  Fax number [phone]

## 2024-02-12 NOTE — Telephone Encounter (Signed)
 Submitted a Prior Authorization request to [facility] for NUCALA  via CoverMyMeds. Will update once we receive a response.  Key: [id number]

## 2024-02-16 NOTE — Telephone Encounter (Signed)
 Copied from CRM [id number]. Topic: Clinical - Medication Prior Auth >> Feb 16, 2024  1:41 PM Hilton Lucky wrote: Reason for CRM: Santo Cullen from [facility] is calling to notify that  the PA for Nucala  renewal has been denied, as it is not used in combination with another asthma related drug.

## 2024-02-19 NOTE — Telephone Encounter (Signed)
 Patient is using Breztri . Claims history screenshot faxed  Case # [id number] Fax: [phone]  Geraldene Kleine, PharmD, MPH, BCPS, CPP Clinical Pharmacist (Rheumatology and Pulmonology)

## 2024-02-20 ENCOUNTER — Other Ambulatory Visit (HOSPITAL_BASED_OUTPATIENT_CLINIC_OR_DEPARTMENT_OTHER): Payer: Self-pay | Admitting: Pulmonary Disease

## 2024-02-22 NOTE — Telephone Encounter (Signed)
 Received notification from [facility] regarding a prior authorization for NUCALA . Authorization has been APPROVED from 02/20/2024 to 02/19/2025. Approval letter sent to scan center. [facility]val letter [facility]ed to [facility]  Patient must continue to fill through [facility]: [phone]  Authorization # [id number]  Geraldene Kleine, PharmD, MPH, BCPS, CPP Clinical Pharmacist (Rheumatology and Pulmonology)

## 2024-04-14 ENCOUNTER — Encounter (HOSPITAL_BASED_OUTPATIENT_CLINIC_OR_DEPARTMENT_OTHER): Payer: Self-pay | Admitting: Pulmonary Disease

## 2024-04-23 ENCOUNTER — Other Ambulatory Visit: Payer: Self-pay | Admitting: Pulmonary Disease

## 2024-04-23 DIAGNOSIS — K219 Gastro-esophageal reflux disease without esophagitis: Secondary | ICD-10-CM

## 2024-04-23 DIAGNOSIS — J383 Other diseases of vocal cords: Secondary | ICD-10-CM

## 2024-04-27 ENCOUNTER — Other Ambulatory Visit (HOSPITAL_COMMUNITY): Payer: Self-pay

## 2024-04-27 ENCOUNTER — Telehealth (HOSPITAL_BASED_OUTPATIENT_CLINIC_OR_DEPARTMENT_OTHER): Payer: Self-pay

## 2024-04-27 MED ORDER — NUCALA 100 MG/ML ~~LOC~~ SOAJ: 100.0000 mg | SUBCUTANEOUS | 2 refills | Status: AC

## 2024-04-27 NOTE — Telephone Encounter (Signed)
 PA approval has already been obtained as of 02/22/24, a new Rx will be sent to [facility].

## 2024-04-27 NOTE — Telephone Encounter (Addendum)
 Rx for Nucala  sent to [facility]  Phone: [phone]  Sherry Pennant, PharmD, MPH, BCPS, CPP Clinical Pharmacist (Rheumatology and Pulmonology)

## 2024-04-27 NOTE — Addendum Note (Signed)
 Addended by: DAYNE SHERRY RAMAN on: 04/27/2024 02:34 PM   Modules accepted: Orders

## 2024-04-27 NOTE — Telephone Encounter (Signed)
 Copied from CRM #[id number]. Topic: Clinical - Prescription Issue >> Apr 27, 2024 12:59 PM Isabell A wrote: Reason for CRM: Patient called her insurance and was told her Nucalla shot has to go through [employer] with a prior authorization. Patient states once its approved it needs to be marked urgent because she is due for her shot next week.   Callback number: [phone]

## 2024-04-27 NOTE — Telephone Encounter (Signed)
   Please resend Rx to [facility]

## 2024-04-28 ENCOUNTER — Telehealth: Payer: Self-pay | Admitting: Pharmacist

## 2024-04-28 NOTE — Telephone Encounter (Signed)
 Submitted an URGENT Prior Authorization request to [facility] for NUCALA  via CoverMyMeds. Will update once we receive a response.  Key: [id number]

## 2024-04-29 NOTE — Telephone Encounter (Signed)
 Received notification from [facility] regarding a prior authorization for NUCALA . Authorization has been APPROVED from 04/28/2024 to 10/29/2024. Approval letter sent to scan center.  Patient must fill through [facility](phone [phone]). Already sent  Authorization # [id number]  Sherry Pennant, PharmD, MPH, BCPS, CPP Clinical Pharmacist (Rheumatology and Pulmonology)

## 2024-05-16 ENCOUNTER — Telehealth (HOSPITAL_BASED_OUTPATIENT_CLINIC_OR_DEPARTMENT_OTHER): Payer: Self-pay

## 2024-05-16 NOTE — Telephone Encounter (Signed)
 Copied from CRM #[id number]. Topic: Clinical - Prescription Issue >> May 16, 2024  2:32 PM Joesph PARAS wrote: Reason for CRM: Patient is past due for Mepolizumab  (NUCALA ) 100 MG/ML SOAJ. Primary insurance prefers [facility] but Medicare prefers [facility].  She cannot use both insurances and both these plans have no pharmacy in common. Patient may need an override and [facility] would like to make physician aware. With primary insurance, [facility] has a $100 copay and medicare will not assist with that copay since the pharmacy is out of network.  With secondary insurance, chance are that [facility] may be able to fill for cheaper but it would mean bypassing primary insurance coverage or potentially requesting an exception/override.   Would like to make clinical staff aware. Please update patient on options or plan going forward when possible.

## 2024-05-17 DIAGNOSIS — Z0001 Encounter for general adult medical examination with abnormal findings: Secondary | ICD-10-CM | POA: Diagnosis not present

## 2024-05-17 DIAGNOSIS — E7849 Other hyperlipidemia: Secondary | ICD-10-CM | POA: Diagnosis not present

## 2024-05-17 DIAGNOSIS — R7303 Prediabetes: Secondary | ICD-10-CM | POA: Diagnosis not present

## 2024-05-17 DIAGNOSIS — R5383 Other fatigue: Secondary | ICD-10-CM | POA: Diagnosis not present

## 2024-05-18 ENCOUNTER — Encounter (HOSPITAL_BASED_OUTPATIENT_CLINIC_OR_DEPARTMENT_OTHER): Payer: Self-pay | Admitting: Pulmonary Disease

## 2024-05-18 MED ORDER — PREDNISONE 10 MG PO TABS: ORAL_TABLET | ORAL | 0 refills | Status: DC

## 2024-05-18 NOTE — Telephone Encounter (Signed)
 Please advise on steroid rx

## 2024-05-18 NOTE — Telephone Encounter (Signed)
 I sent script for pred to her pharmacy

## 2024-05-24 DIAGNOSIS — Z6835 Body mass index (BMI) 35.0-35.9, adult: Secondary | ICD-10-CM | POA: Diagnosis not present

## 2024-05-24 DIAGNOSIS — Z1389 Encounter for screening for other disorder: Secondary | ICD-10-CM | POA: Diagnosis not present

## 2024-05-24 DIAGNOSIS — R7303 Prediabetes: Secondary | ICD-10-CM | POA: Diagnosis not present

## 2024-05-24 DIAGNOSIS — J383 Other diseases of vocal cords: Secondary | ICD-10-CM | POA: Diagnosis not present

## 2024-05-24 DIAGNOSIS — G4733 Obstructive sleep apnea (adult) (pediatric): Secondary | ICD-10-CM | POA: Diagnosis not present

## 2024-05-24 DIAGNOSIS — Z0001 Encounter for general adult medical examination with abnormal findings: Secondary | ICD-10-CM | POA: Diagnosis not present

## 2024-05-24 DIAGNOSIS — Z0000 Encounter for general adult medical examination without abnormal findings: Secondary | ICD-10-CM | POA: Diagnosis not present

## 2024-05-24 DIAGNOSIS — Z1331 Encounter for screening for depression: Secondary | ICD-10-CM | POA: Diagnosis not present

## 2024-05-24 DIAGNOSIS — Z23 Encounter for immunization: Secondary | ICD-10-CM | POA: Diagnosis not present

## 2024-05-27 ENCOUNTER — Other Ambulatory Visit (HOSPITAL_COMMUNITY): Payer: Self-pay

## 2024-05-27 NOTE — Telephone Encounter (Signed)
 Will attempt to enroll pt into copay card program, however Nucala 's copay card program requires the plan information be entered at the time of enrollment. Pt's [employer] insurance info does not populate on eligibility check and there is no copy of the card in our system. Will need to contact pt or [facility] on Monday to obtain plan info.

## 2024-05-30 ENCOUNTER — Other Ambulatory Visit (HOSPITAL_COMMUNITY): Payer: Self-pay

## 2024-05-30 NOTE — Telephone Encounter (Signed)
 Contacted [facility] and obtained commercial prescription insurance information as we do not have it scanned into Epic. It is worth noting that [employer] current has pt's name as Jane Bennett and her DoB is [birth year] (pt is aware of this). Enrolled pt and consent email was sent.  Contacted pt and provided update, advised that once she receives the billing info for the copay card she will need to contact [facility] and provide it to them and ensure that they run it along with the [employer] plan and NOT the medicare plan. Pt verbalized understanding to all, direct phone number provided in case she runs into any additional issues.

## 2024-06-10 DIAGNOSIS — Z1231 Encounter for screening mammogram for malignant neoplasm of breast: Secondary | ICD-10-CM | POA: Diagnosis not present

## 2024-06-30 ENCOUNTER — Encounter (HOSPITAL_BASED_OUTPATIENT_CLINIC_OR_DEPARTMENT_OTHER): Payer: Self-pay | Admitting: Pulmonary Disease

## 2024-06-30 NOTE — Telephone Encounter (Signed)
 I see notes from you can you help with this?

## 2024-07-29 ENCOUNTER — Ambulatory Visit (INDEPENDENT_AMBULATORY_CARE_PROVIDER_SITE_OTHER): Admitting: Pulmonary Disease

## 2024-07-29 ENCOUNTER — Encounter (HOSPITAL_BASED_OUTPATIENT_CLINIC_OR_DEPARTMENT_OTHER): Payer: Self-pay | Admitting: Pulmonary Disease

## 2024-07-29 VITALS — BP 146/88 | HR 69 | Temp 98.7°F | Ht 62.0 in | Wt 195.0 lb

## 2024-07-29 DIAGNOSIS — J302 Other seasonal allergic rhinitis: Secondary | ICD-10-CM

## 2024-07-29 DIAGNOSIS — J8283 Eosinophilic asthma: Secondary | ICD-10-CM | POA: Diagnosis not present

## 2024-07-29 DIAGNOSIS — J4551 Severe persistent asthma with (acute) exacerbation: Secondary | ICD-10-CM | POA: Diagnosis not present

## 2024-07-29 DIAGNOSIS — G4733 Obstructive sleep apnea (adult) (pediatric): Secondary | ICD-10-CM | POA: Diagnosis not present

## 2024-07-29 MED ORDER — BREZTRI AEROSPHERE 160-9-4.8 MCG/ACT IN AERO: 2.0000 | INHALATION_SPRAY | Freq: Two times a day (BID) | RESPIRATORY_TRACT | 11 refills | Status: AC

## 2024-07-29 MED ORDER — PREDNISONE 10 MG PO TABS: ORAL_TABLET | ORAL | 0 refills | Status: AC

## 2024-07-29 NOTE — Progress Notes (Signed)
 Patient ID: Jane Bennett, female    DOB: 10-10-1965, 58 y.o.   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   Started Fasenra  May/June 2022. Has baseline inspiratory are expiratory wheeze and chronic cough. Last flare 08/2021 when she had covid. 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 Usually triggered by heat, humidity, allergens and illness.  10/16/22 One month ago she was diagnosed with influenza and treated with supportive care and steroid shot. She was seen again for persistent cough, shortness of breath and wheezing and given additional prednisone  taper which she has now completed. Using nebs less now and only needing rescue inhaler 3-4 times daily. Overall her asthma symptoms seem controlled on Fasenra  but has noticed more breakthrough symptoms prior to needing her next injection. Feels like her allergy medicine not improving. Compliant with CPAP nightly.   06/16/23 Since our last visit she has intentionally lost 40 lbs in the last 8 months. She reports her breathing has improved since then but does have some shortness of breath with activity. No dedicated exercise time but does short intervals of 10-30 min of housework, walking to chicken coop or going to the store or general walking. She can feel the Fasenra  is not as effective near the end of treatment. She reports night awakenings twice a night with productive cough associated with wheezing. Still uses albuterol  daily despite Breztri . Has not needed nebulizers. Has congestion seasonally that always worsens in the fall. She is  compliant with CPAP nightly and helps with her quality of sleep.  10/20/23 Since our last visit patient has been approved for Nucala  and presents for updated clinic appointment to file with her new insurance. Last exacerbation was treated on 08/12/23 with prednisone  and doxycycline . Her last Fasenra  injection was in August 2024. She was started on Nucala  on 07/08/23. She reports she has been doing well since initiation. Is having nasal congestion this month due to colder months. Still gets triggered by strong odors and heat and emotional stress. Overall no coughing or wheezing or chest tightness. Shortness of breath with activity and bending. She is able to manage household activities but no dedicated exercise. She is currently on Breztri  twice a day and Airsupra  2-3 times a week.  01/12/24 Since our last visit she has been doing overall well. No exacerbations since our last visit. Has been on Nucala  and Breztri . Denies cough or wheezing. Has shortness of breath with activity. Walks up to 5000 steps a day. Compliant with CPAP and reports benefit with use including good quality sleep and energy.  07/29/24 Since our last visit she has been doing well on her CPAP. Her asthma has been well controlled on Breztri  and has had no exacerbations since 07/2023. However two days ago she began having shortness of breath, cough and wheezing. Denies muscle aches, fevers, or chills. Has been using atrovent  for nasal congestion. Daughter had recent URI and patient has recently been around dust when preparing to sell her dad's house who passed away last year.   Questionaires / Pulmonary Flowsheets:   ACT:  Asthma Control Test ACT Total Score  07/29/2024 11:41 AM 14  10/20/2023  9:34 AM 15  06/16/2023  8:32 AM 10   2021 Jan Feb Mar April May June July Aug Sept Oct Nov Dec      X  X    X    2022 Jan Feb Mar April May June July Aug Sept Oct Nov Dec    X   O         2023 Jan Feb Mar April May June July Aug Sept Oct  Nov Dec        X   X   X  2024 Jan Feb Mar April May June July Aug Sept Oct Nov Dec   X         O X    2025 Jan Feb Mar April May June July Aug Sept Oct Nov Dec             X   2026 Jan Feb Mar April May June July Aug Sept Oct Nov Dec                2027 Jan Feb Mar April May June July Aug Sept Oct Nov Dec                  O = Started Fasenra  01/16/21-05/2023 O = Started Fasenra  07/08/23   Past Medical History:  Diagnosis Date   Asthma    DVT (deep venous thrombosis) (HCC)    High cholesterol    Hypertension    Pneumonia     Outpatient Encounter Medications as of 07/29/2024  Medication Sig   albuterol  (PROVENTIL ) (2.5 MG/3ML) 0.083% nebulizer solution Take 3 mLs (2.5 mg total) by nebulization every 6 (six) hours as needed for wheezing or shortness of breath.   albuterol  (VENTOLIN  HFA) 108 (90 Base) MCG/ACT inhaler INHALE 1 TO 2 PUFFS BY MOUTH EVERY 4 TO 6 HOURS   Albuterol -Budesonide (AIRSUPRA ) 90-80 MCG/ACT AERO Inhale 2 puffs into the lungs every 4 (four) hours as needed.   cetirizine  (EQ ALLERGY RELIEF, CETIRIZINE ,) 10 MG tablet Take 1 tablet by mouth once daily   EPINEPHrine  0.3 mg/0.3 mL IJ SOAJ injection INJECT CONTENTS OF 1 PEN AS NEEDED FOR ALLERGIC REACTION (ANAPHYLAXIS)   famotidine  (PEPCID ) 40 MG tablet Take 1 tablet (40 mg total) by mouth at bedtime.   fluticasone  (FLONASE ) 50 MCG/ACT nasal spray Place 1 spray into both nostrils daily.   gabapentin (NEURONTIN) 300 MG capsule Take 300 mg by mouth 3 (three) times daily.   ipratropium (ATROVENT ) 0.06 % nasal spray Place 2 sprays into the nose 4 (four) times daily.   losartan-hydrochlorothiazide (HYZAAR) 50-12.5 MG tablet Take 1 tablet by mouth daily.   Mepolizumab  (NUCALA ) 100 MG/ML SOAJ Inject 1 mL (100 mg total) into the skin every 28 (twenty-eight) days.   Omega-3 Fatty Acids (FISH OIL) 1000 MG CAPS Take by mouth.   omeprazole  (PRILOSEC) 40 MG capsule Take 1 capsule by mouth once daily   ondansetron (ZOFRAN-ODT) 8 MG  disintegrating tablet Take 8 mg by mouth every 6 (six) hours as needed.   predniSONE  (DELTASONE ) 10 MG tablet Take 4 tablets (40 mg total) by mouth daily with breakfast for 2 days, THEN 3 tablets (30 mg total) daily with breakfast for 2 days, THEN 2 tablets (20 mg total) daily with breakfast for 2 days, THEN 1 tablet (10 mg total) daily with breakfast for 2 days.   pseudoephedrine  (SUDAFED) 60 MG tablet Take 1 tablet (  60 mg total) by mouth every 6 (six) hours as needed for congestion.   rosuvastatin (CRESTOR) 10 MG tablet SMARTSIG:1 Tablet(s) By Mouth Every Evening   Spacer/Aero-Holding Chambers (EQ SPACE CHAMBER ANTI-STATIC) DEVI Inhale 1 each into the lungs as needed.   [DISCONTINUED] Budeson-Glycopyrrol-Formoterol (BREZTRI  AEROSPHERE) 160-9-4.8 MCG/ACT AERO Inhale 2 puffs into the lungs in the morning and at bedtime.   budesonide-glycopyrrolate-formoterol (BREZTRI  AEROSPHERE) 160-9-4.8 MCG/ACT AERO inhaler Inhale 2 puffs into the lungs in the morning and at bedtime.   [DISCONTINUED] predniSONE  (DELTASONE ) 10 MG tablet Take 40mg  daily for 3 days, then 30mg  daily for 3 days, then 20mg  daily for 3 days, then 10mg  daily for 3 days, then stop   No facility-administered encounter medications on file as of 07/29/2024.     Review of Systems Review of Systems  Constitutional:  Negative for chills, diaphoresis, fever, malaise/fatigue and weight loss.  HENT:  Negative for congestion.   Respiratory:  Positive for cough, sputum production, shortness of breath and wheezing. Negative for hemoptysis.   Cardiovascular:  Negative for chest pain, palpitations and leg swelling.    BP (!) 146/88   Pulse 69   Temp 98.7 F (37.1 C) (Oral)   Ht 5' 2 (1.575 m)   Wt 195 lb (88.5 kg)   SpO2 99%   BMI 35.67 kg/m   Physical Exam: General: Well-appearing, no acute distress HENT: [place], AT Eyes: EOMI, no scleral icterus Respiratory: Mild expiratory wheezing. Diminished to auscultation bilaterally.  No crackles,  wheezing or rales Cardiovascular: RRR, -M/R/G, no JVD Extremities:-Edema,-tenderness Neuro: AAO x4, CNII-XII grossly intact Psych: Normal mood, normal affect  Data Reviewed:  Imaging: CTA 10/11/19 - No pulmonary embolism. Normal parenchyma, no effusion or edema HRCT at [facility] 03/30/2020 (report only) - No evidence of interstitial lung disease. Mild diffuse central airway thickening which can be seen with asthma or bronchitis  PFTs: 01/06/2020 FVC 1.27 [39%], FEV1 1.06 [41%], F/F 84 TLC 3.97 [83%], DLCO 20.20 [104%] Interpretation: Normal spirometry however significant bronchodilator response with post-bronchodilator FEV1 increased by 25% and increased small airway disease response. No evidence of restrictive lung disease. Normal gas exchange  Labs: IgE 01/06/2020- 18 CBC 10/11/2019-WBC 8.7, eos 2%, absolute eosinophil count 200  09/15/2019-SARS-CoV-2-positive  05/30/2020-home sleep study-AHI 8.5, SaO2 low 75%   CPAP Compliance Report 06/26/24-07/25/24 Usage days 30/30 days (100%) >4 hours 30 days (100%) Avg usage 8h 37 min Auto CPAP 5-15, EPR 3 AHI 1  Assessment & Plan:   58 year old female never smoker with asthma and hx of vocal cord dysfunction secondary to covid-19 now resolved who presents for asthma follow-up. Hold on stepping down on inhalers since cost for Breztri  is the same as ICS/LABA. Discussed clinical course and management of asthma including bronchodilator regimen, preventive care including vaccinations and action plan for exacerbation.  Severe eosinophilia persistent asthma - currently in exacerbation. Uncontrolled --CONTINUE Nucala  as scheduled. Started 06/2023 --CONTINUE Breztri  160-9-4.5 mcg TWO puffs TWICE daily with aerochamber --CONTINUE Airsupra  or Albuterol  TWO puffs every 4 hours AS NEEDED for shortness of breath or wheezing --Encourage regular aerobic activity including 7000-10,000 steps daily --Recommend influenza and pneumococcal vaccine. Hold until acute  illness resolves  Asthma exacerbation secondary to presumed viral URI --Prednisone  taper --Supportive care  Allergic Rhinitis - worsening --RESTART atrovent  nasal spray spray daily as needed --CONTINUE Flonase  nightly --CONTINUE cetirizine  10 mg daily. Ok to change to zyrtec  or allegra if not effective --Use Pseudoephedrine  60 mg every 6 hours as needed. Please use sparingly and caution  in setting of hypertension  --Continue saline rinses, nettie pot --Minimize outdoor exposure as much as possible  OSA - excellent compliance --Patient uses NIV for more than four hours nightly for at least 70% of nights during the last three months of usage. The patient has been using and benefiting from PAP use and will continue to benefit from therapy.  --Counseled on sleep hygiene --Counseled on weight loss/maintenance of healthy weight --Counseled NOT to drive if/when sleepy --Advised patient to wear CPAP for at least 4 hours each night for greater than 70% of the time to avoid the machine being repossessed by insurance.  Health Maintenance Immunization History  Administered Date(s) Administered   Influenza Whole 07/04/2019, 07/26/2020   No orders of the defined types were placed in this encounter.   Meds ordered this encounter  Medications   predniSONE  (DELTASONE ) 10 MG tablet    Sig: Take 4 tablets (40 mg total) by mouth daily with breakfast for 2 days, THEN 3 tablets (30 mg total) daily with breakfast for 2 days, THEN 2 tablets (20 mg total) daily with breakfast for 2 days, THEN 1 tablet (10 mg total) daily with breakfast for 2 days.    Dispense:  20 tablet    Refill:  0   budesonide-glycopyrrolate-formoterol (BREZTRI  AEROSPHERE) 160-9-4.8 MCG/ACT AERO inhaler    Sig: Inhale 2 puffs into the lungs in the morning and at bedtime.    Dispense:  10.7 g    Refill:  11    Lot Number?:   [id number]    Expiration Date?:   03/22/2022    Manufacturer?:   [employer] [[id number]    Quantity:   4    Return in about 6 months (around 01/26/2025).  Slater Staff, MD [facility] Pulmonary/Critical Care Medicine 07/29/2024 11:54 AM

## 2024-07-29 NOTE — Patient Instructions (Signed)
 Severe eosinophilia persistent asthma --CONTINUE Nucala  as scheduled. Started 06/2023 --CONTINUE Breztri  160-9-4.5 mcg TWO puffs TWICE daily with aerochamber --CONTINUE Airsupra  or Albuterol  TWO puffs every 4 hours AS NEEDED for shortness of breath or wheezing --Encourage regular aerobic activity including 7000-10,000 steps daily --Recommend influenza and pneumococcal vaccine. Hold until acute illness resolves  Asthma exacerbation secondary to presumed viral URI --Prednisone  taper --Supportive care  Allergic Rhinitis --CONTINUE atrovent  nasal spray spray daily as needed --CONTINUE Flonase  nightly --CONTINUE cetirizine  10 mg daily. Ok to change to zyrtec  or allegra if not effective --Use Pseudoephedrine  60 mg every 6 hours as needed. Please use sparingly and caution in setting of hypertension  --Continue saline rinses, nettie pot --Minimize outdoor exposure as much as possible

## 2024-08-07 ENCOUNTER — Other Ambulatory Visit: Payer: Self-pay | Admitting: Pulmonary Disease

## 2024-08-07 DIAGNOSIS — J383 Other diseases of vocal cords: Secondary | ICD-10-CM

## 2024-08-07 DIAGNOSIS — K219 Gastro-esophageal reflux disease without esophagitis: Secondary | ICD-10-CM

## 2024-08-08 NOTE — Telephone Encounter (Signed)
 Dr. Kassie, please advise as omeprazole  was not mentioned in LOV notes. Thank you!

## 2024-10-11 ENCOUNTER — Other Ambulatory Visit: Payer: Self-pay | Admitting: Pulmonary Disease

## 2024-10-11 DIAGNOSIS — J302 Other seasonal allergic rhinitis: Secondary | ICD-10-CM
